# Patient Record
Sex: Male | Born: 1960
Health system: Southern US, Community
[De-identification: ages and names within clinical notes are randomized; demographics above are authoritative.]

## PROBLEM LIST (undated history)

## (undated) DIAGNOSIS — K56609 Unspecified intestinal obstruction, unspecified as to partial versus complete obstruction: Secondary | ICD-10-CM

## (undated) DIAGNOSIS — M1990 Unspecified osteoarthritis, unspecified site: Secondary | ICD-10-CM

## (undated) DIAGNOSIS — I499 Cardiac arrhythmia, unspecified: Secondary | ICD-10-CM

## (undated) DIAGNOSIS — I10 Essential (primary) hypertension: Secondary | ICD-10-CM

## (undated) DIAGNOSIS — J449 Chronic obstructive pulmonary disease, unspecified: Secondary | ICD-10-CM

## (undated) DIAGNOSIS — E119 Type 2 diabetes mellitus without complications: Secondary | ICD-10-CM

## (undated) DIAGNOSIS — F32A Depression, unspecified: Secondary | ICD-10-CM

## (undated) DIAGNOSIS — C61 Malignant neoplasm of prostate: Secondary | ICD-10-CM

## (undated) DIAGNOSIS — I4891 Unspecified atrial fibrillation: Secondary | ICD-10-CM

## (undated) DIAGNOSIS — F329 Major depressive disorder, single episode, unspecified: Secondary | ICD-10-CM

## (undated) HISTORY — PX: EXPLORATORY LAPAROTOMY: SUR591

## (undated) HISTORY — PX: HAND SURGERY: SHX662

## (undated) HISTORY — PX: WRIST SURGERY: SHX841

## (undated) HISTORY — PX: HERNIA REPAIR: SHX51

---

## 2016-03-16 ENCOUNTER — Ambulatory Visit (INDEPENDENT_AMBULATORY_CARE_PROVIDER_SITE_OTHER): Payer: BLUE CROSS/BLUE SHIELD | Admitting: Urology

## 2016-03-16 DIAGNOSIS — N403 Nodular prostate with lower urinary tract symptoms: Secondary | ICD-10-CM | POA: Diagnosis not present

## 2016-03-16 DIAGNOSIS — N3281 Overactive bladder: Secondary | ICD-10-CM

## 2016-03-19 ENCOUNTER — Other Ambulatory Visit: Payer: Self-pay | Admitting: Urology

## 2016-03-19 DIAGNOSIS — N138 Other obstructive and reflux uropathy: Secondary | ICD-10-CM

## 2016-03-19 DIAGNOSIS — N403 Nodular prostate with lower urinary tract symptoms: Secondary | ICD-10-CM

## 2016-03-26 ENCOUNTER — Emergency Department (HOSPITAL_COMMUNITY): Payer: BLUE CROSS/BLUE SHIELD

## 2016-03-26 ENCOUNTER — Emergency Department (HOSPITAL_COMMUNITY)
Admission: EM | Admit: 2016-03-26 | Discharge: 2016-03-26 | Disposition: A | Discharge status: Home / Self Care | Payer: BLUE CROSS/BLUE SHIELD | Attending: Emergency Medicine | Admitting: Emergency Medicine

## 2016-03-26 ENCOUNTER — Encounter (HOSPITAL_COMMUNITY): Payer: Self-pay | Admitting: *Deleted

## 2016-03-26 DIAGNOSIS — E119 Type 2 diabetes mellitus without complications: Secondary | ICD-10-CM | POA: Insufficient documentation

## 2016-03-26 DIAGNOSIS — J441 Chronic obstructive pulmonary disease with (acute) exacerbation: Secondary | ICD-10-CM | POA: Insufficient documentation

## 2016-03-26 DIAGNOSIS — Z7901 Long term (current) use of anticoagulants: Secondary | ICD-10-CM | POA: Insufficient documentation

## 2016-03-26 DIAGNOSIS — Z7984 Long term (current) use of oral hypoglycemic drugs: Secondary | ICD-10-CM | POA: Diagnosis not present

## 2016-03-26 DIAGNOSIS — Z79899 Other long term (current) drug therapy: Secondary | ICD-10-CM | POA: Diagnosis not present

## 2016-03-26 DIAGNOSIS — Z87891 Personal history of nicotine dependence: Secondary | ICD-10-CM | POA: Insufficient documentation

## 2016-03-26 DIAGNOSIS — I10 Essential (primary) hypertension: Secondary | ICD-10-CM | POA: Diagnosis not present

## 2016-03-26 DIAGNOSIS — R0602 Shortness of breath: Secondary | ICD-10-CM | POA: Diagnosis present

## 2016-03-26 HISTORY — DX: Type 2 diabetes mellitus without complications: E11.9

## 2016-03-26 HISTORY — DX: Essential (primary) hypertension: I10

## 2016-03-26 HISTORY — DX: Chronic obstructive pulmonary disease, unspecified: J44.9

## 2016-03-26 LAB — BASIC METABOLIC PANEL
ANION GAP: 4 — AB (ref 5–15)
BUN: 11 mg/dL (ref 6–20)
CALCIUM: 8.7 mg/dL — AB (ref 8.9–10.3)
CO2: 35 mmol/L — ABNORMAL HIGH (ref 22–32)
Chloride: 97 mmol/L — ABNORMAL LOW (ref 101–111)
Creatinine, Ser: 0.77 mg/dL (ref 0.61–1.24)
Glucose, Bld: 139 mg/dL — ABNORMAL HIGH (ref 65–99)
POTASSIUM: 4.3 mmol/L (ref 3.5–5.1)
Sodium: 136 mmol/L (ref 135–145)

## 2016-03-26 LAB — CBC WITH DIFFERENTIAL/PLATELET
BASOS ABS: 0 10*3/uL (ref 0.0–0.1)
BASOS PCT: 0 %
EOS ABS: 0.1 10*3/uL (ref 0.0–0.7)
Eosinophils Relative: 2 %
HCT: 46.6 % (ref 39.0–52.0)
HEMOGLOBIN: 15.3 g/dL (ref 13.0–17.0)
Lymphocytes Relative: 52 %
Lymphs Abs: 3 10*3/uL (ref 0.7–4.0)
MCH: 29.6 pg (ref 26.0–34.0)
MCHC: 32.8 g/dL (ref 30.0–36.0)
MCV: 90.1 fL (ref 78.0–100.0)
Monocytes Absolute: 0.4 10*3/uL (ref 0.1–1.0)
Monocytes Relative: 6 %
NEUTROS ABS: 2.3 10*3/uL (ref 1.7–7.7)
NEUTROS PCT: 40 %
Platelets: 107 10*3/uL — ABNORMAL LOW (ref 150–400)
RBC: 5.17 MIL/uL (ref 4.22–5.81)
RDW: 15.3 % (ref 11.5–15.5)
WBC: 5.9 10*3/uL (ref 4.0–10.5)

## 2016-03-26 LAB — PROTIME-INR
INR: 2.12
PROTHROMBIN TIME: 24.1 s — AB (ref 11.4–15.2)

## 2016-03-26 LAB — TROPONIN I: Troponin I: <0.03 ng/mL (ref <0.03)

## 2016-03-26 MED ORDER — ALBUTEROL SULFATE (2.5 MG/3ML) 0.083% IN NEBU
10.0000 mg | INHALATION_SOLUTION | Freq: Once | RESPIRATORY_TRACT | Status: AC
Start: 2016-03-26 — End: 2016-03-26
  Administered 2016-03-26: 10 mg via RESPIRATORY_TRACT

## 2016-03-26 MED ORDER — METHYLPREDNISOLONE SODIUM SUCC 125 MG IJ SOLR
125.0000 mg | Freq: Once | INTRAMUSCULAR | Status: AC
  Administered 2016-03-26: 125 mg via INTRAVENOUS
  Filled 2016-03-26: qty 2

## 2016-03-26 MED ORDER — PREDNISONE 20 MG PO TABS: 40.0000 mg | ORAL_TABLET | Freq: Every day | ORAL | 0 refills | Status: DC

## 2016-03-26 MED ORDER — ALBUTEROL (5 MG/ML) CONTINUOUS INHALATION SOLN: 10.0000 mg/h | INHALATION_SOLUTION | Freq: Once | RESPIRATORY_TRACT | Status: DC

## 2016-03-26 MED ORDER — ALBUTEROL SULFATE (2.5 MG/3ML) 0.083% IN NEBU
INHALATION_SOLUTION | RESPIRATORY_TRACT | Status: AC
  Administered 2016-03-26: 10 mg via RESPIRATORY_TRACT
  Filled 2016-03-26: qty 12

## 2016-03-26 MED ORDER — IPRATROPIUM BROMIDE 0.02 % IN SOLN
1.0000 mg | Freq: Once | RESPIRATORY_TRACT | Status: AC
  Administered 2016-03-26: 1 mg via RESPIRATORY_TRACT
  Filled 2016-03-26: qty 5

## 2016-03-26 NOTE — ED Notes (Signed)
Pt is refusing chest x-ray. 

## 2016-03-26 NOTE — ED Triage Notes (Addendum)
Pt c/o SOB that has been worsening over the last few days. Pt has hx of COPD. Also c/o congested cough. PT reports the SOB causes him to have panic attacks.Pt's O2 sat 88% on RA in triage. Pt was seen at [facility] ED last night and he said he left on his own without being d/c.

## 2016-03-26 NOTE — ED Notes (Signed)
Dr Mcmanus at bedside 

## 2016-03-26 NOTE — ED Notes (Addendum)
Pt anxious. Asking how long before neb will be completed. States wants papers ready so he can be discharged. Pt informed that would be up to dr regarding . Sat 90 % on room air. Conts to refuse CXR stated he had one last night, but doesn't know results

## 2016-03-26 NOTE — ED Provider Notes (Signed)
[facility] DEPT Provider Note   CSN: [id number] Arrival date & time: 03/26/16  [id number]     History   Chief Complaint Chief Complaint  Patient presents with  . Shortness of Breath    HPI Dale Franklin is a 56 y.o. male.   Shortness of Breath     Pt was seen at 0950.  Per pt, c/o gradual onset and worsening of persistent cough, wheezing and SOB for the past 1 week.  Describes his symptoms as "my COPD is acting up."  Has been using home MDI with transient relief.  Denies CP/palpitations, no back pain, no abd pain, no N/V/D, no fevers, no rash.    Past Medical History:  Diagnosis Date  . COPD (chronic obstructive pulmonary disease) ([place])   . Diabetes mellitus without complication ([place])   . Hypertension     There are no active problems to display for this patient.   Past Surgical History:  Procedure Laterality Date  . HAND SURGERY Right   . HERNIA REPAIR    . WRIST SURGERY Right        Home Medications    Prior to Admission medications   Medication Sig Start Date End Date Taking? Authorizing Provider  clotrimazole-betamethasone (LOTRISONE) cream Apply 1 application topically 2 (two) times daily. 03/23/16  Yes Historical Provider, MD  DULoxetine (CYMBALTA) 30 MG capsule Take 3 capsules by mouth daily.  03/23/16  Yes Historical Provider, MD  lisinopril-hydrochlorothiazide (PRINZIDE,ZESTORETIC) 10-12.5 MG tablet Take 1 tablet by mouth daily. 03/23/16  Yes Historical Provider, MD  Melatonin 3 MG TABS Take 1 tablet by mouth at bedtime. 03/04/16  Yes Historical Provider, MD  metFORMIN (GLUCOPHAGE-XR) 500 MG 24 hr tablet Take 1 tablet by mouth 2 (two) times daily. 03/23/16  Yes Historical Provider, MD  methocarbamol (ROBAXIN) 500 MG tablet Take 500 mg by mouth at bedtime. 03/23/16  Yes Historical Provider, MD  metoprolol succinate (TOPROL-XL) 25 MG 24 hr tablet Take 1 tablet by mouth daily. 03/23/16  Yes Historical Provider, MD  mirabegron ER (MYRBETRIQ) 25 MG TB24 tablet Take 25 mg  by mouth daily.   Yes Historical Provider, MD  Saw Palmetto, Serenoa repens, (SAW PALMETTO PO) Take 1,200 mg by mouth daily. 01/16/16  Yes Historical Provider, MD  tamsulosin (FLOMAX) 0.4 MG CAPS capsule Take 0.4 mg by mouth at bedtime. 03/23/16  Yes Historical Provider, MD  traZODone (DESYREL) 100 MG tablet Take 1 tablet by mouth at bedtime. 03/23/16  Yes Historical Provider, MD  VENTOLIN HFA 108 (90 Base) MCG/ACT inhaler Inhale 1-2 puffs into the lungs every 4 (four) hours as needed. 03/03/16  Yes Historical Provider, MD  warfarin (COUMADIN) 10 MG tablet Take 1 tablet by mouth daily. 03/23/16  Yes Historical Provider, MD  warfarin (COUMADIN) 2.5 MG tablet Take 1 tablet by mouth daily. 03/23/16  Yes Historical Provider, MD  levofloxacin (LEVAQUIN) 500 MG tablet Take 500 mg by mouth daily. 03/23/16   Historical Provider, MD    Family History No family history on file.  Social History Social History  Substance Use Topics  . Smoking status: Former Research scientist (life sciences)  . Smokeless tobacco: Never Used  . Alcohol use Yes     Comment: occasionally     Allergies   Patient has no known allergies.   Review of Systems Review of Systems  Respiratory: Positive for shortness of breath.   ROS: Statement: All systems negative except as marked or noted in the HPI; Constitutional: Negative for fever and chills. ; ; Eyes: Negative for  eye pain, redness and discharge. ; ; ENMT: Negative for ear pain, hoarseness, nasal congestion, sinus pressure and sore throat. ; ; Cardiovascular: Negative for chest pain, palpitations, diaphoresis, and peripheral edema. ; ; Respiratory: +cough, wheezing, SOB. Negative for stridor. ; ; Gastrointestinal: Negative for nausea, vomiting, diarrhea, abdominal pain, blood in stool, hematemesis, jaundice and rectal bleeding. . ; ; Genitourinary: Negative for dysuria, flank pain and hematuria. ; ; Musculoskeletal: Negative for back pain and neck pain. Negative for swelling and trauma.; ; Skin: Negative for  pruritus, rash, abrasions, blisters, bruising and skin lesion.; ; Neuro: Negative for headache, lightheadedness and neck stiffness. Negative for weakness, altered level of consciousness, altered mental status, extremity weakness, paresthesias, involuntary movement, seizure and syncope.        Physical Exam Updated Vital Signs BP (!) 171/114   Pulse 95   Temp 97.7 F (36.5 C) (Oral)   Resp 26   Ht 6\' 3"  (1.905 m)   Wt (!) 330 lb (149.7 kg)   SpO2 94%   BMI 41.25 kg/m    Patient Vitals for the past 24 hrs:  BP Temp Temp src Pulse Resp SpO2 Height Weight  03/26/16 1115 - - - - 26 - - -  03/26/16 1100 (!) 171/114 - - 95 17 94 % - -  03/26/16 1035 - - - - - 94 % - -  03/26/16 1030 (!) 164/110 - - 91 15 94 % - -  03/26/16 0953 - 97.7 F (36.5 C) Oral - - - - -  03/26/16 0951 (!) 182/116 - - 96 24 (!) 88 % - -  03/26/16 0945 (!) 182/116 - - 96 16 (!) 88 % - -  03/26/16 0940 - - - - - - 6\' 3"  (1.905 m) (!) 330 lb (149.7 kg)     Physical Exam 0955: Physical examination:  Nursing notes reviewed; Vital signs and O2 SAT reviewed;  Constitutional: Well developed, Well nourished, Well hydrated, Uncomfortable appearing.;; Head:  Normocephalic, atraumatic; Eyes: EOMI, PERRL, No scleral icterus; ENMT: Mouth and pharynx normal, Mucous membranes moist; Neck: Supple, Full range of motion, No lymphadenopathy; Cardiovascular: Tachycardic rate and rhythm, No gallop; Respiratory: Breath sounds diminished & equal bilaterally, insp/exp wheezes bilat. Speaking short sentences, sitting upright, tachypneic.; Chest: Nontender, Movement normal; Abdomen: Soft, Nontender, Nondistended, Normal bowel sounds; Genitourinary: No CVA tenderness; Extremities: Pulses normal, No tenderness, No edema, No calf edema or asymmetry.; Neuro: AA&Ox3, Major CN grossly intact.  Speech clear. No gross focal motor or sensory deficits in extremities.; Skin: Color normal, Warm, Dry.   ED Treatments / Results  Labs (all labs  ordered are listed, but only abnormal results are displayed)   EKG  EKG Interpretation  Date/Time:  Friday March 26 2016 09:48:21 EST Ventricular Rate:  96 PR Interval:    QRS Duration: 151 QT Interval:  425 QTC Calculation: 526 R Axis:   73 Text Interpretation:  Atrial flutter Right bundle branch block ST depr, consider ischemia, inferior leads Baseline wander No old tracing to compare Confirmed by [facility]  MD, Nunzio Cory [phone]) on 03/26/2016 11:33:50 AM       Radiology   Procedures Procedures (including critical care time)  Medications Ordered in ED Medications  methylPREDNISolone sodium succinate (SOLU-MEDROL) 125 mg/2 mL injection 125 mg (125 mg Intravenous Given 03/26/16 1037)  ipratropium (ATROVENT) nebulizer solution 1 mg (1 mg Nebulization Given 03/26/16 1036)  albuterol (PROVENTIL) (2.5 MG/3ML) 0.083% nebulizer solution 10 mg (10 mg Nebulization Given 03/26/16 1035)  Initial Impression / Assessment and Plan / ED Course  I have reviewed the triage vital signs and the nursing notes.  Pertinent labs & imaging results that were available during my care of the patient were reviewed by me and considered in my medical decision making (see chart for details).  MDM Reviewed: nursing note and vitals Interpretation: labs, ECG and x-ray Total time providing critical care: 30-74 minutes. This excludes time spent performing separately reportable procedures and services.   CRITICAL CARE Performed by: Alfonzo Feller Total critical care time: 35 minutes Critical care time was exclusive of separately billable procedures and treating other patients. Critical care was necessary to treat or prevent imminent or life-threatening deterioration. Critical care was time spent personally by me on the following activities: development of treatment plan with patient and/or surrogate as well as nursing, discussions with consultants, evaluation of patient's response to treatment,  examination of patient, obtaining history from patient or surrogate, ordering and performing treatments and interventions, ordering and review of laboratory studies, ordering and review of radiographic studies, pulse oximetry and re-evaluation of patient's condition.  Results for orders placed or performed during the hospital encounter of [id number]  Basic metabolic panel  Result Value Ref Range   Sodium 136 135 - 145 mmol/L   Potassium 4.3 3.5 - 5.1 mmol/L   Chloride 97 (L) 101 - 111 mmol/L   CO2 35 (H) 22 - 32 mmol/L   Glucose, Bld 139 (H) 65 - 99 mg/dL   BUN 11 6 - 20 mg/dL   Creatinine, Ser 0.77 0.61 - 1.24 mg/dL   Calcium 8.7 (L) 8.9 - 10.3 mg/dL   GFR calc non Af Amer >60 >60 mL/min   GFR calc Af Amer >60 >60 mL/min   Anion gap 4 (L) 5 - 15  Troponin I  Result Value Ref Range   Troponin I <0.03 <0.03 ng/mL  CBC with Differential  Result Value Ref Range   WBC 5.9 4.0 - 10.5 K/uL   RBC 5.17 4.22 - 5.81 MIL/uL   Hemoglobin 15.3 13.0 - 17.0 g/dL   HCT 46.6 39.0 - 52.0 %   MCV 90.1 78.0 - 100.0 fL   MCH 29.6 26.0 - 34.0 pg   MCHC 32.8 30.0 - 36.0 g/dL   RDW 15.3 11.5 - 15.5 %   Platelets 107 (L) 150 - 400 K/uL   Neutrophils Relative % 40 %   Neutro Abs 2.3 1.7 - 7.7 K/uL   Lymphocytes Relative 52 %   Lymphs Abs 3.0 0.7 - 4.0 K/uL   Monocytes Relative 6 %   Monocytes Absolute 0.4 0.1 - 1.0 K/uL   Eosinophils Relative 2 %   Eosinophils Absolute 0.1 0.0 - 0.7 K/uL   Basophils Relative 0 %   Basophils Absolute 0.0 0.0 - 0.1 K/uL    1130:  On arrival: pt sitting upright, tachypneic, tachycardic, Sats 88 % R/A, lungs diminished. IV solumedrol and hour long neb started. After neb: pt sitting off side of stretcher, coughing, Sats 90% on R/A. Pt now refusing any further care in the ED and wants to leave.  Now also reveals to ED staff that he was at [facility] ED yesterday for this same complaint "and had stuff done there but I left there too." Unclear if he was rx prednisone.  Pt does state he has "enough" MDI.  Pt informed re: dx testing results and continued wheezing, and that I recommend further evaluation in the ED and admission.  Pt refuses any further  treatment in the ED, as well admission, and wants to "leave right now."  ED RN and I encouraged pt to stay, continues to refuse.  Pt makes his own medical decisions.  Risks of AMA explained to pt, including, but not limited to:  stroke, heart attack, cardiac arrythmia ("irregular heart rate/beat"), "passing out," temporary and/or permanent disability, death.  Pt verb understanding and continues to refuse any further care in the ED, as well as possible admission, understanding the consequences of his decision.  I encouraged pt to follow up with his PMD tomorrow and return to the ED immediately if symptoms worsen, he changes his mind, or for any other concerns.  Pt verb understanding, agreeable.   Final Clinical Impressions(s) / ED Diagnoses   Final diagnoses:  None    New Prescriptions New Prescriptions   No medications on file      Francine Graven, DO 03/30/16 [id number]

## 2016-03-26 NOTE — ED Notes (Signed)
Pt reports that he is leaving and refuses any other treatment. States he is 56 yo and can leave if he wants too

## 2016-03-26 NOTE — Discharge Instructions (Signed)
Take the prescription as directed.  Use your albuterol inhaler (2 to 4 puffs) or your albuterol nebulizer (1 unit dose) every 4 hours for the next 7 days, then as needed for cough, wheezing, or shortness of breath.  Call your regular medical doctor this morning to schedule a follow up appointment within the next 1 days.  Return to the Emergency Department immediately sooner if worsening.

## 2016-04-06 ENCOUNTER — Ambulatory Visit (HOSPITAL_COMMUNITY)
Admission: RE | Admit: 2016-04-06 | Discharge: 2016-04-06 | Disposition: A | Discharge status: Home / Self Care | Payer: BLUE CROSS/BLUE SHIELD | Source: Ambulatory Visit | Attending: Urology | Admitting: Urology

## 2016-04-06 DIAGNOSIS — N403 Nodular prostate with lower urinary tract symptoms: Secondary | ICD-10-CM

## 2016-04-06 DIAGNOSIS — C61 Malignant neoplasm of prostate: Secondary | ICD-10-CM | POA: Diagnosis present

## 2016-04-06 DIAGNOSIS — R972 Elevated prostate specific antigen [PSA]: Secondary | ICD-10-CM | POA: Diagnosis not present

## 2016-04-06 MED ORDER — LIDOCAINE HCL (PF) 2 % IJ SOLN
INTRAMUSCULAR | Status: AC
  Administered 2016-04-06: 10 mL
  Filled 2016-04-06: qty 10

## 2016-04-06 MED ORDER — GENTAMICIN SULFATE 40 MG/ML IJ SOLN
INTRAMUSCULAR | Status: AC
  Filled 2016-04-06: qty 4

## 2016-04-06 MED ORDER — LIDOCAINE HCL (PF) 2 % IJ SOLN
10.0000 mL | Freq: Once | INTRAMUSCULAR | Status: AC
  Administered 2016-04-06: 10 mL

## 2016-04-06 MED ORDER — GENTAMICIN SULFATE 40 MG/ML IJ SOLN
160.0000 mg | Freq: Once | INTRAMUSCULAR | Status: AC
  Administered 2016-04-06: 160 mg via INTRAMUSCULAR

## 2016-04-06 NOTE — Discharge Instructions (Signed)
Transrectal Ultrasound-Guided Biopsy °A transrectal ultrasound-guided biopsy is a procedure to take samples of tissue from your prostate. Ultrasound images are used to guide the procedure. It is usually done to check the prostate gland for cancer. °What happens before the procedure? °· Do not eat or drink after midnight on the night before your procedure. °· Take medicines as your doctor tells you. °· Your doctor may have you stop taking some medicines 5-7 days before the procedure. °· You will be given an enema before your procedure. During an enema, a liquid is put into your butt (rectum) to clear out waste. °· You may have lab tests the day of your procedure. °· Make plans to have someone drive you home. °What happens during the procedure? °· You will be given medicine to help you relax before the procedure. An IV tube will be put into one of your veins. It will be used to give fluids and medicine. °· You will be given medicine to reduce the risk of infection (antibiotic). °· You will be placed on your side. °· A probe with gel will be put in your butt. This is used to take pictures of your prostate and the area around it. °· A medicine to numb the area is put into your prostate. °· A biopsy needle is then inserted and guided to your prostate. °· Samples of prostate tissue are taken. The needle is removed. °· The samples are sent to a lab to be checked. Results are usually back in 2-3 days. °What happens after the procedure? °· You will be taken to a room where you will be watched until you are doing okay. °· You may have some pain in the area around your butt. You will be given medicines for this. °· You may be able to go home the same day. Sometimes, an overnight stay in the hospital is needed. °This information is not intended to replace advice given to you by your health care provider. Make sure you discuss any questions you have with your health care provider. °Document Released: 01/20/2009 Document Revised:  07/10/2015 Document Reviewed: 09/20/2012 °Elsevier Interactive Patient Education © 2017 [employer] ° °

## 2016-04-09 ENCOUNTER — Other Ambulatory Visit (HOSPITAL_COMMUNITY)
Admission: AD | Admit: 2016-04-09 | Discharge: 2016-04-09 | Disposition: A | Discharge status: Home / Self Care | Payer: BLUE CROSS/BLUE SHIELD | Source: Other Acute Inpatient Hospital | Attending: Urology | Admitting: Urology

## 2016-04-09 ENCOUNTER — Ambulatory Visit (INDEPENDENT_AMBULATORY_CARE_PROVIDER_SITE_OTHER): Payer: BLUE CROSS/BLUE SHIELD | Admitting: Urology

## 2016-04-09 DIAGNOSIS — R3914 Feeling of incomplete bladder emptying: Secondary | ICD-10-CM

## 2016-04-09 DIAGNOSIS — C61 Malignant neoplasm of prostate: Secondary | ICD-10-CM | POA: Diagnosis not present

## 2016-04-09 LAB — URINALYSIS, ROUTINE W REFLEX MICROSCOPIC
Bilirubin Urine: NEGATIVE
GLUCOSE, UA: NEGATIVE mg/dL
Hgb urine dipstick: NEGATIVE
Ketones, ur: NEGATIVE mg/dL
LEUKOCYTES UA: NEGATIVE
Nitrite: NEGATIVE
PROTEIN: NEGATIVE mg/dL
Specific Gravity, Urine: 1.023 (ref 1.005–1.030)
pH: 5 (ref 5.0–8.0)

## 2016-04-11 LAB — URINE CULTURE: Culture: NO GROWTH

## 2016-04-13 ENCOUNTER — Other Ambulatory Visit: Payer: Self-pay | Admitting: Urology

## 2016-04-13 DIAGNOSIS — C61 Malignant neoplasm of prostate: Secondary | ICD-10-CM

## 2016-04-15 ENCOUNTER — Encounter (HOSPITAL_COMMUNITY)
Admission: RE | Admit: 2016-04-15 | Discharge: 2016-04-15 | Disposition: A | Discharge status: Home / Self Care | Payer: BLUE CROSS/BLUE SHIELD | Source: Ambulatory Visit | Attending: Urology | Admitting: Urology

## 2016-04-15 ENCOUNTER — Encounter (HOSPITAL_COMMUNITY): Payer: Self-pay

## 2016-04-15 DIAGNOSIS — C61 Malignant neoplasm of prostate: Secondary | ICD-10-CM | POA: Insufficient documentation

## 2016-04-15 MED ORDER — TECHNETIUM TC 99M MEDRONATE IV KIT
25.0000 | PACK | Freq: Once | INTRAVENOUS | Status: AC | PRN
  Administered 2016-04-15: 21 via INTRAVENOUS

## 2016-04-20 ENCOUNTER — Ambulatory Visit (HOSPITAL_COMMUNITY)
Admission: RE | Admit: 2016-04-20 | Discharge: 2016-04-20 | Disposition: A | Discharge status: Home / Self Care | Payer: BLUE CROSS/BLUE SHIELD | Source: Ambulatory Visit | Attending: Urology | Admitting: Urology

## 2016-04-20 DIAGNOSIS — I700 Atherosclerosis of aorta: Secondary | ICD-10-CM | POA: Diagnosis not present

## 2016-04-20 DIAGNOSIS — C61 Malignant neoplasm of prostate: Secondary | ICD-10-CM | POA: Insufficient documentation

## 2016-04-20 DIAGNOSIS — R9721 Rising PSA following treatment for malignant neoplasm of prostate: Secondary | ICD-10-CM | POA: Diagnosis not present

## 2016-04-20 DIAGNOSIS — K439 Ventral hernia without obstruction or gangrene: Secondary | ICD-10-CM | POA: Insufficient documentation

## 2016-04-20 DIAGNOSIS — R590 Localized enlarged lymph nodes: Secondary | ICD-10-CM | POA: Diagnosis not present

## 2016-04-20 MED ORDER — IOPAMIDOL (ISOVUE-300) INJECTION 61%
100.0000 mL | Freq: Once | INTRAVENOUS | Status: AC | PRN
  Administered 2016-04-20: 100 mL via INTRAVENOUS

## 2016-04-27 ENCOUNTER — Ambulatory Visit (INDEPENDENT_AMBULATORY_CARE_PROVIDER_SITE_OTHER): Payer: BLUE CROSS/BLUE SHIELD | Admitting: Urology

## 2016-04-27 DIAGNOSIS — C61 Malignant neoplasm of prostate: Secondary | ICD-10-CM

## 2016-04-27 DIAGNOSIS — R3914 Feeling of incomplete bladder emptying: Secondary | ICD-10-CM | POA: Diagnosis not present

## 2016-04-30 ENCOUNTER — Telehealth: Payer: Self-pay | Admitting: Medical Oncology

## 2016-04-30 NOTE — Progress Notes (Signed)
Left a message with Becky Augusta- friend requesting a return call to discuss patient's referral to the [facility].

## 2016-05-04 ENCOUNTER — Encounter: Payer: Self-pay | Admitting: Medical Oncology

## 2016-05-04 ENCOUNTER — Telehealth: Payer: Self-pay | Admitting: Medical Oncology

## 2016-05-04 NOTE — Progress Notes (Signed)
Called [facility] to request pathology slides for [facility]state [facility].

## 2016-05-04 NOTE — Progress Notes (Signed)
Spoke with Kim-friend of Mr. Perz. She states that she and her husband help him with medical appointments and offer support to him due to being disabled. I called pt to introduce myself as the Prostate Nurse Navigator and the Coordinator of the Prostate [place].   I confirmed  his referral to the clinic 05/11/16 arriving at 12:30 pm. I discussed the format of the clinic and the physicians he will be seeing that day. I discussed where the clinic is located and how to contact me.  I confirmed his address and informed him I would be mailing a packet of information and forms to be completed. I asked him to bring them with him the day of his appointment.   She voiced understanding of the above. I asked her to call me she  has any questions or concerns regarding his appointments or the forms he needs to complete. I will also call Mr. Demas to inform him of the above.

## 2016-05-04 NOTE — Progress Notes (Signed)
I called pt to introduce myself as the Prostate Nurse Navigator and the Coordinator of the Prostate [place].  1. I confirmed with the patient he is aware of his referral to the clinic March 27,2018 arriving at 12:30 pm.  2. I discussed the format of the clinic and the physicians he will be seeing that day.  3. I discussed where the clinic is located and how to contact me.  4. I confirmed his address and informed him I would be mailing a packet of information and forms to be completed. I asked him to bring them with him the day of his appointment.   He voiced understanding of the above. I asked him to call me if he has any questions or concerns regarding his appointments or the forms he needs to complete.

## 2016-05-10 ENCOUNTER — Telehealth: Payer: Self-pay | Admitting: Medical Oncology

## 2016-05-10 NOTE — Progress Notes (Signed)
Confirmed appointment for [facility]state [facility] 05/11/16 arriving at 12:30 pm. I reminded him to bring his completed medical forms and to have lunch before arrival. He voiced understanding.

## 2016-05-11 ENCOUNTER — Encounter: Payer: Self-pay | Admitting: Radiation Oncology

## 2016-05-11 ENCOUNTER — Ambulatory Visit (HOSPITAL_BASED_OUTPATIENT_CLINIC_OR_DEPARTMENT_OTHER): Payer: BLUE CROSS/BLUE SHIELD | Admitting: Oncology

## 2016-05-11 ENCOUNTER — Ambulatory Visit
Admission: RE | Admit: 2016-05-11 | Discharge: 2016-05-11 | Disposition: A | Discharge status: Home / Self Care | Payer: BLUE CROSS/BLUE SHIELD | Source: Ambulatory Visit | Attending: Radiation Oncology | Admitting: Radiation Oncology

## 2016-05-11 ENCOUNTER — Encounter: Payer: Self-pay | Admitting: General Practice

## 2016-05-11 ENCOUNTER — Encounter: Payer: Self-pay | Admitting: Medical Oncology

## 2016-05-11 DIAGNOSIS — F329 Major depressive disorder, single episode, unspecified: Secondary | ICD-10-CM | POA: Insufficient documentation

## 2016-05-11 DIAGNOSIS — Z8042 Family history of malignant neoplasm of prostate: Secondary | ICD-10-CM | POA: Insufficient documentation

## 2016-05-11 DIAGNOSIS — Z833 Family history of diabetes mellitus: Secondary | ICD-10-CM | POA: Diagnosis not present

## 2016-05-11 DIAGNOSIS — Z87891 Personal history of nicotine dependence: Secondary | ICD-10-CM | POA: Insufficient documentation

## 2016-05-11 DIAGNOSIS — I4891 Unspecified atrial fibrillation: Secondary | ICD-10-CM | POA: Diagnosis not present

## 2016-05-11 DIAGNOSIS — G4730 Sleep apnea, unspecified: Secondary | ICD-10-CM | POA: Diagnosis not present

## 2016-05-11 DIAGNOSIS — K219 Gastro-esophageal reflux disease without esophagitis: Secondary | ICD-10-CM | POA: Diagnosis not present

## 2016-05-11 DIAGNOSIS — I10 Essential (primary) hypertension: Secondary | ICD-10-CM | POA: Diagnosis not present

## 2016-05-11 DIAGNOSIS — Z7984 Long term (current) use of oral hypoglycemic drugs: Secondary | ICD-10-CM | POA: Insufficient documentation

## 2016-05-11 DIAGNOSIS — J449 Chronic obstructive pulmonary disease, unspecified: Secondary | ICD-10-CM | POA: Insufficient documentation

## 2016-05-11 DIAGNOSIS — R339 Retention of urine, unspecified: Secondary | ICD-10-CM | POA: Diagnosis not present

## 2016-05-11 DIAGNOSIS — E119 Type 2 diabetes mellitus without complications: Secondary | ICD-10-CM | POA: Diagnosis not present

## 2016-05-11 DIAGNOSIS — C61 Malignant neoplasm of prostate: Secondary | ICD-10-CM | POA: Insufficient documentation

## 2016-05-11 DIAGNOSIS — Z7901 Long term (current) use of anticoagulants: Secondary | ICD-10-CM | POA: Insufficient documentation

## 2016-05-11 DIAGNOSIS — Z809 Family history of malignant neoplasm, unspecified: Secondary | ICD-10-CM | POA: Diagnosis not present

## 2016-05-11 HISTORY — DX: Malignant neoplasm of prostate: C61

## 2016-05-11 NOTE — Progress Notes (Signed)
GU Location of Tumor / Histology: prostatic adenocarcinoma  If Prostate Cancer, Gleason Score is (4 + 5) and PSA is (20.4) as of 03/16/16  Dale Franklin presented months ago with signs/symptoms of:    Biopsies of prostate (if applicable) revealed:    Past/Anticipated interventions by urology, if any: biopsy, catheter, taught to self catheterize, referred to [facility]  Past/Anticipated interventions by medical oncology, if any: no  Weight changes, if any: no  Bowel/Bladder complaints, if any: urinary retention, urinary leakage, urinary frequency, hard to postpone urination, dysuria, must strain to urinate, ED   Nausea/Vomiting, if any: no  Pain issues, if any:  no  SAFETY ISSUES:  Prior radiation? no  Pacemaker/ICD? no  Possible current pregnancy? no  Is the patient on methotrexate? no  Current Complaints / other details:  56 year old male. Divorced.   Surgery not recommended due to prior abdominal surgery and obesity.

## 2016-05-11 NOTE — Progress Notes (Signed)
Reason for Referral: Prostate cancer.   HPI: 56 year old gentleman currently of [place], [place] where he lived the majority of his life. He has history of obesity, COPD, diabetes among other comorbid conditions. He has been disabled after he sustained a fall from a ladder. He has had history with urinary retention in the past and have had a Foley catheter placed on a few occasions. He was found to have a elevated PSA up to 20.4. He underwent a biopsy by Dr. Diona Fanti on February 2018. His prostate biopsy showed a Gleason score 4+5 = 9 in 4 cores with a Gleason score 4+4 = 8 in 3 other cores. He continued to be in retention after his biopsy and subsequently support catheter has been removed. He currently intermittently catheterized symptoms himself. Staging workup including a bone scan and a CT scan did not really show clear-cut evidence of metastatic disease. He does have borderline enlarged lymph glands in the pelvic area although do not meet the criteria for pathological enlargement. His complaints is predominantly related to urinary retention and difficulty catheterizing himself. He has a lot of discomfort and pressure in the pelvic area and does not report any hematuria or dysuria. His mobility is limited because of arthritis as well as obesity in his performance status is rather limited.  He does not report any headaches, blurry vision, syncope or seizures. He does not report any fevers, chills or sweats. He does not report any cough, wheezing or hemoptysis. He does not report any nausea, vomiting or abdominal pain. He is not report any frequency urgency or hesitancy. He has not reported any skeletal complaints. Remaining review of systems unremarkable.   Past Medical History:  Diagnosis Date  . COPD (chronic obstructive pulmonary disease) ([place])   . Diabetes mellitus without complication ([place])   . Hypertension   . Prostate cancer [facility])   :  Past Surgical History:  Procedure Laterality Date   . HAND SURGERY Right   . HERNIA REPAIR    . WRIST SURGERY Right   :   Current Outpatient Prescriptions:  .  amoxicillin-clavulanate (AUGMENTIN) 500-125 MG tablet, Take 1 tablet by mouth every 12 (twelve) hours., Disp: , Rfl: 0 .  clotrimazole-betamethasone (LOTRISONE) cream, Apply 1 application topically 2 (two) times daily., Disp: , Rfl: 0 .  DULoxetine (CYMBALTA) 30 MG capsule, Take 3 capsules by mouth daily. , Disp: , Rfl: 5 .  lisinopril-hydrochlorothiazide (PRINZIDE,ZESTORETIC) 10-12.5 MG tablet, Take 1 tablet by mouth daily., Disp: , Rfl: 12 .  Melatonin 3 MG TABS, Take 1 tablet by mouth at bedtime., Disp: , Rfl: 3 .  metFORMIN (GLUCOPHAGE-XR) 500 MG 24 hr tablet, Take 1 tablet by mouth 2 (two) times daily., Disp: , Rfl: 3 .  methocarbamol (ROBAXIN) 500 MG tablet, Take 500 mg by mouth at bedtime., Disp: , Rfl: 3 .  metoprolol succinate (TOPROL-XL) 25 MG 24 hr tablet, Take 1 tablet by mouth daily., Disp: , Rfl: 3 .  mirabegron ER (MYRBETRIQ) 25 MG TB24 tablet, Take 25 mg by mouth daily., Disp: , Rfl:  .  oxybutynin (DITROPAN) 5 MG tablet, TAKE ONE TABLET BY MOUTH EVERY 8 HOURS AS NEEDED FOR FOR BLADDER SPASMS AND BURNING., Disp: , Rfl: 11 .  predniSONE (DELTASONE) 20 MG tablet, Take 2 tablets (40 mg total) by mouth daily., Disp: 10 tablet, Rfl: 0 .  Saw Palmetto, Serenoa repens, (SAW PALMETTO PO), Take 1,200 mg by mouth daily., Disp: , Rfl: 3 .  tamsulosin (FLOMAX) 0.4 MG CAPS capsule, Take  0.4 mg by mouth at bedtime., Disp: , Rfl: 5 .  traZODone (DESYREL) 100 MG tablet, Take 1 tablet by mouth at bedtime., Disp: , Rfl: 3 .  VENTOLIN HFA 108 (90 Base) MCG/ACT inhaler, Inhale 1-2 puffs into the lungs every 4 (four) hours as needed., Disp: , Rfl: 1:  No Known Allergies:  Family History  Problem Relation Age of Onset  . Diabetes Mother   . Cancer Father   . Cancer Paternal Uncle     prostate  :  Social History   Social History  . Marital status: Divorced    Spouse name: N/A   . Number of children: N/A  . Years of education: N/A   Occupational History  . Not on file.   Social History Main Topics  . Smoking status: Former Research scientist (life sciences)  . Smokeless tobacco: Never Used  . Alcohol use Yes     Comment: occasionally  . Drug use: No  . Sexual activity: Not on file   Other Topics Concern  . Not on file   Social History Narrative  . No narrative on file  :  Pertinent items are noted in HPI.  Exam: ECOG 2 General appearance: Alert, awake obese gentleman appeared without distress. Throat: No oral thrush or ulcers. Neck: no adenopathy Back: negative Resp: clear to auscultation bilaterally Chest wall: no tenderness Cardio: regular rate and rhythm, S1, S2 normal, no murmur, click, rub or gallop GI: soft, non-tender; bowel sounds normal; no masses,  no organomegaly Extremities: extremities normal, atraumatic, no cyanosis or edema Pulses: 2+ and symmetric  *  Ct Pelvis W Contrast  Result Date: 04/20/2016 CLINICAL DATA:  New diagnosis of prostate cancer. Difficulty with urination. EXAM: CT PELVIS WITH CONTRAST TECHNIQUE: Multidetector CT imaging of the pelvis was performed using the standard protocol following the bolus administration of intravenous contrast. CONTRAST:  177mL ISOVUE-300 IOPAMIDOL (ISOVUE-300) INJECTION 61% COMPARISON:  12/24/2015 FINDINGS: Urinary Tract: Urinary bladder is collapsed around a Foley catheter. Bowel: No pathologic dilatation of the small or large bowel loops. There are 2 ventral abdominal wall hernias which contain nonobstructed loops of small bowel, image 19 of series 2 and image 25 of series 2. Vascular/Lymphatic: Aortic atherosclerosis. There is a right common iliac lymph node which measures 11 mm, image 21 of series 2. Previously 9 mm. There is a right external iliac node which measures 8 mm, image number 35 of series 2. Previously this measured the same. Reproductive:  No mass or other significant abnormality Other:  None.  Musculoskeletal: No suspicious bone lesions identified. IMPRESSION: 1. Borderline enlarged right common iliac lymph node measures 11 mm. On the previous exam this measured 9 mm. 2. Ventral abdominal wall hernias are identified. Two of these contain nonobstructed loops of small bowel. 3. Aortic atherosclerosis Electronically Signed   By: Kerby Moors M.D.   On: 04/20/2016 09:59   Nm Bone Scan Whole Body  Result Date: 04/15/2016 CLINICAL DATA:  Recent diagnosis of prostate carcinoma, some right leg pain currently, previous trauma 2 years ago from a fall EXAM: [place]: Whole body anterior and posterior images were obtained approximately 3 hours after intravenous injection of radiopharmaceutical. RADIOPHARMACEUTICALS:  21.0 mCi Technetium-35m MDP IV COMPARISON:  None. FINDINGS: No abnormal uptake of the radionuclide is seen throughout the skeleton other than probable degenerative change within the right knee. Contamination is noted in the midline of the pelvis. IMPRESSION: No evidence of bone metastasis. Probable degenerative change in the right knee. Electronically  Signed   By: Ivar Drape M.D.   On: 04/15/2016 13:43    Assessment and Plan:   56 year old gentleman with prostate cancer diagnosed on a biopsy in February 2018. His Gleason score 4+5 = 9 which was present in at least 4 cores. 3 other cores had 4+4 = 8 prostate cancer. Imaging studies did not show any clear-cut cut evidence to suggest metastatic disease.  His case was discussed today the prostate cancer multidisciplinary clinic. Imaging studies were reviewed with radiology. His specimen was also discussed by the reviewing pathologist. Given the high risk features associated with his prostate cancer, definitive therapy is warranted and the immediate future. Primary surgical therapy would be a challenge given his comorbid conditions as well as body habitus with possible need for try modality therapy  afterwards. Given his risk of metastatic disease, curative therapy might be more challenging.  Alternatively, definitive radiation therapy with androgen deprivation may be a better option. Androgen deprivation therapy for his high risk disease will be at least for 18 months. Despite his low level of testosterone, I would recommend androgen deprivation therapy as recommended for high risk prostate cancer.  He might require a channel TURP prior to radiation therapy to relieve his urinary symptoms. ADT can be started at any time in preparation for definitive radiation therapy in the near future.

## 2016-05-11 NOTE — Progress Notes (Signed)
Radiation Oncology         [phone]) [phone] ________________________________  Multidisciplinary Prostate Cancer Clinic  Initial Radiation Oncology Consultation  Name: Dale Franklin MRN: [id number]  Date: 05/11/2016  DOB: 09/23/1960  WC:BJSEGB, Crissie Sickles, DO  Raynelle Bring, MD   REFERRING PHYSICIAN: Raynelle Bring, MD  DIAGNOSIS: 56 y.o. gentleman with stage T2c adenocarcinoma of the prostate with a Gleason's score of 4+5=9 and a PSA of 20.4.    ICD-9-CM ICD-10-CM   1. Prostate cancer ([place]) [place] is a 56 y.o. gentleman.  He was noted to have an elevated PSA of 20.4 by his primary care physician, Dr. Nadara Mustard.  Accordingly, he was referred for evaluation in urology by Dr. Diona Fanti on 03/16/16,  digital rectal examination was performed at that time revealing bilateral firmness/nodularity.  Total testosterone was noted to be 34 on 03/16/16 without ADT.  The patient proceeded to transrectal ultrasound with 12 biopsies of the prostate on 04/06/16.  The prostate volume measured 45 cc.  Out of 12 core biopsies,8 were positive.  The maximum Gleason score was 4+5=9, and this was seen in the right mid, right mid lateral, right apex and right apex lateral.  He also had 4+4=8 in the left base lateral, right base and right base lateral as well as 4+3=7 in the left apex.  Bone Scan 04/15/16 was negative for bone metastasis.  CT A&P 04/20/16: demonstrated borderline enlarged right common iliac lymph node measuring 11 mm. On the previous exam 12/2015 this measured 9 mm.  The patient reviewed the biopsy results with his urologist and he has kindly been referred today to the multidisciplinary prostate cancer clinic for presentation of pathology and radiology studies in our conference for discussion of potential radiation treatment options and clinical evaluation.   PREVIOUS RADIATION THERAPY: No  PAST MEDICAL HISTORY:  has a past medical history of COPD (chronic  obstructive pulmonary disease) ([place]); Diabetes mellitus without complication ([place]); Hypertension; and Prostate cancer ([place]).    PAST SURGICAL HISTORY: Past Surgical History:  Procedure Laterality Date  . HAND SURGERY Right   . HERNIA REPAIR    . WRIST SURGERY Right     FAMILY HISTORY: family history includes Cancer in his father and paternal uncle; Diabetes in his mother.  SOCIAL HISTORY:  reports that he has quit smoking. He has never used smokeless tobacco. He reports that he drinks alcohol. He reports that he does not use drugs.  ALLERGIES: Patient has no known allergies.  MEDICATIONS:  Current Outpatient Prescriptions  Medication Sig Dispense Refill  . amoxicillin-clavulanate (AUGMENTIN) 500-125 MG tablet Take 1 tablet by mouth every 12 (twelve) hours.  0  . clotrimazole-betamethasone (LOTRISONE) cream Apply 1 application topically 2 (two) times daily.  0  . DULoxetine (CYMBALTA) 30 MG capsule Take 3 capsules by mouth daily.   5  . lisinopril-hydrochlorothiazide (PRINZIDE,ZESTORETIC) 10-12.5 MG tablet Take 1 tablet by mouth daily.  12  . Melatonin 3 MG TABS Take 1 tablet by mouth at bedtime.  3  . metFORMIN (GLUCOPHAGE-XR) 500 MG 24 hr tablet Take 1 tablet by mouth 2 (two) times daily.  3  . methocarbamol (ROBAXIN) 500 MG tablet Take 500 mg by mouth at bedtime.  3  . metoprolol succinate (TOPROL-XL) 25 MG 24 hr tablet Take 1 tablet by mouth daily.  3  . mirabegron ER (MYRBETRIQ) 25 MG TB24 tablet Take 25 mg by mouth daily.    Mar[place] Kitchen oxybutynin (DITROPAN) 5 MG tablet  TAKE ONE TABLET BY MOUTH EVERY 8 HOURS AS NEEDED FOR FOR BLADDER SPASMS AND BURNING.  11  . predniSONE (DELTASONE) 20 MG tablet Take 2 tablets (40 mg total) by mouth daily. 10 tablet 0  . Saw Palmetto, Serenoa repens, (SAW PALMETTO PO) Take 1,200 mg by mouth daily.  3  . tamsulosin (FLOMAX) 0.4 MG CAPS capsule Take 0.4 mg by mouth at bedtime.  5  . traZODone (DESYREL) 100 MG tablet Take 1 tablet by mouth at bedtime.  3  .  VENTOLIN HFA 108 (90 Base) MCG/ACT inhaler Inhale 1-2 puffs into the lungs every 4 (four) hours as needed.  1   No current facility-administered medications for this encounter.     REVIEW OF SYSTEMS:  On review of systems, the patient reports that he is doing well overall. He denies any fevers, chills, night sweats, unintended weight changes. He endorses fatigue, loss of sleep, and SOB at rest. Pt endorses "prostate pain" described as throbbing. He endorses an irregular heartbeat, palpitations, poor circulation and chest pains. He denies any bowel disturbances, and denies abdominal pain, nausea or vomiting. Pt endorses poor appetite and hx of hernia. He endorses shoulder pain described as throbbing. Pt endorses arthritis. Pt endorses hx of diabetes. Pt endorses depression, anxiety, and phobias. His IPSS was 31, indicating severe urinary symptoms. He endorses dribbling urine, pain with urination, hx of UTIs, and blood in his urine. He had a recent episode of AUR s/p TRUSPBx and is currently having to use clean intermittent catheterization to empty his bladder.  He has completed 2 courses of antibiotics over the past several weeks but has been off abx for the past week.  He is almost never able to complete sexual activity with attempts. A complete review of systems is obtained and is otherwise negative.    PHYSICAL EXAM:  Wt Readings from Last 3 Encounters:  05/11/16 (!) 320 lb 12.8 oz (145.5 kg)  03/26/16 (!) 330 lb (149.7 kg)   Temp Readings from Last 3 Encounters:  05/11/16 97.6 F (36.4 C) (Oral)  04/06/16 97.6 F (36.4 C) (Oral)  03/26/16 97.7 F (36.5 C) (Oral)   BP Readings from Last 3 Encounters:  05/11/16 (!) 83/50  04/06/16 (!) 142/100  03/26/16 (!) 171/114   Pulse Readings from Last 3 Encounters:  05/11/16 (!) 129  04/06/16 100  03/26/16 95   In general this is a well appearing, obese male in no acute distress. He is alert and oriented x4 and appropriate throughout the  examination. HEENT reveals that the patient is normocephalic, atraumatic. EOMs are intact. PERRLA. Skin is intact without any evidence of gross lesions. Cardiovascular exam reveals a tachycardic rhythm that is regular, no clicks rubs or murmurs are auscultated. Chest is clear to auscultation bilaterally. Lymphatic assessment is performed and does not reveal any adenopathy in the cervical, supraclavicular, axillary, or inguinal chains. Abdomen has active bowel sounds in all quadrants and is intact. The abdomen is obese, soft, non tender, non distended. Lower extremities are negative for pretibial pitting edema, deep calf tenderness, cyanosis or clubbing.  KPS = 60  100 - Normal; no complaints; no evidence of disease. 90   - Able to carry on normal activity; minor signs or symptoms of disease. 80   - Normal activity with effort; some signs or symptoms of disease. 34   - Cares for self; unable to carry on normal activity or to do active work. 60   - Requires occasional assistance, but is able to  care for most of his personal needs. 50   - Requires considerable assistance and frequent medical care. 66   - Disabled; requires special care and assistance. 58   - Severely disabled; hospital admission is indicated although death not imminent. 75   - Very sick; hospital admission necessary; active supportive treatment necessary. 10   - Moribund; fatal processes progressing rapidly. 0     - Dead  Karnofsky DA, Abelmann [place], Craver LS and Burchenal [facility] [phone]) The use of the nitrogen mustards in the palliative treatment of carcinoma: with particular reference to bronchogenic carcinoma Cancer 1 [id number]   LABORATORY DATA:  Lab Results  Component Value Date   WBC 5.9 03/26/2016   HGB 15.3 03/26/2016   HCT 46.6 03/26/2016   MCV 90.1 03/26/2016   PLT 107 (L) 03/26/2016   Lab Results  Component Value Date   NA 136 03/26/2016   K 4.3 03/26/2016   CL 97 (L) 03/26/2016   CO2 35 (H) 03/26/2016   No results  found for: ALT, AST, GGT, ALKPHOS, BILITOT   RADIOGRAPHY: Ct Pelvis W Contrast  Result Date: 04/20/2016 CLINICAL DATA:  New diagnosis of prostate cancer. Difficulty with urination. EXAM: CT PELVIS WITH CONTRAST TECHNIQUE: Multidetector CT imaging of the pelvis was performed using the standard protocol following the bolus administration of intravenous contrast. CONTRAST:  176mL ISOVUE-300 IOPAMIDOL (ISOVUE-300) INJECTION 61% COMPARISON:  12/24/2015 FINDINGS: Urinary Tract: Urinary bladder is collapsed around a Foley catheter. Bowel: No pathologic dilatation of the small or large bowel loops. There are 2 ventral abdominal wall hernias which contain nonobstructed loops of small bowel, image 19 of series 2 and image 25 of series 2. Vascular/Lymphatic: Aortic atherosclerosis. There is a right common iliac lymph node which measures 11 mm, image 21 of series 2. Previously 9 mm. There is a right external iliac node which measures 8 mm, image number 35 of series 2. Previously this measured the same. Reproductive:  No mass or other significant abnormality Other:  None. Musculoskeletal: No suspicious bone lesions identified. IMPRESSION: 1. Borderline enlarged right common iliac lymph node measures 11 mm. On the previous exam this measured 9 mm. 2. Ventral abdominal wall hernias are identified. Two of these contain nonobstructed loops of small bowel. 3. Aortic atherosclerosis Electronically Signed   By: Kerby Moors M.D.   On: 04/20/2016 09:59   Nm Bone Scan Whole Body  Result Date: 04/15/2016 CLINICAL DATA:  Recent diagnosis of prostate carcinoma, some right leg pain currently, previous trauma 2 years ago from a fall EXAM: [place]: Whole body anterior and posterior images were obtained approximately 3 hours after intravenous injection of radiopharmaceutical. RADIOPHARMACEUTICALS:  21.0 mCi Technetium-15m MDP IV COMPARISON:  None. FINDINGS: No abnormal uptake of the radionuclide  is seen throughout the skeleton other than probable degenerative change within the right knee. Contamination is noted in the midline of the pelvis. IMPRESSION: No evidence of bone metastasis. Probable degenerative change in the right knee. Electronically Signed   By: Ivar Drape M.D.   On: 04/15/2016 13:43      IMPRESSION/PLAN: 56 y.o. gentleman with a high risk, stage T2c adenocarcinoma of the prostate with a PSA of 20 and a Gleason score of 4+5=9.  We discussed the patient's workup and outlines the nature of prostate cancer in this setting. The patient's T stage, Gleason's score, and PSA put him into the high risk group. Accordingly he is eligible for a variety of potential treatment options including external  radiation with LT ADTor external radiation and brachytherapy boost with LT ADT. We discussed the available radiation techniques, and focused on the details of logistics and delivery.  Given the patient's chronic BOO, he is not an ideal candidate for brachytherapy.  We discussed and outlined the risks, benefits, short and long-term effects associated with radiotherapy.   At the end of the conversation the patient is interested in moving forward with 8 weeks of external radiotherapy. He has not received his first Lupron injection, and has not had placement of gold fiducial markers. We will contact [facility] to make arrangements for start of ADT.  Due to the patient's bladder outlet obstruction, he will require TURP prior to radiotherapy. Once he has fully healed from this procedure, he will be scheduled for fiducial marker placement with [facility]. We will share our discussion with Dr. Dr. Diona Fanti and move forward with scheduling initiation of ADT.     Pt would prefer to have radiation treatments in [place], [place].  [facility] will need arrange for him to meet with Dr. Quitman Livings once he has recovered from his TURP to further discuss radiotherapy and scheduling of CT Simulation.   We spent 60  minutes face to face with the patient and more than 50% of that time was spent in counseling and/or coordination of care.     Nicholos Johns, PA-C  ------------------------------------------------   Tyler Pita, MD [place] Director and Director of Stereotactic Radiosurgery Direct Dial: [phone]  Fax: [phone] [email or web address]  Skype  [email or web address]   This document serves as a record of services personally performed by Tyler Pita, MD and Freeman Caldron, PA-C. It was created on their behalf by Linward Natal, a trained medical scribe. The creation of this record is based on the scribe's personal observations and the provider's statements to them. This document has been checked and approved by the attending provider.

## 2016-05-11 NOTE — Progress Notes (Signed)
                               Care Plan Summary  Name: Dale Franklin DOB: May 31, 1960   Your Medical Team:   Urologist -  Dr. Raynelle Bring, [facility]  Radiation Oncologist - Dr. Tyler Pita, [facility]   Medical Oncologist - Dr. Zola Button, [place]  Recommendations: 1) TURP  2) Androgen Deprivation (hormone injection) 3) Radiation  * These recommendations are based on information available as of today's consult.      Recommendations may change depending on the results of further tests or exams.  Next Steps: 1) Dr. Alan Ripper office will schedule hormone injections and TURP  2) Radiation in [place]. After healed from surgery Dr. Diona Fanti will refer to [facility] for radiation.  When appointments need to be scheduled, you will be contacted by [facility] and/or [facility].  Questions?  Please do not hesitate to call Cira Rue, RN, BSN, OCN at [phone] any questions or concerns.  Shirlean Mylar is your Oncology Nurse Navigator and is available to assist you while you're receiving your medical care at [facility].

## 2016-05-11 NOTE — Consult Note (Signed)
[facility]     05/11/2016   --------------------------------------------------------------------------------   Dale Franklin  MRN: [id number]  PRIMARY CARE:  Dale Lot. Nadara Mustard, MD  DOB: 19-Mar-1960, 56 year old Male  REFERRING:  Dale Lot. Nadara Mustard, MD  SSN: [id number]  PROVIDER:  Franchot Franklin, M.D.    TREATING:  Dale Franklin, M.D.    LOCATION:  [facility] [phone]   --------------------------------------------------------------------------------   CC/HPI: CC: Prostate Cancer   Physician requesting consult: Dr. Franchot Franklin  PCP: Dr. Rory Franklin  Location of consult: [facility] - Prostate Cancer Multidisciplinary [facility]   Dale Franklin is a 56 year old gentleman with multiple comorbid conditions including COPD, atrial fibrillation (managed with warfarin), asthma, depression, diabetes, GERD, hypertension, sleep apnea, and morbid obesity (332 lbs). He was seen in consultation in January 2018 by Dale Franklin for worsening lower urinary tract symptoms despite alpha blocker therapy. He was noted to have bilateral nodularity of the prostate at that time and his PSA was found to be 20.4. Furthermore, his serum testosterone has been noted to be 34 ng/dl at baseline. He underwent a TRUS biopsy of the prostate on 04/06/16 and was found to have Gleason 4+5=9 adenocarcinoma of the prostate with 8 out of 12 biopsy cores positive for malignancy. He did develop urinary retention following his biopsy and has required an indwelling catheter since his biopsy.   Family history: Uncle.   Imaging studies:  Bone scan (04/15/16): Negative for metastases.  CT scan (04/20/16): Two ventral hernias with non-obstructing bowel loops, aortic atherosclerosis, 11 mm right common iliac LN, 8 mm right external iliac LN   PMH: He has a history of hypertension, atrial fibrillation (warfarin), asthma, COPD with recent exacerbations requiring ED visits, depression,  diabetes, GERD, sleep apnea, and morbid obesity.  PSH: He underwent an exploratory surgery many years ago for what he describes as a "bleeding vessel". I'm unsure whether this was an AVM of the bowel or an aneurysm. Regardless, he since has undergone four ventral hernia repairs with mesh.   TNM stage: cT2c N0-1 Mx  PSA: 20.4  Gleason score: 4+5=9  Biopsy (04/06/16): 8/12 cores positive  Left: L lateral apex (10%, 4+3=7), L lateral base (10%, 4+4=8)  Right: R apex (80%, 4+5=9), R lateral apex (70%, 4+5=9), R mid (90%, 4+5=9), R lateral mid (80%, 4+5=9), R base (30%, 4+4=8, PNI), R lateral base (90%, 4+4=8, PNI)  Prostate volume: 45 cc   Nomogram  OC disease: 7%  EPE: 91%  SVI: 55%  LNI: 53%  PFS (5 year, 10 year): 15%,9%   Urinary function: IPSS is 31. He developed urinary retention after his biopsy. He is currently performing CIC.  Erectile function: SHIM score is 5. He has fairly severe erectile dysfunction.     ALLERGIES: None   MEDICATIONS: Augmentin 500 mg-125 mg tablet 1 tablet PO Q 12 H  Metformin Hcl  Metoprolol Succinate  Oxybutynin Chloride 5 mg tablet 1 tablet PO Q 8 H when necessary bladder spasms/burning bladder spasms  Tamsulosin Hcl  Warfarin Sodium  Duloxetine Hcl  Glipizide  Lisinopril-Hydrochlorothiazide  Melatonin  Methocarbamol  Saw Palmetto  Trazodone Hcl  Ventolin Hfa     GU PSH: Catheterize For Residual - 04/27/2016 Prostate Needle Biopsy - 04/06/2016    NON-GU PSH: Hernia Repair Surgical Pathology, Gross And Microscopic Examination For Prostate Needle - 04/06/2016    GU PMH: Prostate Cancer, High-risk prostate cancer with PSA of 20, 8/12 cores from recent biopsy revealing  mostly Gleason 4+5 = 9 pattern adenocarcinoma. One slightly abnormal lymph node and right iliac chain. On scan negative. 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 - 04/27/2016, I am going to get him schedule for a bone scan and CT pelvis for his Gleason  9 prostate cancer with a PSA of 20. , - 04/09/2016 Incomplete bladder emptying, A foley catheter was placed and he was discharged with leg bag drainage. - 04/09/2016 Elevated PSA - 04/06/2016 Nodular prostate w/ LUTS (Improving), He does have a firm prostate. I do not think he has significant obstruction, however. I do not know of prior PSA testing. - 03/16/2016 Overactive bladder, He has significant overactive bladder symptoms. He does seem to empty well. Urinalysis today is clear. - 03/16/2016    NON-GU PMH: Anxiety Arthritis Asthma Atrial Fibrillation Depression Diabetes Type 2 Duodenal ulcer, unspecified as acute or chronic, without hemorrhage or perforation GERD Hypertension Sleep Apnea    FAMILY HISTORY: Cancer - Father Diabetes - Mother Prostate Cancer - Uncle   SOCIAL HISTORY: Marital Status: Divorced Current Smoking Status: Patient does not smoke anymore. Has not smoked since 02/16/2015. Smoked for 30 years. Smoked 1 pack per day.   Tobacco Use Assessment Completed: Used Tobacco in last 30 days? Does drink.  Drinks 2 caffeinated drinks per day. Patient's occupation is/was Disabled.    REVIEW OF SYSTEMS:    GU Review Male:   Patient denies frequent urination, hard to postpone urination, burning/ pain with urination, get up at night to urinate, leakage of urine, stream starts and stops, trouble starting your streams, and have to strain to urinate .  Gastrointestinal (Upper):   Patient denies nausea and vomiting.  Gastrointestinal (Lower):   Patient denies diarrhea and constipation.  Constitutional:   Patient denies fever, night sweats, weight loss, and fatigue.  Skin:   Patient denies skin rash/ lesion and itching.  Eyes:   Patient denies blurred vision and double vision.  Ears/ Nose/ Throat:   Patient denies sore throat and sinus problems.  Hematologic/Lymphatic:   Patient denies swollen glands and easy bruising.  Cardiovascular:   Patient denies leg swelling and chest  pains.  Respiratory:   Patient denies cough and shortness of breath.  Endocrine:   Patient denies excessive thirst.  Musculoskeletal:   Patient denies back pain and joint pain.  Neurological:   Patient denies headaches and dizziness.  Psychologic:   Patient denies depression and anxiety.   VITAL SIGNS: None   MULTI-SYSTEM PHYSICAL EXAMINATION:    Constitutional: Well-nourished. No physical deformities. Normally developed. Good grooming.     PAST DATA REVIEWED:  Source Of History:  Patient  Lab Test Review:   PSA  Records Review:   Pathology Reports, Previous Patient Records   03/16/16  PSA  Total PSA 20.4     04/09/16  Hormones  Testosterone, Total 34 pg/dL    PROCEDURES: None   ASSESSMENT:      ICD-10 Details  1 GU:   Prostate Cancer - C61    PLAN:           Document Letter(s):  Created for Patient: Clinical Summary         Notes:   1. Prostate cancer: I had an in depth discussion with Mr. Grau and his friends today. We discussed his very high risk prostate cancer in the context of his significant medical comorbidities.   The patient was counseled about the natural history of prostate cancer and the standard treatment options  that are available for prostate cancer. It was explained to him how his age and life expectancy, clinical stage, Gleason score, and PSA affect his prognosis, the decision to proceed with additional staging studies, as well as how that information influences recommended treatment strategies. We discussed the roles for active surveillance, radiation therapy, surgical therapy, androgen deprivation, as well as ablative therapy options for the treatment of prostate cancer as appropriate to his individual cancer situation. We discussed the risks and benefits of these options with regard to their impact on cancer control and also in terms of potential adverse events, complications, and impact on quality of life particularly related to urinary and sexual  function. The patient was encouraged to ask questions throughout the discussion today and all questions were answered to his stated satisfaction. In addition, the patient was provided with and/or directed to appropriate resources and literature for further education about prostate cancer and treatment options.   Considering his age and urinary retention, we did discuss the option of surgical therapy. However, he has extremely high risk disease and questionable borderline lymph node involvement and would likely not be cured with surgery and almost definitely would require adjuvant or salvage therapy. Furthermore, he has extensive medical comorbidities that would make surgical therapy quite risky. He also would be unable to undergo a minimally invasive prostatectomy considering his multiple prior abdominal surgeries and ventral hernia mesh repairs. An open prostatectomy would also be considerably difficult due to his morbid obesity. I also do not think a perineal prostatectomy would be beneficial considering his high risk disease and inability to perform a lymphadenectomy.   It was therefore recommended that he proceed with primary radiation therapy in the form of intensity modulated radiation therapy. We also discussed his significantly low baseline testosterone level. Although he technically has castrate levels of serum testosterone, Dr. Alen Blew did recommend that he proceed with androgen deprivation therapy as this may still have a beneficial effect on his cancer. This will also help to ensure that his testosterone level remains castrate during treatment. He would require a minimum of 2 years ideally. It also may be beneficial to check a PSA prior to starting radiation therapy to see what effect androgen deprivation alone has had on his PSA. He will need to be scheduled for fiducial marker placement. I will plan to notify Dale Franklin of the recommendations from the multidisciplinary clinic.   2. Urinary  retention: We also discussed his urinary retention. He is not very pleased performing intermittent catheterization. We therefore discussed the option of possibly proceeding with a channel TURP prior to starting radiation therapy. 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    Cc: Dr. Rory Franklin  Dr. Franchot Franklin  Dr. Tyler Pita  Dr. Zola Button         E & M CODE: I spent at least 60 minutes face to face with the patient, more than 50% of that time was spent on counseling and/or coordinating care.

## 2016-05-11 NOTE — Progress Notes (Signed)
[facility] Psychosocial Distress Screening Spiritual Care  Met with Yochanan in [facility] to introduce [place] team/resources, reviewing distress screen per protocol.  The patient scored a [value unspecified] on the Psychosocial Distress Thermometer which indicates [unspecified] distress. Also assessed for distress and other psychosocial needs.   ONCBCN DISTRESS SCREENING 05/11/2016  Screening Type Initial Screening  Referral to support programs Yes   Mr Ambler was eager to complete clinic, so we met briefly.  Introduced [employer], providing full packet of print information.    Follow up needed: No.  Family/supporters also aware of ongoing Support Team availability, but please page if needs arise.  Thank you.   [place], [place], [facility] Pager [phone] Voicemail [phone]

## 2016-05-18 ENCOUNTER — Telehealth: Payer: Self-pay | Admitting: Medical Oncology

## 2016-05-18 NOTE — Progress Notes (Signed)
[facility] to discuss Mr. Dehner recent admission to [facility] for UTI and his request to scheduled TURP as soon as possible.  I was informed that Dr. Diona Fanti is on vacation this week and will return Monday. Dr. Diona Fanti wtill be informed of the above and they asked patient keep appointment 05/25/16 to discuss.

## 2016-05-18 NOTE — Progress Notes (Signed)
Kim-friend called to inform us that Mr. Sumler was hospitalized at [facility] 3/28-3/31 with a severe UTI. He was seen in the Prostate [facility] 3/27.18. He has  issues with urinary retention and having a TURP before radiation treatments was discussed during the clinic. He would like go proceed with surgery before he gets another infection. He has been doing self catheterizations. He has an appointment with Dr. Diona Fanti in [place]  05/25/16. She asked if we could get the TURP scheduled instead of seeing Dr. Diona Fanti next week. I explained that he should be receiving his androgen deprivation during this visit. I will call [facility] to inform Dr. Diona Fanti of recent hospitalization and request for TUPR as soon as possible. I will call her back with an update. She voiced understanding.

## 2016-05-20 ENCOUNTER — Telehealth: Payer: Self-pay | Admitting: Medical Oncology

## 2016-05-20 NOTE — Progress Notes (Signed)
I spoke with Dale Franklin to inform her I spoke with Dale Franklin office regarding patient being admitted at [facility] with UTI. I explained that Dale Franklin is out of the office this week and will be back on Monday. I did leave a detailed message for Dale Franklin so he is aware that patient would like to move forward with TURP. . I asked if Dale Franklin was given antibiotics on discharge and she states Cipro for a week. He has an appointment early next week with Dale Franklin which he will get his hormone injection and he can discuss the TURP. I asked her to call me back with questions or concerns. She thanked me for my efforts.

## 2016-05-25 ENCOUNTER — Ambulatory Visit (INDEPENDENT_AMBULATORY_CARE_PROVIDER_SITE_OTHER): Payer: BLUE CROSS/BLUE SHIELD | Admitting: Urology

## 2016-05-25 ENCOUNTER — Other Ambulatory Visit: Payer: Self-pay | Admitting: Urology

## 2016-05-25 DIAGNOSIS — N403 Nodular prostate with lower urinary tract symptoms: Secondary | ICD-10-CM | POA: Diagnosis not present

## 2016-05-25 DIAGNOSIS — R3914 Feeling of incomplete bladder emptying: Secondary | ICD-10-CM

## 2016-05-25 DIAGNOSIS — C61 Malignant neoplasm of prostate: Secondary | ICD-10-CM

## 2016-05-26 ENCOUNTER — Encounter: Payer: Self-pay | Admitting: *Deleted

## 2016-05-28 ENCOUNTER — Ambulatory Visit: Payer: BLUE CROSS/BLUE SHIELD | Admitting: Radiation Oncology

## 2016-05-28 ENCOUNTER — Encounter (HOSPITAL_COMMUNITY)
Admission: RE | Admit: 2016-05-28 | Discharge: 2016-05-28 | Disposition: A | Discharge status: Home / Self Care | Payer: BLUE CROSS/BLUE SHIELD | Source: Ambulatory Visit | Attending: Urology | Admitting: Urology

## 2016-05-28 ENCOUNTER — Encounter (HOSPITAL_COMMUNITY): Payer: Self-pay

## 2016-05-28 ENCOUNTER — Encounter (INDEPENDENT_AMBULATORY_CARE_PROVIDER_SITE_OTHER): Payer: Self-pay

## 2016-05-28 DIAGNOSIS — I4892 Unspecified atrial flutter: Secondary | ICD-10-CM | POA: Insufficient documentation

## 2016-05-28 DIAGNOSIS — I10 Essential (primary) hypertension: Secondary | ICD-10-CM | POA: Diagnosis not present

## 2016-05-28 DIAGNOSIS — C61 Malignant neoplasm of prostate: Secondary | ICD-10-CM | POA: Diagnosis not present

## 2016-05-28 DIAGNOSIS — Z01818 Encounter for other preprocedural examination: Secondary | ICD-10-CM | POA: Diagnosis not present

## 2016-05-28 DIAGNOSIS — I454 Nonspecific intraventricular block: Secondary | ICD-10-CM | POA: Diagnosis not present

## 2016-05-28 HISTORY — DX: Cardiac arrhythmia, unspecified: I49.9

## 2016-05-28 HISTORY — DX: Depression, unspecified: F32.A

## 2016-05-28 HISTORY — DX: Major depressive disorder, single episode, unspecified: F32.9

## 2016-05-28 HISTORY — DX: Unspecified osteoarthritis, unspecified site: M19.90

## 2016-05-28 LAB — CBC
HCT: 41.9 % (ref 39.0–52.0)
HEMOGLOBIN: 13.2 g/dL (ref 13.0–17.0)
MCH: 28 pg (ref 26.0–34.0)
MCHC: 31.5 g/dL (ref 30.0–36.0)
MCV: 89 fL (ref 78.0–100.0)
Platelets: 243 10*3/uL (ref 150–400)
RBC: 4.71 MIL/uL (ref 4.22–5.81)
RDW: 15.4 % (ref 11.5–15.5)
WBC: 8.7 10*3/uL (ref 4.0–10.5)

## 2016-05-28 LAB — BASIC METABOLIC PANEL
ANION GAP: 4 — AB (ref 5–15)
BUN: 21 mg/dL — ABNORMAL HIGH (ref 6–20)
CALCIUM: 9.7 mg/dL (ref 8.9–10.3)
CO2: 33 mmol/L — ABNORMAL HIGH (ref 22–32)
Chloride: 101 mmol/L (ref 101–111)
Creatinine, Ser: 1.11 mg/dL (ref 0.61–1.24)
GFR calc Af Amer: >60 mL/min (ref >60)
Glucose, Bld: 78 mg/dL (ref 65–99)
Potassium: 5.1 mmol/L (ref 3.5–5.1)
SODIUM: 138 mmol/L (ref 135–145)

## 2016-05-28 LAB — PROTIME-INR
INR: 1.54
PROTHROMBIN TIME: 18.7 s — AB (ref 11.4–15.2)

## 2016-05-28 LAB — GLUCOSE, CAPILLARY: GLUCOSE-CAPILLARY: 167 mg/dL — AB (ref 65–99)

## 2016-05-28 NOTE — Patient Instructions (Addendum)
Dale Franklin  05/28/2016   Your procedure is scheduled on: 05/31/16  Report to [facility] Main  Entrance Take [place]  elevators to 3rd floor to  [place] at 7:00 AM.    Call this number if you have problems the morning of surgery [phone]   Remember: ONLY 1 PERSON MAY GO WITH YOU TO SHORT STAY TO GET  READY MORNING OF [name].    Follow Dr. Alan Ripper instructions leading up to surgery.    Do not eat food or drink liquids :After Midnight.    Take these medicines the morning of surgery with A SIP OF WATER: duloxetine, metoprolol, mirabegron, ventolin inhaler if needed, Levofloxacin (instructions from Dr. Diona Fanti to take morning of your procedure), Nitrofurantoin.   How to Manage Your Diabetes Before and After Surgery  Why is it important to control my blood sugar before and after surgery? . Improving blood sugar levels before and after surgery helps healing and can limit problems. . A way of improving blood sugar control is eating a healthy diet by: o  Eating less sugar and carbohydrates o  Increasing activity/exercise o  Talking with your doctor about reaching your blood sugar goals . High blood sugars (greater than 180 mg/dL) can raise your risk of infections and slow your recovery, so you will need to focus on controlling your diabetes during the weeks before surgery. . Make sure that the doctor who takes care of your diabetes knows about your planned surgery including the date and location.  How do I manage my blood sugar before surgery? . Check your blood sugar at least 4 times a day, starting 2 days before surgery, to make sure that the level is not too high or low. o Check your blood sugar the morning of your surgery when you wake up and every 2 hours until you get to the Short Stay unit. . If your blood sugar is less than 70 mg/dL, you will need to treat for low blood sugar: o Do not take insulin. o Treat a low blood  sugar (less than 70 mg/dL) with  cup of clear juice (cranberry or apple), 4 glucose tablets, OR glucose gel. o Recheck blood sugar in 15 minutes after treatment (to make sure it is greater than 70 mg/dL). If your blood sugar is not greater than 70 mg/dL on recheck, call [phone] for further instructions. . Report your blood sugar to the short stay nurse when you get to Short Stay.  . If you are admitted to the hospital after surgery: o Your blood sugar will be checked by the staff and you will probably be given insulin after surgery (instead of oral diabetes medicines) to make sure you have good blood sugar levels. o The goal for blood sugar control after surgery is 80-180 mg/dL.   WHAT DO I DO ABOUT MY DIABETES MEDICATION?  Marland Kitchen Day before surgery: o Take Metformin as usual o Only take morning dose of Glipizide   . Morning of surgery o Do not take oral diabetes medicines (pills) the morning of surgery.   Patient Signature:  Date:   Nurse Signature:  Date:   Reviewed and Endorsed by [facility] Patient Education Committee, August 2015                               You may  not have any metal on your body including hair pins and              piercings  Do not wear jewelry, make-up, lotions, powders or perfumes, deodorant             Do not wear nail polish.  Do not shave  48 hours prior to surgery.              Men may shave face and neck.   Do not bring valuables to the hospital. [place].  Contacts, dentures or bridgework may not be worn into surgery.  Leave suitcase in the car. After surgery it may be brought to your room.               Please read over the following fact sheets you were given: _____________________________________________________________________             [facility] - Preparing for Surgery Before surgery, you can play an important role.  Because skin is not sterile, your skin needs to be as free of  germs as possible.  You can reduce the number of germs on your skin by washing with CHG (chlorahexidine gluconate) soap before surgery.  CHG is an antiseptic cleaner which kills germs and bonds with the skin to continue killing germs even after washing. Please DO NOT use if you have an allergy to CHG or antibacterial soaps.  If your skin becomes reddened/irritated stop using the CHG and inform your nurse when you arrive at Short Stay. Do not shave (including legs and underarms) for at least 48 hours prior to the first CHG shower.  You may shave your face/neck. Please follow these instructions carefully:  1.  Shower with CHG Soap the night before surgery and the  morning of Surgery.  2.  If you choose to wash your hair, wash your hair first as usual with your  normal  shampoo.  3.  After you shampoo, rinse your hair and body thoroughly to remove the  shampoo.                           4.  Use CHG as you would any other liquid soap.  You can apply chg directly  to the skin and wash                       Gently with a scrungie or clean washcloth.  5.  Apply the CHG Soap to your body ONLY FROM THE NECK DOWN.   Do not use on face/ open                           Wound or open sores. Avoid contact with eyes, ears mouth and genitals (private parts).                       Wash face,  Genitals (private parts) with your normal soap.             6.  Wash thoroughly, paying special attention to the area where your surgery  will be performed.  7.  Thoroughly rinse your body with warm water from the neck down.  8.  DO NOT shower/wash with your normal soap after using and rinsing off  the  CHG Soap.                9.  Pat yourself dry with a clean towel.            10.  Wear clean pajamas.            11.  Place clean sheets on your bed the night of your first shower and do not  sleep with pets. Day of Surgery : Do not apply any lotions/deodorants the morning of surgery.  Please wear clean clothes to the  hospital/surgery center.  FAILURE TO FOLLOW THESE INSTRUCTIONS MAY RESULT IN THE CANCELLATION OF YOUR SURGERY PATIENT SIGNATURE_________________________________  NURSE SIGNATURE__________________________________  ________________________________________________________________________

## 2016-05-28 NOTE — Pre-Procedure Instructions (Signed)
03-29-16 EKG (epic)

## 2016-05-28 NOTE — Progress Notes (Signed)
   05/28/16 1317  OBSTRUCTIVE SLEEP APNEA  Have you ever been diagnosed with sleep apnea through a sleep study? No  Do you snore loudly (loud enough to be heard through closed doors)?  0  Do you often feel tired, fatigued, or sleepy during the daytime (such as falling asleep during driving or talking to someone)? 0  Has anyone observed you stop breathing during your sleep? 0  Do you have, or are you being treated for high blood pressure? 1  BMI more than 35 kg/m2? 1  Age > 50 (1-yes) 1  Neck circumference greater than:Male 16 inches or larger, Male 17inches or larger? 1  Male Gender (Yes=1) 1  Obstructive Sleep Apnea Score 5

## 2016-05-28 NOTE — Pre-Procedure Instructions (Addendum)
Pt states last day he took coumadin was Wednesday May 26, 2016.  Holding for surgery.  Spoke with Dr. Nyoka Cowden regarding pt's EKG.  She instructed to evaluate pt upon admission.

## 2016-05-29 LAB — HEMOGLOBIN A1C
HEMOGLOBIN A1C: 6.5 % — AB (ref 4.8–5.6)
Mean Plasma Glucose: 140 mg/dL

## 2016-05-30 MED ORDER — DEXTROSE 5 % IV SOLN
3.0000 g | INTRAVENOUS | Status: AC
  Administered 2016-05-31: 3 g via INTRAVENOUS
  Filled 2016-05-30: qty 3
  Filled 2016-05-30: qty 3000

## 2016-05-31 ENCOUNTER — Encounter (HOSPITAL_COMMUNITY): Payer: Self-pay | Admitting: *Deleted

## 2016-05-31 ENCOUNTER — Ambulatory Visit (HOSPITAL_COMMUNITY): Payer: BLUE CROSS/BLUE SHIELD | Admitting: Anesthesiology

## 2016-05-31 ENCOUNTER — Ambulatory Visit (HOSPITAL_COMMUNITY)
Admission: RE | Admit: 2016-05-31 | Discharge: 2016-05-31 | Disposition: A | Discharge status: Home / Self Care | Payer: BLUE CROSS/BLUE SHIELD | Source: Ambulatory Visit | Attending: Urology | Admitting: Urology

## 2016-05-31 ENCOUNTER — Encounter (HOSPITAL_COMMUNITY)
Admission: RE | Disposition: A | Discharge status: Home / Self Care | Payer: Self-pay | Source: Ambulatory Visit | Attending: Urology

## 2016-05-31 ENCOUNTER — Observation Stay (HOSPITAL_COMMUNITY)
Admission: RE | Admit: 2016-05-31 | Discharge: 2016-06-01 | Disposition: A | Discharge status: Home / Self Care | Payer: BLUE CROSS/BLUE SHIELD | Source: Ambulatory Visit | Attending: Urology | Admitting: Urology

## 2016-05-31 DIAGNOSIS — J449 Chronic obstructive pulmonary disease, unspecified: Secondary | ICD-10-CM | POA: Diagnosis not present

## 2016-05-31 DIAGNOSIS — I10 Essential (primary) hypertension: Secondary | ICD-10-CM | POA: Diagnosis not present

## 2016-05-31 DIAGNOSIS — E119 Type 2 diabetes mellitus without complications: Secondary | ICD-10-CM | POA: Insufficient documentation

## 2016-05-31 DIAGNOSIS — C61 Malignant neoplasm of prostate: Secondary | ICD-10-CM

## 2016-05-31 DIAGNOSIS — Z7984 Long term (current) use of oral hypoglycemic drugs: Secondary | ICD-10-CM | POA: Insufficient documentation

## 2016-05-31 DIAGNOSIS — Z8546 Personal history of malignant neoplasm of prostate: Secondary | ICD-10-CM

## 2016-05-31 DIAGNOSIS — Z79899 Other long term (current) drug therapy: Secondary | ICD-10-CM | POA: Diagnosis not present

## 2016-05-31 DIAGNOSIS — N320 Bladder-neck obstruction: Secondary | ICD-10-CM

## 2016-05-31 DIAGNOSIS — F17210 Nicotine dependence, cigarettes, uncomplicated: Secondary | ICD-10-CM | POA: Insufficient documentation

## 2016-05-31 DIAGNOSIS — F329 Major depressive disorder, single episode, unspecified: Secondary | ICD-10-CM | POA: Insufficient documentation

## 2016-05-31 HISTORY — PX: GOLD SEED IMPLANT: SHX6343

## 2016-05-31 HISTORY — PX: TRANSURETHRAL RESECTION OF PROSTATE: SHX73

## 2016-05-31 LAB — APTT: aPTT: 35 seconds (ref 24–36)

## 2016-05-31 LAB — GLUCOSE, CAPILLARY
Glucose-Capillary: 128 mg/dL — ABNORMAL HIGH (ref 65–99)
Glucose-Capillary: 89 mg/dL (ref 65–99)
Glucose-Capillary: 190 mg/dL — ABNORMAL HIGH (ref 65–99)

## 2016-05-31 LAB — PROTIME-INR
INR: 1.77
PROTHROMBIN TIME: 20.9 s — AB (ref 11.4–15.2)

## 2016-05-31 SURGERY — TURP (TRANSURETHRAL RESECTION OF PROSTATE)
Anesthesia: General

## 2016-05-31 MED ORDER — HYDROMORPHONE HCL 2 MG/ML IJ SOLN
INTRAMUSCULAR | Status: AC
  Filled 2016-05-31: qty 1

## 2016-05-31 MED ORDER — URIBEL 118 MG PO CAPS: 1.0000 | ORAL_CAPSULE | Freq: Three times a day (TID) | ORAL | 0 refills | Status: DC | PRN

## 2016-05-31 MED ORDER — BELLADONNA ALKALOIDS-OPIUM 16.2-60 MG RE SUPP
1.0000 | Freq: Four times a day (QID) | RECTAL | Status: DC | PRN
  Administered 2016-05-31: 1 via RECTAL
  Filled 2016-05-31 (×2): qty 1

## 2016-05-31 MED ORDER — PROMETHAZINE HCL 25 MG/ML IJ SOLN: 6.2500 mg | INTRAMUSCULAR | Status: DC | PRN

## 2016-05-31 MED ORDER — OXYBUTYNIN CHLORIDE 5 MG PO TABS
5.0000 mg | ORAL_TABLET | Freq: Three times a day (TID) | ORAL | Status: DC | PRN
  Administered 2016-05-31: 5 mg via ORAL
  Filled 2016-05-31: qty 1

## 2016-05-31 MED ORDER — MIDAZOLAM HCL 2 MG/2ML IJ SOLN
INTRAMUSCULAR | Status: AC
  Filled 2016-05-31: qty 2

## 2016-05-31 MED ORDER — PROPOFOL 10 MG/ML IV BOLUS
INTRAVENOUS | Status: DC | PRN
  Administered 2016-05-31: 200 mg via INTRAVENOUS

## 2016-05-31 MED ORDER — MEPERIDINE HCL 50 MG/ML IJ SOLN: 6.2500 mg | INTRAMUSCULAR | Status: DC | PRN

## 2016-05-31 MED ORDER — ACETAMINOPHEN 325 MG PO TABS
ORAL_TABLET | ORAL | Status: AC
  Filled 2016-05-31: qty 2

## 2016-05-31 MED ORDER — OXYBUTYNIN CHLORIDE 5 MG PO TABS: 5.0000 mg | ORAL_TABLET | Freq: Once | ORAL | Status: DC

## 2016-05-31 MED ORDER — METHOCARBAMOL 500 MG PO TABS
250.0000 mg | ORAL_TABLET | Freq: Once | ORAL | Status: AC
  Administered 2016-05-31: 250 mg via ORAL

## 2016-05-31 MED ORDER — HYDROMORPHONE HCL 1 MG/ML IJ SOLN
0.2500 mg | INTRAMUSCULAR | Status: DC | PRN
  Administered 2016-05-31 (×2): 0.25 mg via INTRAVENOUS
  Administered 2016-05-31: 0.5 mg via INTRAVENOUS
  Administered 2016-05-31 (×2): 0.25 mg via INTRAVENOUS
  Administered 2016-05-31: 0.5 mg via INTRAVENOUS
  Filled 2016-05-31 (×7): qty 0.5

## 2016-05-31 MED ORDER — METHOCARBAMOL 500 MG PO TABS
ORAL_TABLET | ORAL | Status: AC
  Filled 2016-05-31: qty 1

## 2016-05-31 MED ORDER — LACTATED RINGERS IV SOLN
INTRAVENOUS | Status: DC
  Administered 2016-05-31: 21:00:00 via INTRAVENOUS

## 2016-05-31 MED ORDER — SODIUM CHLORIDE 0.9 % IR SOLN
Status: DC | PRN
  Administered 2016-05-31: 6000 mL

## 2016-05-31 MED ORDER — SENNA 8.6 MG PO TABS
1.0000 | ORAL_TABLET | Freq: Two times a day (BID) | ORAL | Status: DC
  Administered 2016-05-31: 8.6 mg via ORAL
  Filled 2016-05-31: qty 1

## 2016-05-31 MED ORDER — DEXMEDETOMIDINE HCL IN NACL 200 MCG/50ML IV SOLN
INTRAVENOUS | Status: DC | PRN
  Administered 2016-05-31 (×2): 12 ug via INTRAVENOUS
  Administered 2016-05-31: 8 ug via INTRAVENOUS

## 2016-05-31 MED ORDER — HYDROCODONE-ACETAMINOPHEN 5-325 MG PO TABS
1.0000 | ORAL_TABLET | ORAL | Status: DC | PRN
  Administered 2016-05-31 (×2): 2 via ORAL
  Filled 2016-05-31 (×2): qty 2

## 2016-05-31 MED ORDER — LIDOCAINE 2% (20 MG/ML) 5 ML SYRINGE
INTRAMUSCULAR | Status: DC | PRN
  Administered 2016-05-31: 60 mg via INTRAVENOUS

## 2016-05-31 MED ORDER — FENTANYL CITRATE (PF) 100 MCG/2ML IJ SOLN
INTRAMUSCULAR | Status: AC
  Filled 2016-05-31: qty 2

## 2016-05-31 MED ORDER — PROPOFOL 10 MG/ML IV BOLUS
INTRAVENOUS | Status: AC
  Filled 2016-05-31: qty 20

## 2016-05-31 MED ORDER — INSULIN ASPART 100 UNIT/ML ~~LOC~~ SOLN: 0.0000 [IU] | Freq: Every day | SUBCUTANEOUS | Status: DC

## 2016-05-31 MED ORDER — ACETAMINOPHEN 325 MG PO TABS
650.0000 mg | ORAL_TABLET | ORAL | Status: DC | PRN
  Administered 2016-05-31: 650 mg via ORAL

## 2016-05-31 MED ORDER — DULOXETINE HCL 60 MG PO CPEP: 90.0000 mg | ORAL_CAPSULE | Freq: Every day | ORAL | Status: DC

## 2016-05-31 MED ORDER — MIDAZOLAM HCL 5 MG/5ML IJ SOLN
INTRAMUSCULAR | Status: DC | PRN
  Administered 2016-05-31: 2 mg via INTRAVENOUS

## 2016-05-31 MED ORDER — METOPROLOL TARTRATE 5 MG/5ML IV SOLN
INTRAVENOUS | Status: DC | PRN
  Administered 2016-05-31: 1 mg via INTRAVENOUS
  Administered 2016-05-31: .5 mg via INTRAVENOUS
  Administered 2016-05-31: 1 mg via INTRAVENOUS
  Administered 2016-05-31: .5 mg via INTRAVENOUS
  Administered 2016-05-31: 1 mg via INTRAVENOUS

## 2016-05-31 MED ORDER — ALBUTEROL SULFATE (2.5 MG/3ML) 0.083% IN NEBU: 3.0000 mL | INHALATION_SOLUTION | Freq: Four times a day (QID) | RESPIRATORY_TRACT | Status: DC | PRN

## 2016-05-31 MED ORDER — ONDANSETRON HCL 4 MG/2ML IJ SOLN
INTRAMUSCULAR | Status: DC | PRN
  Administered 2016-05-31: 4 mg via INTRAVENOUS

## 2016-05-31 MED ORDER — TRAZODONE HCL 50 MG PO TABS
50.0000 mg | ORAL_TABLET | Freq: Every day | ORAL | Status: DC
  Administered 2016-05-31: 50 mg via ORAL
  Filled 2016-05-31: qty 1

## 2016-05-31 MED ORDER — HYDROCHLOROTHIAZIDE 10 MG/ML ORAL SUSPENSION
6.2500 mg | Freq: Every day | ORAL | Status: DC
  Filled 2016-05-31 (×2): qty 1.25

## 2016-05-31 MED ORDER — HYDROMORPHONE HCL 2 MG/ML IJ SOLN
1.0000 mg | Freq: Once | INTRAMUSCULAR | Status: AC
  Administered 2016-05-31: 1 mg via INTRAVENOUS

## 2016-05-31 MED ORDER — LACTATED RINGERS IV SOLN: INTRAVENOUS | Status: DC

## 2016-05-31 MED ORDER — LISINOPRIL 5 MG PO TABS: 5.0000 mg | ORAL_TABLET | Freq: Every day | ORAL | Status: DC

## 2016-05-31 MED ORDER — METHOCARBAMOL 500 MG PO TABS
500.0000 mg | ORAL_TABLET | Freq: Every day | ORAL | Status: DC
  Administered 2016-05-31: 500 mg via ORAL
  Filled 2016-05-31: qty 1

## 2016-05-31 MED ORDER — PHENYLEPHRINE 40 MCG/ML (10ML) SYRINGE FOR IV PUSH (FOR BLOOD PRESSURE SUPPORT)
PREFILLED_SYRINGE | INTRAVENOUS | Status: DC | PRN
  Administered 2016-05-31: 80 ug via INTRAVENOUS

## 2016-05-31 MED ORDER — INSULIN ASPART 100 UNIT/ML ~~LOC~~ SOLN: 0.0000 [IU] | Freq: Three times a day (TID) | SUBCUTANEOUS | Status: DC

## 2016-05-31 MED ORDER — LISINOPRIL-HYDROCHLOROTHIAZIDE 10-12.5 MG PO TABS: 0.5000 | ORAL_TABLET | Freq: Every day | ORAL | Status: DC

## 2016-05-31 MED ORDER — FENTANYL CITRATE (PF) 100 MCG/2ML IJ SOLN
INTRAMUSCULAR | Status: DC | PRN
  Administered 2016-05-31 (×3): 50 ug via INTRAVENOUS
  Administered 2016-05-31: 100 ug via INTRAVENOUS

## 2016-05-31 MED ORDER — DEXMEDETOMIDINE HCL IN NACL 200 MCG/50ML IV SOLN
INTRAVENOUS | Status: AC
  Filled 2016-05-31: qty 50

## 2016-05-31 MED ORDER — HYDROCODONE-ACETAMINOPHEN 5-325 MG PO TABS: 1.0000 | ORAL_TABLET | ORAL | 0 refills | Status: DC | PRN

## 2016-05-31 MED ORDER — LACTATED RINGERS IV SOLN
INTRAVENOUS | Status: DC
  Administered 2016-05-31 (×2): via INTRAVENOUS

## 2016-05-31 MED ORDER — METOPROLOL SUCCINATE ER 25 MG PO TB24
25.0000 mg | ORAL_TABLET | Freq: Two times a day (BID) | ORAL | Status: DC
  Administered 2016-05-31: 25 mg via ORAL
  Filled 2016-05-31: qty 1

## 2016-05-31 MED ORDER — SULFAMETHOXAZOLE-TRIMETHOPRIM 800-160 MG PO TABS
1.0000 | ORAL_TABLET | Freq: Two times a day (BID) | ORAL | Status: DC
  Administered 2016-05-31 – 2016-06-01 (×3): 1 via ORAL
  Filled 2016-05-31 (×3): qty 1

## 2016-05-31 MED ORDER — ONDANSETRON HCL 4 MG/2ML IJ SOLN: 4.0000 mg | INTRAMUSCULAR | Status: DC | PRN

## 2016-05-31 SURGICAL SUPPLY — 22 items
BAG URINE DRAINAGE (UROLOGICAL SUPPLIES) IMPLANT
BAG URO CATCHER STRL LF (MISCELLANEOUS) ×2 IMPLANT
BLADE SURG 15 STRL LF DISP TIS (BLADE) IMPLANT
BLADE SURG 15 STRL SS (BLADE)
CATH FOLEY 3WAY 30CC 22FR (CATHETERS) IMPLANT
CATH HEMA 3WAY 30CC 22FR COUDE (CATHETERS) ×2 IMPLANT
COVER SURGICAL LIGHT HANDLE (MISCELLANEOUS) ×2 IMPLANT
ELECT REM PT RETURN 15FT ADLT (MISCELLANEOUS) ×2 IMPLANT
EVACUATOR MICROVAS BLADDER (UROLOGICAL SUPPLIES) ×2 IMPLANT
GLOVE BIOGEL M 8.0 STRL (GLOVE) ×2 IMPLANT
GOWN STRL REUS W/ TWL XL LVL3 (GOWN DISPOSABLE) ×1 IMPLANT
GOWN STRL REUS W/TWL XL LVL3 (GOWN DISPOSABLE) ×3 IMPLANT
HOLDER FOLEY CATH W/STRAP (MISCELLANEOUS) IMPLANT
LOOP CUT BIPOLAR 24F LRG (ELECTROSURGICAL) ×2 IMPLANT
MANIFOLD NEPTUNE II (INSTRUMENTS) ×2 IMPLANT
MARKER GOLD PRELOAD 1.2X3 (Urological Implant) ×3 IMPLANT
PACK CYSTO (CUSTOM PROCEDURE TRAY) ×2 IMPLANT
SEED GOLD PRELOAD 1.2X3 (Urological Implant) ×6 IMPLANT
SET ASPIRATION TUBING (TUBING) ×2 IMPLANT
SUT ETHILON 3 0 PS 1 (SUTURE) IMPLANT
SYR 30ML LL (SYRINGE) IMPLANT
TUBING CONNECTING 10 (TUBING) ×2 IMPLANT

## 2016-05-31 NOTE — Discharge Instructions (Signed)
Transurethral Resection of the Prostate  Care After  Refer to this sheet in the next few weeks. These discharge instructions provide you with general information on caring for yourself after you leave the hospital. Your caregiver may also give you specific instructions. Your treatment has been planned according to the most current medical practices available, but unavoidable complications sometimes occur. If you have any problems or questions after discharge, please call your caregiver.  HOME CARE INSTRUCTIONS   Medications  You may receive medicine for pain management. As your level of discomfort decreases, adjustments in your pain medicines may be made.   Take all medicines as directed.   You may be given a medicine (antibiotic) to kill germs following surgery. Finish all medicines. Let your caregiver know if you have any side effects or problems from the medicine.   If you are on aspirin or coumadin, it would be best not to restart the aspirin until the blood in the urine clears Hygiene  You can take a shower after surgery.   You should not take a bath while you still have the urethral catheter. Activity  You will be encouraged to get out of bed as much as possible and increase your activity level as tolerated.   Spend the first week in and around your home. For 3 weeks, avoid the following:   Straining.   Running.   Strenuous work.   Walks longer than a few blocks.   Riding for extended periods.   Sexual relations.   Do not lift heavy objects (more than 20 pounds) for at least 1 month. When lifting, use your arms instead of your abdominal muscles.   You will be encouraged to walk as tolerated. Do not exert yourself. Increase your activity level slowly. Remember that it is important to keep moving after an operation of any type. This cuts down on the possibility of developing blood clots.   Your caregiver will tell you when you can resume driving and light housework.  Discuss this at your first office visit after discharge. Diet  No special diet is ordered after a TURP. However, if you are on a special diet for another medical problem, it should be continued.   Normal fluid intake is usually recommended.   Avoid alcohol and caffeinated drinks for 2 weeks. They irritate the bladder. Decaffeinated drinks are okay.   Avoid spicy foods.  Bladder Function  For the first 10 days, empty the bladder whenever you feel a definite desire. Do not try to hold the urine for long periods of time.   Urinating once or twice a night even after you are healed is not uncommon.   You may see some recurrence of blood in the urine after discharge from the hospital. This usually happens within 2 weeks after the procedure.If this occurs, force fluids again as you did in the hospital and reduce your activity.  Bowel Function  You may experience some constipation after surgery. This can be minimized by increasing fluids and fiber in your diet. Drink enough water and fluids to keep your urine clear or pale yellow.   A stool softener may be prescribed for use at home. Do not strain to move your bowels.   If you are requiring increased pain medicine, it is important that you take stool softeners to prevent constipation. This will help to promote proper healing by reducing the need to strain to move your bowels.  Sexual Activity  Semen movement in the opposite direction and into  the bladder (retrograde ejaculation) may occur. Since the semen passes into the bladder, cloudy urine can occur the first time you urinate after intercourse. Or, you may not have an ejaculation during erection. Ask your caregiver when you can resume sexual activity. Retrograde ejaculation and reduced semen discharge should not reduce one's pleasure of intercourse.  Postoperative Visit  Arrange the date and time of your after surgery visit with your caregiver.  Return to Work  After your recovery is  complete, you will be able to return to work and resume all activities. Your caregiver will inform you when you can return to work.  Foley Catheter Care A soft, flexible tube (Foley catheter) may have been placed in your bladder to drain urine and fluid. Follow these instructions: Taking Care of the Catheter  Keep the area where the catheter leaves your body clean.   Attach the catheter to the leg so there is no tension on the catheter.   Keep the drainage bag below the level of the bladder, but keep it OFF the floor.   Do not take long soaking baths. Your caregiver will give instructions about showering.   Wash your hands before touching ANYTHING related to the catheter or bag.   Using mild soap and warm water on a washcloth:   Clean the area closest to the catheter insertion site using a circular motion around the catheter.   Clean the catheter itself by wiping AWAY from the insertion site for several inches down the tube.   NEVER wipe upward as this could sweep bacteria up into the urethra (tube in your body that normally drains the bladder) and cause infection.   Place a small amount of sterile lubricant at the tip of the penis where the catheter is entering.  Taking Care of the Drainage Bags  Two drainage bags may be taken home: a large overnight drainage bag, and a smaller leg bag which fits underneath clothing.   It is okay to wear the overnight bag at any time, but NEVER wear the smaller leg bag at night.   Keep the drainage bag well below the level of your bladder. This prevents backflow of urine into the bladder and allows the urine to drain freely.   Anchor the tubing to your leg to prevent pulling or tension on the catheter. Use tape or a leg strap provided by the hospital.   Empty the drainage bag when it is 1/2 to 3/4 full. Wash your hands before and after touching the bag.   Periodically check the tubing for kinks to make sure there is no pressure on the tubing  which could restrict the flow of urine.  Changing the Drainage Bags  Cleanse both ends of the clean bag with alcohol before changing.   Pinch off the rubber catheter to avoid urine spillage during the disconnection.   Disconnect the dirty bag and connect the clean one.   Empty the dirty bag carefully to avoid a urine spill.   Attach the new bag to the leg with tape or a leg strap.  Cleaning the Drainage Bags  Whenever a drainage bag is disconnected, it must be cleaned quickly so it is ready for the next use.   Wash the bag in warm, soapy water.   Rinse the bag thoroughly with warm water.   Soak the bag for 30 minutes in a solution of white vinegar and water (1 cup vinegar to 1 quart warm water).   Rinse with warm water.  SEEK MEDICAL CARE IF:   You have chills or night sweats.   You are leaking around your catheter or have problems with your catheter. It is not uncommon to have sporadic leakage around your catheter as a result of bladder spasms. If the leakage stops, there is not much need for concern. If you are uncertain, call your caregiver.   You develop side effects that you think are coming from your medicines.  SEEK IMMEDIATE MEDICAL CARE IF:   You are suddenly unable to urinate. Check to see if there are any kinks in the drainage tubing that may cause this. If you cannot find any kinks, call your caregiver immediately. This is an emergency.   You develop shortness of breath or chest pains.   Bleeding persists or clots develop in your urine.   You have a fever.   You develop pain in your back or over your lower belly (abdomen).   You develop pain or swelling in your legs.   Any problems you are having get worse rather than better.

## 2016-05-31 NOTE — Progress Notes (Signed)
Post-op note  Subjective: The patient is doing well.  Main c/o--bladder spasms  Objective: Vital signs in last 24 hours: Temp:  [97.6 F (36.4 C)-99.3 F (37.4 C)] 97.6 F (36.4 C) (04/16 1405) Pulse Rate:  [81-109] 90 (04/16 1405) Resp:  [12-23] 16 (04/16 1405) BP: (118-156)/(82-126) 127/100 (04/16 1405) SpO2:  [87 %-100 %] 97 % (04/16 1405) Weight:  [144.7 kg (319 lb)] 144.7 kg (319 lb) (04/16 0732)  Intake/Output from previous day: No intake/output data recorded. Intake/Output this shift: No intake/output data recorded.  Physical Exam:  General: Alert and oriented.   Irrigant clear pink w/o clots  Lab Results: No results for input(s): HGB, HCT in the last 72 hours.  Assessment/Plan: POD#0   1) Continue to monitor 2) D/c cath, home in am   Lillette Boxer. Florance Paolillo, MD   LOS: 0 days   Jorja Loa 05/31/2016, 7:29 PM

## 2016-05-31 NOTE — H&P (Signed)
Urology History and Physical Exam  CC: Prostate cancer  HPI: 56 year old male presents for placement of fiducial markers in his prostate in advance of EBRT for prostate cancer, as well as for a TURP for management of BOO from his prostate. HE was recently diagnosed with high volume, high grade PCA, and was recently started on ADT with Firmagon in advance of EBRT. HE also has BOO from his prostate and had needed I/O catheterization for proper bladder drainage.  PMH: Past Medical History:  Diagnosis Date  . Arthritis   . COPD (chronic obstructive pulmonary disease) ([place])   . Depression   . Diabetes mellitus without complication ([place])   . Dysrhythmia    A flutter  . Hypertension   . Prostate cancer ([place])     PSH: Past Surgical History:  Procedure Laterality Date  . HAND SURGERY Right   . HERNIA REPAIR    . WRIST SURGERY Right     Allergies: No Known Allergies  Medications: No prescriptions prior to admission.     Social History: Social History   Social History  . Marital status: Divorced    Spouse name: N/A  . Number of children: N/A  . Years of education: N/A   Occupational History  . Not on file.   Social History Main Topics  . Smoking status: Current Every Day Smoker    Packs/day: 0.50    Types: Cigarettes  . Smokeless tobacco: Never Used  . Alcohol use Yes     Comment: occasionally  . Drug use: No  . Sexual activity: Not on file   Other Topics Concern  . Not on file   Social History Narrative  . No narrative on file    Family History: Family History  Problem Relation Age of Onset  . Diabetes Mother   . Cancer Father   . Cancer Paternal Uncle     prostate    Review of Systems: Positive: Urinary retention Negative: A further 10 point review of systems was negative except what is listed in the HPI.                  Physical Exam: @VITALS2 @ General: No acute distress.  Awake. Head:  Normocephalic.  Atraumatic. ENT:  EOMI.  Mucous  membranes moist Neck:  Supple.  No lymphadenopathy. CV:  S1 present. S2 present. Regular rate. Pulmonary: Equal effort bilaterally.  Clear to auscultation bilaterally. Abdomen: Soft.  Non tender to palpation. Skin:  Normal turgor.  No visible rash. Extremity: No gross deformity of bilateral upper extremities.  No gross deformity of                             lower extremities. Neurologic: Alert. Appropriate mood.    Studies:  Recent Labs     05/28/16  1407  HGB  13.2  WBC  8.7  PLT  243    Recent Labs     05/28/16  1407  NA  138  K  5.1  CL  101  CO2  33*  BUN  21*  CREATININE  1.11  CALCIUM  9.7  GFRNONAA  >60  GFRAA  >60     Recent Labs     05/28/16  1407  INR  1.54     Invalid input(s): ABG    Assessment:  1. PCA, high volume/high grade, initiated ADT, awaiting EBRT 2. BOO  Plan: TURP, placement of fiducial markers

## 2016-05-31 NOTE — Anesthesia Preprocedure Evaluation (Addendum)
Anesthesia Evaluation  Patient identified by MRN, date of birth, ID band Patient awake    Reviewed: Allergy & Precautions, NPO status , Patient's Chart, lab work & pertinent test results  Airway Mallampati: II  TM Distance: >3 FB Neck ROM: Full    Dental  (+) Teeth Intact, Dental Advisory Given   Pulmonary COPD, Current Smoker,    breath sounds clear to auscultation       Cardiovascular hypertension, Pt. on home beta blockers and Pt. on medications + dysrhythmias  Rhythm:Regular Rate:Normal     Neuro/Psych PSYCHIATRIC DISORDERS Depression    GI/Hepatic negative GI ROS, Neg liver ROS,   Endo/Other  diabetes, Type 2, Oral Hypoglycemic Agents  Renal/GU negative Renal ROS  negative genitourinary   Musculoskeletal  (+) Arthritis , Osteoarthritis,    Abdominal   Peds negative pediatric ROS (+)  Hematology negative hematology ROS (+)   Anesthesia Other Findings   Reproductive/Obstetrics negative OB ROS                            Lab Results  Component Value Date   WBC 8.7 05/28/2016   HGB 13.2 05/28/2016   HCT 41.9 05/28/2016   MCV 89.0 05/28/2016   PLT 243 05/28/2016   Lab Results  Component Value Date   CREATININE 1.11 05/28/2016   BUN 21 (H) 05/28/2016   NA 138 05/28/2016   K 5.1 05/28/2016   CL 101 05/28/2016   CO2 33 (H) 05/28/2016   Lab Results  Component Value Date   INR 1.77 05/31/2016   INR 1.54 05/28/2016   INR 2.12 03/26/2016    EKG: atrial flutter.   Anesthesia Physical Anesthesia Plan  ASA: III  Anesthesia Plan: General   Post-op Pain Management:    Induction: Intravenous  Airway Management Planned: LMA  Additional Equipment:   Intra-op Plan:   Post-operative Plan: Extubation in OR  Informed Consent: I have reviewed the patients History and Physical, chart, labs and discussed the procedure including the risks, benefits and alternatives for the  proposed anesthesia with the patient or authorized representative who has indicated his/her understanding and acceptance.   Dental advisory given  Plan Discussed with: CRNA  Anesthesia Plan Comments:       Anesthesia Quick Evaluation

## 2016-05-31 NOTE — Anesthesia Procedure Notes (Signed)
Procedure Name: LMA Insertion Performed by: Cailee Blanke J Pre-anesthesia Checklist: Patient identified, Emergency Drugs available, Suction available, Patient being monitored and Timeout performed Patient Re-evaluated:Patient Re-evaluated prior to inductionOxygen Delivery Method: Circle system utilized Preoxygenation: Pre-oxygenation with 100% oxygen Intubation Type: IV induction Ventilation: Mask ventilation without difficulty LMA: LMA inserted LMA Size: 5.0 Number of attempts: 1 Placement Confirmation: positive ETCO2,  CO2 detector and breath sounds checked- equal and bilateral Tube secured with: Tape Dental Injury: Teeth and Oropharynx as per pre-operative assessment        

## 2016-05-31 NOTE — Anesthesia Postprocedure Evaluation (Signed)
Anesthesia Post Note  Patient: Dale Franklin  Procedure(s) Performed: Procedure(s) (LRB): TRANSURETHRAL RESECTION OF THE PROSTATE (TURP) (N/A) GOLD SEED IMPLANT (N/A)  Patient location during evaluation: PACU Anesthesia Type: General Level of consciousness: awake and alert Pain management: pain level controlled Vital Signs Assessment: post-procedure vital signs reviewed and stable Respiratory status: spontaneous breathing, nonlabored ventilation, respiratory function stable and patient connected to nasal cannula oxygen Cardiovascular status: blood pressure returned to baseline and stable Postop Assessment: no signs of nausea or vomiting Anesthetic complications: no       Last Vitals:  Vitals:   05/31/16 1345 05/31/16 1405  BP: 128/86 (!) 127/100  Pulse: 89 90  Resp: 14 16  Temp: 36.8 C 36.4 C    Last Pain:  Vitals:   05/31/16 1636  TempSrc:   PainSc: Asleep                 Effie Berkshire

## 2016-05-31 NOTE — Progress Notes (Signed)
Called Dr. Diona Fanti regarding fleet enema order. He stated it did not need to be given this morning, but encourage patient to have bowel movement prior to surgery. Instructed patient, [name] verbalized understanding.

## 2016-05-31 NOTE — Transfer of Care (Signed)
Immediate Anesthesia Transfer of Care Note  Patient: Dale Franklin  Procedure(s) Performed: Procedure(s): TRANSURETHRAL RESECTION OF THE PROSTATE (TURP) (N/A) GOLD SEED IMPLANT (N/A)  Patient Location: PACU  Anesthesia Type:General  Level of Consciousness: awake, alert  and oriented  Airway & Oxygen Therapy: Patient Spontanous Breathing and Patient connected to face mask oxygen  Post-op Assessment: Report given to RN and Post -op Vital signs reviewed and stable  Post vital signs: Reviewed and stable  Last Vitals:  Vitals:   05/31/16 0713  BP: (!) 133/96  Pulse: 100  Resp: 18  Temp: 37.4 C    Last Pain:  Vitals:   05/31/16 0713  TempSrc: Oral      Patients Stated Pain Goal: 3 (52/77/82 4235)  Complications: No apparent anesthesia complications

## 2016-05-31 NOTE — Op Note (Signed)
Preoperative diagnosis: Adenocarcinoma prostate, high risk, with bladder outlet obstruction  Postoperative diagnosis: Same  Principal procedure: Transrectal ultrasound, placement of fiducial markers 3, transurethral incision of prostate  Surgeon: Rami Waddle  Anesthesia: Gen.  Specimen: Prostate chips, to pathology  Drains: 22 French hematuria catheter, three-way on CBI  Estimated blood loss: Less than 50 mL  Indications: 56 year old male recently diagnosed with high-risk prostate cancer.  Additionally, the patient has bladder outlet obstruction.  He has been started on androgen deprivation therapy.  He has been scheduled for eventual external beam radiotherapy.  He presents at this time for placement of fiducial markers in advance of his expected external beam radiotherapy, as well as transurethral incision/resection of his prostate for management of his bladder outlet obstruction.  The procedure of both of these have been discussed recently him a specifically last week, with the patient.  He understands the risks and complications, which include but are not limited to infection, bleeding, need for catheter drainage, anesthetic complications, among others.  He understands these and desires to proceed.  Description of procedure: The patient was properly identified in the holding area.  He received preoperative IV antibiotics.  He was taken the operating room where general anesthesia was administered.  He was then placed in the left lateral decubitus position.  Proper timeout was then performed.  The transrectal ultrasound probe was then advanced in the patient's rectum.  The prostate was scanned.,  3 separate gold fiducial markers were then placed.  The first of these was at the base of the prostate, just to the right of the midline.  The second was at the right apex, just to the right of the midline, the third was in the extreme left lobe of the prostate, in the mid prostate.  All 3 of these were  placed approximately 1 centimeter into the prostate from the rectal surface.  This was done without difficulty, without significant bleeding.  At this point, the probe was removed.  The patient was then moved to the operating table and placement dorsolithotomy position.  Genitalia and perineum were prepped and draped.   I then passed a visual obturator into the patient's bladder.  It was very difficult, as the patient was quite obese and had a high riding bladder neck.  I had to use a 0 lens for this.  Once in the bladder and over the bladder neck, I switched to the 30 lens.  The resectoscope was then placed with the cutting loop.  Prostate was nonobstructive at the lateral lobes, but as I previously dictated, the bladder neck was very high riding.  I felt that a transurethral incision of the prostate would be an adequate treatment for his obstruction.  I then resected, from the bladder neck to the verumontanum, the entire posterior prostate.  A very small amount of prostate chips were produced.  Following resection, I then irrigated the chips from the bladder and sent these for permanent specimen.  Careful cauterization was then performed in the resected area until adequate hemostasis was achieved.  With the irrigation stopped, no significant bleeding was seen.  At this point, the scope was removed.  A 22 French three-way hematuria catheter was then placed.  Balloon was filled with 30 mL of water and placed on gentle traction.  It was then hooked to CBI.  The patient was then awakened.  He was taken to the PACU in stable condition.  He tolerated procedure well.

## 2016-06-01 ENCOUNTER — Encounter (HOSPITAL_COMMUNITY): Payer: Self-pay | Admitting: Urology

## 2016-06-01 ENCOUNTER — Telehealth: Payer: Self-pay | Admitting: Medical Oncology

## 2016-06-01 DIAGNOSIS — N320 Bladder-neck obstruction: Secondary | ICD-10-CM

## 2016-06-01 DIAGNOSIS — C61 Malignant neoplasm of prostate: Secondary | ICD-10-CM | POA: Diagnosis not present

## 2016-06-01 NOTE — Progress Notes (Signed)
Mr. Wenke had TURP yesterday. He states he is doing well post op with no complaints. He also had his gold markers placed for radiation. Once he is healed from the TURP he will be scheduled for treatment. He had his first [place] injection 05/26/16. I asked him to call me with any questions or concerns. He voiced understanding.

## 2016-06-01 NOTE — Progress Notes (Signed)
Patient had D/C orders before coming on shift. No change from previous RN assessment. Patient stable and awaiting ride. Will continue to monitor.

## 2016-06-01 NOTE — Discharge Summary (Signed)
Physician Discharge Summary  Patient ID: Dale Franklin MRN: [id number] DOB/AGE: May 29, 1960 56 y.o.  Admit date: 05/31/2016 Discharge date: 06/01/2016  Admission Diagnoses:  Malignant neoplasm of prostate [facility])  Discharge Diagnoses:  Principal Problem:   Malignant neoplasm of prostate [facility]) Active Problems:   Bladder outlet obstruction   Past Medical History:  Diagnosis Date  . Arthritis   . COPD (chronic obstructive pulmonary disease) ([place])   . Depression   . Diabetes mellitus without complication ([place])   . Dysrhythmia    A flutter  . Hypertension   . Prostate cancer [facility])     Surgeries: Procedure(s): TRANSURETHRAL RESECTION OF THE PROSTATE (TURP) GOLD SEED IMPLANT on 05/31/2016   Consultants (if any):   Discharged Condition: Improved  Hospital Course: Dale Franklin is an 56 y.o. male who was admitted 05/31/2016 with a diagnosis of Malignant neoplasm of prostate ([place]) and went to the operating room on 05/31/2016 and underwent the above named procedures.    He has done well and voided post foley removal this morning.  He will be sent home.   He was given perioperative antibiotics:  Anti-infectives    Start     Dose/Rate Route Frequency Ordered Stop   05/31/16 1500  sulfamethoxazole-trimethoprim (BACTRIM DS,SEPTRA DS) 800-160 MG per tablet 1 tablet     1 tablet Oral Every 12 hours 05/31/16 1400     05/31/16 0600  ceFAZolin (ANCEF) 3 g in dextrose 5 % 50 mL IVPB     3 g 130 mL/hr over 30 Minutes Intravenous 30 min pre-op 05/30/16 1029 05/31/16 0920    .  He was given sequential compression devices for DVT prophylaxis.  He benefited maximally from the hospital stay and there were no complications.    Recent vital signs:  Vitals:   06/01/16 0203 06/01/16 0601  BP: 130/71 133/82  Pulse: 84 88  Resp: 18 20  Temp: 98 F (36.7 C) 98.4 F (36.9 C)    Recent laboratory studies:  Lab Results  Component Value Date   HGB 13.2 05/28/2016   HGB 15.3 03/26/2016    Lab Results  Component Value Date   WBC 8.7 05/28/2016   PLT 243 05/28/2016   Lab Results  Component Value Date   INR 1.77 05/31/2016   Lab Results  Component Value Date   NA 138 05/28/2016   K 5.1 05/28/2016   CL 101 05/28/2016   CO2 33 (H) 05/28/2016   BUN 21 (H) 05/28/2016   CREATININE 1.11 05/28/2016   GLUCOSE 78 05/28/2016    Discharge Medications:   Allergies as of 06/01/2016   No Known Allergies     Medication List    STOP taking these medications   nitrofurantoin 100 MG capsule Commonly known as:  MACRODANTIN   warfarin 10 MG tablet Commonly known as:  COUMADIN   warfarin 2.5 MG tablet Commonly known as:  COUMADIN     TAKE these medications   albuterol 108 (90 Base) MCG/ACT inhaler Commonly known as:  PROVENTIL HFA;VENTOLIN HFA Inhale 1-2 puffs into the lungs every 6 (six) hours as needed for wheezing or shortness of breath.   DULoxetine 30 MG capsule Commonly known as:  CYMBALTA Take 90 mg by mouth daily.   glipiZIDE 10 MG tablet Commonly known as:  GLUCOTROL Take 10 mg by mouth 2 (two) times daily.   HYDROcodone-acetaminophen 5-325 MG tablet Commonly known as:  NORCO/VICODIN Take 1-2 tablets by mouth every 4 (four) hours as needed for moderate pain.   levofloxacin  750 MG tablet Commonly known as:  LEVAQUIN Take 750 mg by mouth once.   lisinopril-hydrochlorothiazide 10-12.5 MG tablet Commonly known as:  PRINZIDE,ZESTORETIC Take 0.5 tablets by mouth daily.   Melatonin 3 MG Tabs Take 3 mg by mouth at bedtime.   metFORMIN 500 MG 24 hr tablet Commonly known as:  GLUCOPHAGE-XR Take 500 mg by mouth 2 (two) times daily.   methocarbamol 500 MG tablet Commonly known as:  ROBAXIN Take 500 mg by mouth at bedtime.   metoprolol succinate 25 MG 24 hr tablet Commonly known as:  TOPROL-XL Take 25 mg by mouth 2 (two) times daily.   oxybutynin 5 MG tablet Commonly known as:  DITROPAN Take 5 mg by mouth every 8 (eight) hours as needed for  bladder spasms.   traZODone 100 MG tablet Commonly known as:  DESYREL Take 50 mg by mouth at bedtime.   URIBEL 118 MG Caps Take 1 capsule (118 mg total) by mouth every 8 (eight) hours as needed.       Diagnostic Studies: [place] Intraoperative  Result Date: 05/31/2016 CLINICAL DATA:  Ultrasound was provided for use by the ordering physician, and a technical charge was applied by the performing facility.  No radiologist interpretation/professional services rendered.    Disposition: 01-Home or Self Care  Discharge Instructions    Discontinue IV    Complete by:  As directed       Follow-up Information    DAHLSTEDT, Lillette Boxer, MD Follow up.   Specialty:  Urology Why:  If not already scheduled, please call the office for an appointment for 2-3 weeks from discharge.  Contact information: [employer]  [phone]            Signed: Malka So 06/01/2016, 6:53 AM

## 2016-06-22 ENCOUNTER — Ambulatory Visit: Payer: BLUE CROSS/BLUE SHIELD | Admitting: Urology

## 2016-06-29 ENCOUNTER — Ambulatory Visit (INDEPENDENT_AMBULATORY_CARE_PROVIDER_SITE_OTHER): Payer: Self-pay | Admitting: Urology

## 2016-06-29 DIAGNOSIS — C61 Malignant neoplasm of prostate: Secondary | ICD-10-CM

## 2016-07-02 ENCOUNTER — Ambulatory Visit (INDEPENDENT_AMBULATORY_CARE_PROVIDER_SITE_OTHER): Payer: BLUE CROSS/BLUE SHIELD | Admitting: Urology

## 2016-07-02 DIAGNOSIS — C61 Malignant neoplasm of prostate: Secondary | ICD-10-CM

## 2016-07-16 NOTE — Anesthesia Postprocedure Evaluation (Signed)
Anesthesia Post Note  Patient: Dale Franklin  Procedure(s) Performed: Procedure(s) (LRB): TRANSURETHRAL RESECTION OF THE PROSTATE (TURP) (N/A) GOLD SEED IMPLANT (N/A)     Anesthesia Post Evaluation  Last Vitals:  Vitals:   06/01/16 0203 06/01/16 0601  BP: 130/71 133/82  Pulse: 84 88  Resp: 18 20  Temp: 36.7 C 36.9 C    Last Pain:  Vitals:   06/01/16 0746  TempSrc:   PainSc: 0-No pain                 Effie Berkshire

## 2016-07-16 NOTE — Addendum Note (Signed)
Addendum  created 07/16/16 1335 by Effie Berkshire, MD   Sign clinical note

## 2016-07-20 ENCOUNTER — Other Ambulatory Visit: Payer: Self-pay | Admitting: Urology

## 2016-07-20 DIAGNOSIS — C61 Malignant neoplasm of prostate: Secondary | ICD-10-CM

## 2016-07-21 ENCOUNTER — Other Ambulatory Visit: Payer: Self-pay | Admitting: Urology

## 2016-07-21 DIAGNOSIS — C61 Malignant neoplasm of prostate: Secondary | ICD-10-CM

## 2016-07-27 ENCOUNTER — Encounter (HOSPITAL_COMMUNITY): Payer: Self-pay

## 2016-07-27 ENCOUNTER — Ambulatory Visit (HOSPITAL_COMMUNITY)
Admission: RE | Admit: 2016-07-27 | Discharge: 2016-07-27 | Disposition: A | Discharge status: Home / Self Care | Payer: BLUE CROSS/BLUE SHIELD | Source: Ambulatory Visit | Attending: Urology | Admitting: Urology

## 2016-07-27 DIAGNOSIS — C61 Malignant neoplasm of prostate: Secondary | ICD-10-CM | POA: Diagnosis present

## 2016-07-27 MED ORDER — GENTAMICIN SULFATE 40 MG/ML IJ SOLN
160.0000 mg | Freq: Once | INTRAMUSCULAR | Status: AC
  Administered 2016-07-27: 160 mg via INTRAMUSCULAR

## 2016-07-27 MED ORDER — LIDOCAINE HCL (PF) 2 % IJ SOLN
10.0000 mL | Freq: Once | INTRAMUSCULAR | Status: AC
  Administered 2016-07-27: 10 mL

## 2016-07-27 MED ORDER — LIDOCAINE HCL (PF) 2 % IJ SOLN
INTRAMUSCULAR | Status: AC
  Administered 2016-07-27: 10 mL
  Filled 2016-07-27: qty 10

## 2016-07-27 MED ORDER — GENTAMICIN SULFATE 40 MG/ML IJ SOLN
INTRAMUSCULAR | Status: AC
  Administered 2016-07-27: 160 mg via INTRAMUSCULAR
  Filled 2016-07-27: qty 4

## 2016-11-02 ENCOUNTER — Ambulatory Visit (INDEPENDENT_AMBULATORY_CARE_PROVIDER_SITE_OTHER): Payer: BLUE CROSS/BLUE SHIELD | Admitting: Urology

## 2016-11-02 DIAGNOSIS — C61 Malignant neoplasm of prostate: Secondary | ICD-10-CM | POA: Diagnosis not present

## 2017-03-08 ENCOUNTER — Ambulatory Visit: Payer: BLUE CROSS/BLUE SHIELD | Admitting: Urology

## 2017-03-08 DIAGNOSIS — C61 Malignant neoplasm of prostate: Secondary | ICD-10-CM | POA: Diagnosis not present

## 2017-03-08 DIAGNOSIS — Z79899 Other long term (current) drug therapy: Secondary | ICD-10-CM

## 2017-04-14 ENCOUNTER — Ambulatory Visit: Payer: BLUE CROSS/BLUE SHIELD | Attending: Neurology | Admitting: Neurology

## 2017-04-14 DIAGNOSIS — I4891 Unspecified atrial fibrillation: Secondary | ICD-10-CM | POA: Insufficient documentation

## 2017-04-14 DIAGNOSIS — G4733 Obstructive sleep apnea (adult) (pediatric): Secondary | ICD-10-CM | POA: Diagnosis present

## 2017-04-14 DIAGNOSIS — Z7901 Long term (current) use of anticoagulants: Secondary | ICD-10-CM | POA: Insufficient documentation

## 2017-04-14 DIAGNOSIS — Z79899 Other long term (current) drug therapy: Secondary | ICD-10-CM | POA: Insufficient documentation

## 2017-04-14 DIAGNOSIS — Z7984 Long term (current) use of oral hypoglycemic drugs: Secondary | ICD-10-CM | POA: Diagnosis not present

## 2017-04-14 DIAGNOSIS — R0683 Snoring: Secondary | ICD-10-CM | POA: Diagnosis not present

## 2017-04-19 NOTE — Procedures (Signed)
Kist[place] A. Merlene Laughter, MD     [email or web address]             NOCTURNAL POLYSOMNOGRAPHY   LOCATION: [facility]   Patient Name: Dale Franklin, Dale Franklin Date: 04/14/2017 Gender: Male D.O.B: 1960-10-22 Age (years): 53 Referring Provider: Phillips Odor MD, ABSM Height (inches): 74 Interpreting Physician: Phillips Odor MD, ABSM Weight (lbs): 312 RPSGT: Peak, Yishai BMI: 40 MRN: [id number] Neck Size: 19.00 CLINICAL INFORMATION Sleep Study Type: NPSG     Indication for sleep study: N/A     Epworth Sleepiness Score: 3     SLEEP STUDY TECHNIQUE As per the AASM Manual for the Scoring of Sleep and Associated Events v2.3 (April 2016) with a hypopnea requiring 4% desaturations.  The channels recorded and monitored were frontal, central and occipital EEG, electrooculogram (EOG), submentalis EMG (chin), nasal and oral airflow, thoracic and abdominal wall motion, anterior tibialis EMG, snore microphone, electrocardiogram, and pulse oximetry.  MEDICATIONS Medications self-administered by patient taken the night of the study : N/A  Current Outpatient Medications:  .  albuterol (PROVENTIL HFA;VENTOLIN HFA) 108 (90 Base) MCG/ACT inhaler, Inhale 1-2 puffs into the lungs every 6 (six) hours as needed for wheezing or shortness of breath., Disp: , Rfl:  .  DULoxetine (CYMBALTA) 30 MG capsule, Take 90 mg by mouth daily. , Disp: , Rfl: 5 .  glipiZIDE (GLUCOTROL) 10 MG tablet, Take 10 mg by mouth 2 (two) times daily., Disp: , Rfl:  .  HYDROcodone-acetaminophen (NORCO/VICODIN) 5-325 MG tablet, Take 1-2 tablets by mouth every 4 (four) hours as needed for moderate pain., Disp: 10 tablet, Rfl: 0 .  levofloxacin (LEVAQUIN) 750 MG tablet, Take 750 mg by mouth once., Disp: , Rfl:  .  lisinopril-hydrochlorothiazide (PRINZIDE,ZESTORETIC) 10-12.5 MG tablet, Take 0.5 tablets by mouth daily. , Disp: , Rfl: 12 .  Melatonin 3 MG TABS, Take 3 mg by mouth at bedtime. , Disp: , Rfl: 3 .   metFORMIN (GLUCOPHAGE-XR) 500 MG 24 hr tablet, Take 500 mg by mouth 2 (two) times daily. , Disp: , Rfl: 3 .  Meth-Hyo-M Bl-Na Phos-Ph Sal (URIBEL) 118 MG CAPS, Take 1 capsule (118 mg total) by mouth every 8 (eight) hours as needed., Disp: 20 capsule, Rfl: 0 .  methocarbamol (ROBAXIN) 500 MG tablet, Take 500 mg by mouth at bedtime., Disp: , Rfl: 3 .  metoprolol succinate (TOPROL-XL) 25 MG 24 hr tablet, Take 25 mg by mouth 2 (two) times daily. , Disp: , Rfl: 3 .  oxybutynin (DITROPAN) 5 MG tablet, Take 5 mg by mouth every 8 (eight) hours as needed for bladder spasms., Disp: , Rfl:  .  traZODone (DESYREL) 100 MG tablet, Take 50 mg by mouth at bedtime. , Disp: , Rfl: 3 .  warfarin (COUMADIN) 10 MG tablet, TAKE ONE TABLET BY MOUTH IN THE EVENING (BEDTIME), Disp: , Rfl:  .  warfarin (COUMADIN) 2.5 MG tablet, TAKE ONE TABLET BY MOUTH IN THE EVENING (BEDTIME), Disp: , Rfl:      SLEEP ARCHITECTURE The study was initiated at 10:41:39 PM and ended at 4:57:24 AM.  Sleep onset time was 53.0 minutes and the sleep efficiency was 71.3%%. The total sleep time was 267.8 minutes.  Stage REM latency was 213.5 minutes.  The patient spent 6.7%% of the night in stage N1 sleep, 80.6%% in stage N2 sleep, 1.9%% in stage N3 and 10.83% in REM.  Alpha intrusion was absent.  Supine sleep was 0.00%.  RESPIRATORY PARAMETERS The overall apnea/hypopnea index (AHI) was 2.0 per  hour. There were 0 total apneas, including 0 obstructive, 0 central and 0 mixed apneas. There were 9 hypopneas and 32 RERAs.  The AHI during Stage REM sleep was 8.3 per hour.  AHI while supine was N/A per hour.  The mean oxygen saturation was 91.6%. The minimum SpO2 during sleep was 83.0%.  snoring was noted during this study.  CARDIAC DATA The 2 lead EKG demonstrated atrial fibrillation. The mean heart rate was 23.9 beats per minute. Other EKG findings include: None. LEG MOVEMENT DATA The total PLMS were 0 with a resulting PLMS index of  0.0. Associated arousal with leg movement index was 3.6.  IMPRESSIONS Reduced slow-wave sleep is observed.  Otherwise, the study is unrevealing.  Delano Metz, MD Diplomate, American Board of Sleep Medicine.  ELECTRONICALLY SIGNED ON:  04/19/2017, 11:37 AM [facility] PH: [phone]   FX: [phone] [place]

## 2017-05-21 ENCOUNTER — Encounter (HOSPITAL_COMMUNITY): Payer: Self-pay

## 2017-05-21 ENCOUNTER — Emergency Department (HOSPITAL_COMMUNITY)
Admission: EM | Admit: 2017-05-21 | Discharge: 2017-05-21 | Discharge status: Against Medical Advice | Payer: BLUE CROSS/BLUE SHIELD | Attending: Emergency Medicine | Admitting: Emergency Medicine

## 2017-05-21 DIAGNOSIS — Z7984 Long term (current) use of oral hypoglycemic drugs: Secondary | ICD-10-CM | POA: Insufficient documentation

## 2017-05-21 DIAGNOSIS — F17210 Nicotine dependence, cigarettes, uncomplicated: Secondary | ICD-10-CM | POA: Insufficient documentation

## 2017-05-21 DIAGNOSIS — E119 Type 2 diabetes mellitus without complications: Secondary | ICD-10-CM | POA: Insufficient documentation

## 2017-05-21 DIAGNOSIS — R32 Unspecified urinary incontinence: Secondary | ICD-10-CM | POA: Diagnosis present

## 2017-05-21 DIAGNOSIS — Z7901 Long term (current) use of anticoagulants: Secondary | ICD-10-CM | POA: Insufficient documentation

## 2017-05-21 DIAGNOSIS — C61 Malignant neoplasm of prostate: Secondary | ICD-10-CM | POA: Diagnosis not present

## 2017-05-21 DIAGNOSIS — J449 Chronic obstructive pulmonary disease, unspecified: Secondary | ICD-10-CM | POA: Insufficient documentation

## 2017-05-21 DIAGNOSIS — I10 Essential (primary) hypertension: Secondary | ICD-10-CM | POA: Diagnosis not present

## 2017-05-21 DIAGNOSIS — Z5329 Procedure and treatment not carried out because of patient's decision for other reasons: Secondary | ICD-10-CM

## 2017-05-21 DIAGNOSIS — Z79899 Other long term (current) drug therapy: Secondary | ICD-10-CM | POA: Diagnosis not present

## 2017-05-21 DIAGNOSIS — Z5320 Procedure and treatment not carried out because of patient's decision for unspecified reasons: Secondary | ICD-10-CM

## 2017-05-21 LAB — I-STAT CHEM 8, ED
BUN: 22 mg/dL — ABNORMAL HIGH (ref 6–20)
CALCIUM ION: 1.17 mmol/L (ref 1.15–1.40)
Chloride: 96 mmol/L — ABNORMAL LOW (ref 101–111)
Creatinine, Ser: 1.6 mg/dL — ABNORMAL HIGH (ref 0.61–1.24)
GLUCOSE: 134 mg/dL — AB (ref 65–99)
HCT: 40 % (ref 39.0–52.0)
HEMOGLOBIN: 13.6 g/dL (ref 13.0–17.0)
POTASSIUM: 4.3 mmol/L (ref 3.5–5.1)
Sodium: 138 mmol/L (ref 135–145)
TCO2: 30 mmol/L (ref 22–32)

## 2017-05-21 NOTE — ED Provider Notes (Signed)
[facility] EMERGENCY DEPARTMENT Provider Note   CSN: [id number] Arrival date & time: 05/21/17  0216     History   Chief Complaint Chief Complaint  Patient presents with  . Urinary Incontinence    HPI Dale Franklin is a 57 y.o. male.  Progressively worsening urinary incontinence. Had had TURP and radiation in past for prostate ca. Wants a PSA done. Has an appt Monday with urologist but wanted to be seen sooner. Recently started 15 mg Morphine BID for back pain as well. Chronic abdominal pain is unchanged.    Urinary Frequency  This is a new problem. The current episode started more than 1 week ago. The problem occurs hourly. The problem has been gradually worsening. Pertinent negatives include no chest pain, no headaches and no shortness of breath. Nothing aggravates the symptoms. Nothing relieves the symptoms. He has tried nothing for the symptoms. The treatment provided no relief.    Past Medical History:  Diagnosis Date  . Arthritis   . COPD (chronic obstructive pulmonary disease) ([place])   . Depression   . Diabetes mellitus without complication ([place])   . Dysrhythmia    A flutter  . Hypertension   . Prostate cancer ([place])   . Prostate cancer [facility])     Patient Active Problem List   Diagnosis Date Noted  . Bladder outlet obstruction 06/01/2016  . Malignant neoplasm of prostate ([place]) 05/31/2016  . Prostate cancer ([place]) 05/11/2016    Past Surgical History:  Procedure Laterality Date  . GOLD SEED IMPLANT N/A 05/31/2016   Procedure: GOLD SEED IMPLANT;  Surgeon: Franchot Gallo, MD;  Location: [facility];  Service: Urology;  Laterality: N/A;  . HAND SURGERY Right   . HERNIA REPAIR    . TRANSURETHRAL RESECTION OF PROSTATE N/A 05/31/2016   Procedure: TRANSURETHRAL RESECTION OF THE PROSTATE (TURP);  Surgeon: Franchot Gallo, MD;  Location: [facility];  Service: Urology;  Laterality: N/A;  . WRIST SURGERY Right         Home Medications    Prior to Admission medications     Medication Sig Start Date End Date Taking? Authorizing Provider  albuterol (PROVENTIL HFA;VENTOLIN HFA) 108 (90 Base) MCG/ACT inhaler Inhale 1-2 puffs into the lungs every 6 (six) hours as needed for wheezing or shortness of breath.    [provider]  DULoxetine (CYMBALTA) 30 MG capsule Take 90 mg by mouth daily.     [provider]  glipiZIDE (GLUCOTROL) 10 MG tablet Take 10 mg by mouth 2 (two) times daily.    [provider]  HYDROcodone-acetaminophen (NORCO/VICODIN) 5-325 MG tablet Take 1-2 tablets by mouth every 4 (four) hours as needed for moderate pain. 05/31/16   Franchot Gallo, MD  levofloxacin (LEVAQUIN) 750 MG tablet Take 750 mg by mouth once.    [provider]  lisinopril-hydrochlorothiazide (PRINZIDE,ZESTORETIC) 10-12.5 MG tablet Take 0.5 tablets by mouth daily.     [provider]  Melatonin 3 MG TABS Take 3 mg by mouth at bedtime.     [provider]  metFORMIN (GLUCOPHAGE-XR) 500 MG 24 hr tablet Take 500 mg by mouth 2 (two) times daily.     [provider]  Meth-Hyo-M Bl-Na Phos-Ph Sal (URIBEL) 118 MG CAPS Take 1 capsule (118 mg total) by mouth every 8 (eight) hours as needed. 05/31/16   Franchot Gallo, MD  methocarbamol (ROBAXIN) 500 MG tablet Take 500 mg by mouth at bedtime.    [provider]  metoprolol succinate (TOPROL-XL) 25 MG 24 hr  tablet Take 25 mg by mouth 2 (two) times daily.     [provider]  oxybutynin (DITROPAN) 5 MG tablet Take 5 mg by mouth every 8 (eight) hours as needed for bladder spasms.    [provider]  traZODone (DESYREL) 100 MG tablet Take 50 mg by mouth at bedtime.     [provider]  warfarin (COUMADIN) 10 MG tablet TAKE ONE TABLET BY MOUTH IN THE EVENING (BEDTIME) 07/05/16   [provider]  warfarin (COUMADIN) 2.5 MG tablet TAKE ONE TABLET BY MOUTH IN THE EVENING (BEDTIME) 07/05/16   [provider]    Family History Family  History  Problem Relation Age of Onset  . Diabetes Mother   . Cancer Father   . Cancer Paternal Uncle        prostate    Social History Social History   Tobacco Use  . Smoking status: Current Every Day Smoker    Packs/day: 0.50    Types: Cigarettes  . Smokeless tobacco: Never Used  Substance Use Topics  . Alcohol use: Yes    Comment: occasionally  . Drug use: No     Allergies   Patient has no known allergies.   Review of Systems Review of Systems  Respiratory: Negative for shortness of breath.   Cardiovascular: Negative for chest pain.  Genitourinary: Positive for frequency.  Neurological: Negative for headaches.  All other systems reviewed and are negative.    Physical Exam Updated Vital Signs BP (!) 90/53 (BP Location: Right Arm)   Pulse 87   Temp 98.4 F (36.9 C) (Oral)   Resp 18   Ht 6\' 3"  (1.905 m)   Wt (!) 148.3 kg (327 lb)   SpO2 98%   BMI 40.87 kg/m   Physical Exam  Constitutional: He is oriented to person, place, and time. He appears well-developed and well-nourished.  HENT:  Head: Normocephalic and atraumatic.  Eyes: Conjunctivae and EOM are normal.  Neck: Normal range of motion.  Cardiovascular: Normal rate.  Pulmonary/Chest: Effort normal. No respiratory distress.  Abdominal: Soft. He exhibits no distension.  Musculoskeletal: Normal range of motion.  Neurological: He is alert and oriented to person, place, and time. No cranial nerve deficit. Coordination normal.  Skin: Skin is warm and dry.  Nursing note and vitals reviewed.    ED Treatments / Results  Labs (all labs ordered are listed, but only abnormal results are displayed) Labs Reviewed  I-STAT CHEM 8, ED - Abnormal; Notable for the following components:      Result Value   Chloride 96 (*)    BUN 22 (*)    Creatinine, Ser 1.60 (*)    Glucose, Bld 134 (*)    All other components within normal limits  URINALYSIS, ROUTINE W REFLEX MICROSCOPIC    EKG None  Radiology No  results found.  Procedures Procedures (including critical care time)  Medications Ordered in ED Medications - No data to display   Initial Impression / Assessment and Plan / ED Course  I have reviewed the triage vital signs and the nursing notes.  Pertinent labs & imaging results that were available during my care of the patient were reviewed by me and considered in my medical decision making (see chart for details).     Will eval for urinary retention, UTI, kidney function. otherwise can follow up with urologist on Monday.  BP low on monitor but patient doesn't have any symptoms of hypotension, will recheck manually.  Patient left AMA  prior to my reevaluation or discussions.   Final Clinical Impressions(s) / ED Diagnoses   Final diagnoses:  Left against medical advice      Ophie Burrowes, Corene Cornea, MD 05/21/17 [phone]

## 2017-05-21 NOTE — ED Triage Notes (Signed)
Pt already sees a urologist for history of prostate cancer, had a turp procedure, states he has been leaking urine regularly for a couple of weeks.  Pt recently started on Morphine 15 mg.

## 2017-05-21 NOTE — ED Notes (Signed)
I-Stat Chem 8 ran, pt chose to leave AMA. Pt was explained risk of leaving AMA and verbalized understanding. No other orders were able to be completed.

## 2017-06-07 ENCOUNTER — Ambulatory Visit (INDEPENDENT_AMBULATORY_CARE_PROVIDER_SITE_OTHER): Payer: BLUE CROSS/BLUE SHIELD | Admitting: Urology

## 2017-06-07 DIAGNOSIS — C61 Malignant neoplasm of prostate: Secondary | ICD-10-CM

## 2017-06-07 DIAGNOSIS — N3281 Overactive bladder: Secondary | ICD-10-CM | POA: Diagnosis not present

## 2017-07-12 ENCOUNTER — Ambulatory Visit: Payer: BLUE CROSS/BLUE SHIELD | Admitting: Urology

## 2017-07-12 DIAGNOSIS — C61 Malignant neoplasm of prostate: Secondary | ICD-10-CM

## 2017-07-21 ENCOUNTER — Inpatient Hospital Stay (HOSPITAL_COMMUNITY)
Admission: AD | Admit: 2017-07-21 | Discharge: 2017-07-24 | DRG: 388 | Disposition: A | Discharge status: Home / Self Care | Payer: BLUE CROSS/BLUE SHIELD | Source: Other Acute Inpatient Hospital | Attending: Family Medicine | Admitting: Family Medicine

## 2017-07-21 ENCOUNTER — Inpatient Hospital Stay (HOSPITAL_COMMUNITY): Payer: BLUE CROSS/BLUE SHIELD

## 2017-07-21 ENCOUNTER — Encounter (HOSPITAL_COMMUNITY): Payer: Self-pay | Admitting: Internal Medicine

## 2017-07-21 DIAGNOSIS — Z720 Tobacco use: Secondary | ICD-10-CM | POA: Diagnosis present

## 2017-07-21 DIAGNOSIS — E119 Type 2 diabetes mellitus without complications: Secondary | ICD-10-CM | POA: Diagnosis present

## 2017-07-21 DIAGNOSIS — K5651 Intestinal adhesions [bands], with partial obstruction: Secondary | ICD-10-CM | POA: Diagnosis present

## 2017-07-21 DIAGNOSIS — G8929 Other chronic pain: Secondary | ICD-10-CM | POA: Diagnosis present

## 2017-07-21 DIAGNOSIS — F329 Major depressive disorder, single episode, unspecified: Secondary | ICD-10-CM | POA: Diagnosis present

## 2017-07-21 DIAGNOSIS — D696 Thrombocytopenia, unspecified: Secondary | ICD-10-CM | POA: Diagnosis present

## 2017-07-21 DIAGNOSIS — K8020 Calculus of gallbladder without cholecystitis without obstruction: Secondary | ICD-10-CM | POA: Diagnosis present

## 2017-07-21 DIAGNOSIS — E785 Hyperlipidemia, unspecified: Secondary | ICD-10-CM | POA: Diagnosis present

## 2017-07-21 DIAGNOSIS — E669 Obesity, unspecified: Secondary | ICD-10-CM | POA: Diagnosis present

## 2017-07-21 DIAGNOSIS — K432 Incisional hernia without obstruction or gangrene: Secondary | ICD-10-CM | POA: Diagnosis present

## 2017-07-21 DIAGNOSIS — F32A Depression, unspecified: Secondary | ICD-10-CM | POA: Diagnosis present

## 2017-07-21 DIAGNOSIS — F05 Delirium due to known physiological condition: Secondary | ICD-10-CM | POA: Diagnosis not present

## 2017-07-21 DIAGNOSIS — K56609 Unspecified intestinal obstruction, unspecified as to partial versus complete obstruction: Secondary | ICD-10-CM | POA: Diagnosis present

## 2017-07-21 DIAGNOSIS — J449 Chronic obstructive pulmonary disease, unspecified: Secondary | ICD-10-CM | POA: Diagnosis present

## 2017-07-21 DIAGNOSIS — Z7901 Long term (current) use of anticoagulants: Secondary | ICD-10-CM

## 2017-07-21 DIAGNOSIS — I482 Chronic atrial fibrillation: Secondary | ICD-10-CM | POA: Diagnosis present

## 2017-07-21 DIAGNOSIS — Z79891 Long term (current) use of opiate analgesic: Secondary | ICD-10-CM

## 2017-07-21 DIAGNOSIS — I700 Atherosclerosis of aorta: Secondary | ICD-10-CM | POA: Diagnosis present

## 2017-07-21 DIAGNOSIS — Z0189 Encounter for other specified special examinations: Secondary | ICD-10-CM

## 2017-07-21 DIAGNOSIS — F17210 Nicotine dependence, cigarettes, uncomplicated: Secondary | ICD-10-CM | POA: Diagnosis present

## 2017-07-21 DIAGNOSIS — G9341 Metabolic encephalopathy: Secondary | ICD-10-CM | POA: Diagnosis present

## 2017-07-21 DIAGNOSIS — D3501 Benign neoplasm of right adrenal gland: Secondary | ICD-10-CM | POA: Diagnosis present

## 2017-07-21 DIAGNOSIS — M549 Dorsalgia, unspecified: Secondary | ICD-10-CM | POA: Diagnosis present

## 2017-07-21 DIAGNOSIS — M1990 Unspecified osteoarthritis, unspecified site: Secondary | ICD-10-CM | POA: Diagnosis present

## 2017-07-21 DIAGNOSIS — Z79899 Other long term (current) drug therapy: Secondary | ICD-10-CM | POA: Diagnosis not present

## 2017-07-21 DIAGNOSIS — I10 Essential (primary) hypertension: Secondary | ICD-10-CM | POA: Diagnosis present

## 2017-07-21 DIAGNOSIS — Z8546 Personal history of malignant neoplasm of prostate: Secondary | ICD-10-CM | POA: Diagnosis not present

## 2017-07-21 DIAGNOSIS — R188 Other ascites: Secondary | ICD-10-CM | POA: Diagnosis present

## 2017-07-21 LAB — TYPE AND SCREEN
ABO/RH(D): O POS
ANTIBODY SCREEN: NEGATIVE

## 2017-07-21 LAB — LACTIC ACID, PLASMA: Lactic Acid, Venous: 0.7 mmol/L (ref 0.5–1.9)

## 2017-07-21 LAB — PROTIME-INR
INR: 2.58
PROTHROMBIN TIME: 27.4 s — AB (ref 11.4–15.2)

## 2017-07-21 LAB — APTT: APTT: 43 s — AB (ref 24–36)

## 2017-07-21 LAB — ABO/RH: ABO/RH(D): O POS

## 2017-07-21 LAB — PROCALCITONIN: Procalcitonin: <0.1 ng/mL

## 2017-07-21 LAB — GLUCOSE, CAPILLARY: Glucose-Capillary: 116 mg/dL — ABNORMAL HIGH (ref 65–99)

## 2017-07-21 MED ORDER — SODIUM CHLORIDE 0.9 % IV SOLN
INTRAVENOUS | Status: DC
  Administered 2017-07-21: 21:00:00 via INTRAVENOUS
  Administered 2017-07-22: 1 mL via INTRAVENOUS

## 2017-07-21 MED ORDER — MORPHINE SULFATE (PF) 2 MG/ML IV SOLN
2.0000 mg | INTRAVENOUS | Status: DC | PRN
Start: 2017-07-21 — End: 2017-07-24
  Administered 2017-07-22 – 2017-07-23 (×8): 2 mg via INTRAVENOUS
  Filled 2017-07-21 (×8): qty 1

## 2017-07-21 MED ORDER — ACETAMINOPHEN 650 MG RE SUPP: 650.0000 mg | Freq: Four times a day (QID) | RECTAL | Status: DC | PRN

## 2017-07-21 MED ORDER — INSULIN ASPART 100 UNIT/ML ~~LOC~~ SOLN: 0.0000 [IU] | Freq: Every day | SUBCUTANEOUS | Status: DC

## 2017-07-21 MED ORDER — ONDANSETRON HCL 4 MG/2ML IJ SOLN: 4.0000 mg | Freq: Three times a day (TID) | INTRAMUSCULAR | Status: DC | PRN

## 2017-07-21 MED ORDER — ALBUTEROL SULFATE (2.5 MG/3ML) 0.083% IN NEBU: 2.5000 mg | INHALATION_SOLUTION | RESPIRATORY_TRACT | Status: DC | PRN

## 2017-07-21 MED ORDER — METOPROLOL TARTRATE 5 MG/5ML IV SOLN
2.5000 mg | Freq: Three times a day (TID) | INTRAVENOUS | Status: DC
  Administered 2017-07-22 (×3): 2.5 mg via INTRAVENOUS
  Filled 2017-07-21 (×3): qty 5

## 2017-07-21 MED ORDER — NICOTINE 21 MG/24HR TD PT24
21.0000 mg | MEDICATED_PATCH | Freq: Every day | TRANSDERMAL | Status: DC
  Administered 2017-07-21 – 2017-07-22 (×2): 21 mg via TRANSDERMAL
  Filled 2017-07-21 (×2): qty 1

## 2017-07-21 MED ORDER — DIATRIZOATE MEGLUMINE & SODIUM 66-10 % PO SOLN
90.0000 mL | Freq: Once | ORAL | Status: AC
  Administered 2017-07-21: 90 mL via NASOGASTRIC
  Filled 2017-07-21: qty 90

## 2017-07-21 MED ORDER — HYDRALAZINE HCL 20 MG/ML IJ SOLN: 5.0000 mg | INTRAMUSCULAR | Status: DC | PRN

## 2017-07-21 MED ORDER — INSULIN ASPART 100 UNIT/ML ~~LOC~~ SOLN
0.0000 [IU] | Freq: Three times a day (TID) | SUBCUTANEOUS | Status: DC
  Administered 2017-07-23: 1 [IU] via SUBCUTANEOUS
  Administered 2017-07-23: 2 [IU] via SUBCUTANEOUS

## 2017-07-21 NOTE — Progress Notes (Signed)
Pt new admit from [facility], alert and oriented with left NGT connected to intermittent wall suction with greenish output, with 02 [place] at 2 liters/hr, left AC on continuous IVF at 150cc/hr, ambulatory, NPO, abdominal distention,no complain of pain at this time, [facility] MD Dr. Lorin Mercy aware.
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
## 2017-07-21 NOTE — Progress Notes (Signed)
Pt.is AX3,confused at the same time.

## 2017-07-21 NOTE — H&P (Signed)
History and Physical    Briscoe Daniello ZOX:[id number] DOB: 08/08/1960 DOA: 07/21/2017  Referring MD/NP/PA:   PCP: Karsten Ro, DO   Patient coming from:  The patient is coming from home.  At baseline, pt is independent for most of ADL.   Chief Complaint: Abdominal pain, nausea, vomiting, confusion  HPI: Dale Franklin is a 57 y.o. male with medical history significant of hypertension, hyperlipidemia, diabetes mellitus, prostate cancer, atrial fibrillation on Coumadin, tobacco abuse, obesity, depression, ventral hernia repair, multiple abdominal surgery, who presents with nausea vomiting, abdominal pain and confusion.  Patient is transfered from [facility] due to high-grade small bowel obstruction. When I saw pt the floor, patient is mildly confused,on very drowsy, but arousable.  He is oriented to place and person, not to time.  Patient complaining of abdominal pain, distention, nausea vomiting.  He cannot provide a detailed information due to confusion.  No active cough, respiratory distress noted.  Patient denies chest pain, shortness breath.  No fever or chills.  Not sure if patient has symptoms of UTI.  Patient moves all extremities normally.  No facial droop or slurred speech. Pt was initially admitted to  [facility] on 07/20/17. He was found to have WBC 18.5--> 6.5 today, lactic acid 1.8, 2.0, INR 3.1, PTT 38.8, electrolytes renal function okay. Pt was treated with IV ertapenem and Zosyn in [facility].  Pt had CT abdomen/pelvis showed: 2 ventral hernias immediately adjacent to each other slightly to the right of the midline in the upper pelvic region.  Loops of small bowel extend into this area of herniation.  There is evidence of adhesions in this area with a transition zone just beyond the more medial hernia in the mid ileal level consistent with bowel obstruction.  Localized ascites within the lateral aspect of the larger hernia.  No other ascites seen.   Adhesions also noted in the anterior right mid abdomen.  Cholelithiasis.  Evidence of previous therapy for prostate carcinoma.  Small right adrenal adenoma.  Aortic atherosclerosis.  Review of Systems:   General: no fevers, chills, no body weight gain, has fatigue HEENT: no blurry vision, hearing changes or sore throat Respiratory: no dyspnea, coughing, wheezing CV: no chest pain, no palpitations GI: has nausea, vomiting, abdominal pain and constipation, no  Diarrhea GU: no dysuria, burning on urination, increased urinary frequency, hematuria  Ext: no leg edema Neuro: no unilateral weakness, numbness, or tingling, no vision change or hearing loss. Has AMS. Skin: no rash, no skin tear. MSK: No muscle spasm, no deformity, no limitation of range of movement in spin Heme: No easy bruising.  Travel history: No recent long distant travel.  Allergy: No Known Allergies  Past Medical History:  Diagnosis Date  . Arthritis   . COPD (chronic obstructive pulmonary disease) ([place])   . Depression   . Diabetes mellitus without complication ([place])   . Dysrhythmia    A flutter  . Hypertension   . Prostate cancer [facility])     Past Surgical History:  Procedure Laterality Date  . GOLD SEED IMPLANT N/A 05/31/2016   Procedure: GOLD SEED IMPLANT;  Surgeon: Franchot Gallo, MD;  [facility];  Service: Urology;  Laterality: N/A;  . HAND SURGERY Right   . HERNIA REPAIR    . TRANSURETHRAL RESECTION OF PROSTATE N/A 05/31/2016   Procedure: TRANSURETHRAL RESECTION OF THE PROSTATE (TURP);  Surgeon: Franchot Gallo, MD;  Location: [facility];  Service: Urology;  Laterality: N/A;  . WRIST SURGERY  Right     Social History:  reports that he has been smoking cigarettes.  He has been smoking about 0.50 packs per day. He has never used smokeless tobacco. He reports that he drinks alcohol. He reports that he does not use drugs.  Family History:  Family History  Problem Relation Age of Onset  . Diabetes Mother     . Cancer Father   . Cancer Paternal Uncle        prostate     Prior to Admission medications   Medication Sig Start Date End Date Taking? Authorizing Provider  albuterol (PROVENTIL HFA;VENTOLIN HFA) 108 (90 Base) MCG/ACT inhaler Inhale 1-2 puffs into the lungs every 6 (six) hours as needed for wheezing or shortness of breath.    [provider]  DULoxetine (CYMBALTA) 30 MG capsule Take 90 mg by mouth daily.     [provider]  glipiZIDE (GLUCOTROL) 10 MG tablet Take 10 mg by mouth 2 (two) times daily.    [provider]  HYDROcodone-acetaminophen (NORCO/VICODIN) 5-325 MG tablet Take 1-2 tablets by mouth every 4 (four) hours as needed for moderate pain. 05/31/16   Franchot Gallo, MD  levofloxacin (LEVAQUIN) 750 MG tablet Take 750 mg by mouth once.    [provider]  lisinopril-hydrochlorothiazide (PRINZIDE,ZESTORETIC) 10-12.5 MG tablet Take 0.5 tablets by mouth daily.     [provider]  Melatonin 3 MG TABS Take 3 mg by mouth at bedtime.     [provider]  metFORMIN (GLUCOPHAGE-XR) 500 MG 24 hr tablet Take 500 mg by mouth 2 (two) times daily.     [provider]  Meth-Hyo-M Bl-Na Phos-Ph Sal (URIBEL) 118 MG CAPS Take 1 capsule (118 mg total) by mouth every 8 (eight) hours as needed. 05/31/16   Franchot Gallo, MD  methocarbamol (ROBAXIN) 500 MG tablet Take 500 mg by mouth at bedtime.    [provider]  metoprolol succinate (TOPROL-XL) 25 MG 24 hr tablet Take 25 mg by mouth 2 (two) times daily.     [provider]  oxybutynin (DITROPAN) 5 MG tablet Take 5 mg by mouth every 8 (eight) hours as needed for bladder spasms.    [provider]  traZODone (DESYREL) 100 MG tablet Take 50 mg by mouth at bedtime.     [provider]  warfarin (COUMADIN) 10 MG tablet TAKE ONE TABLET BY MOUTH IN THE EVENING (BEDTIME) 07/05/16   [provider]  warfarin (COUMADIN) 2.5 MG tablet TAKE ONE  TABLET BY MOUTH IN THE EVENING (BEDTIME) 07/05/16   [provider]    Physical Exam: Vitals:   07/21/17 1837 07/21/17 2111  BP: (!) 166/60 119/65  Pulse:  98  Resp: 14 17  Temp: 97.7 F (36.5 C) 98 F (36.7 C)  TempSrc: Oral   SpO2:  98%   General: Not in acute distress HEENT:       Eyes: PERRL, EOMI, no scleral icterus.       ENT: No discharge from the ears and nose, no pharynx injection, no tonsillar enlargement.        Neck: No JVD, no bruit, no mass felt. Heme: No neck lymph node enlargement. Cardiac: S1/S2, No murmurs, No gallops or rubs. Respiratory: . No rales, wheezing, rhonchi or rubs. GI:  Distended and diffused tender, no rebound pain, no organomegaly, BS present. Has multiple surgical scars from previous surgery. GU: No hematuria Ext: No pitting leg edema bilaterally. 2+DP/PT pulse bilaterally. Musculoskeletal: No joint deformities, No  joint redness or warmth, no limitation of ROM in spin. Skin: No rashes.  Neuro: confused, oriented to place and person, but to time. Cranial nerves II-XII grossly intact, moves all extremities. Psych: Patient is not psychotic, no suicidal or hemocidal ideation.  Labs on Admission: I have personally reviewed following labs and imaging studies  CBC: No results for input(s): WBC, NEUTROABS, HGB, HCT, MCV, PLT in the last 168 hours. Basic Metabolic Panel: No results for input(s): NA, K, CL, CO2, GLUCOSE, BUN, CREATININE, CALCIUM, MG, PHOS in the last 168 hours. GFR: CrCl cannot be calculated (Patient's most recent lab result is older than the maximum 21 days allowed.). Liver Function Tests: No results for input(s): AST, ALT, ALKPHOS, BILITOT, PROT, ALBUMIN in the last 168 hours. No results for input(s): LIPASE, AMYLASE in the last 168 hours. No results for input(s): AMMONIA in the last 168 hours. Coagulation Profile: Recent Labs  Lab 07/21/17 2121  INR 2.58   Cardiac Enzymes: No results for input(s): CKTOTAL, CKMB,  CKMBINDEX, TROPONINI in the last 168 hours. BNP (last 3 results) No results for input(s): PROBNP in the last 8760 hours. HbA1C: No results for input(s): HGBA1C in the last 72 hours. CBG: Recent Labs  Lab 07/21/17 2136  GLUCAP 116*   Lipid Profile: No results for input(s): CHOL, HDL, LDLCALC, TRIG, CHOLHDL, LDLDIRECT in the last 72 hours. Thyroid Function Tests: No results for input(s): TSH, T4TOTAL, FREET4, T3FREE, THYROIDAB in the last 72 hours. Anemia Panel: No results for input(s): VITAMINB12, FOLATE, FERRITIN, TIBC, IRON, RETICCTPCT in the last 72 hours. Urine analysis:    Component Value Date/Time   COLORURINE AMBER (A) 04/09/2016 1600   APPEARANCEUR CLEAR 04/09/2016 1600   LABSPEC 1.023 04/09/2016 1600   PHURINE 5.0 04/09/2016 1600   GLUCOSEU NEGATIVE 04/09/2016 1600   HGBUR NEGATIVE 04/09/2016 1600   BILIRUBINUR NEGATIVE 04/09/2016 1600   KETONESUR NEGATIVE 04/09/2016 1600   PROTEINUR NEGATIVE 04/09/2016 1600   NITRITE NEGATIVE 04/09/2016 1600   LEUKOCYTESUR NEGATIVE 04/09/2016 1600   Sepsis Labs: @LABRCNTIP (procalcitonin:4,lacticidven:4) )No results found for this or any previous visit (from the past 240 hour(s)).   Radiological Exams on Admission: No results found.   EKG: will get one.   Assessment/Plan Principal Problem:   SBO (small bowel obstruction) (HCC) Active Problems:   COPD (chronic obstructive pulmonary disease) (HCC)   Depression   Diabetes mellitus without complication ([place])   Hypertension   Tobacco abuse   Acute metabolic encephalopathy   SBO (small bowel obstruction) ([place]): Due to adhesion. Pt had leukocytosis initially, which have resolved.  Patient has been treated with IV ertapenem and Zosyn in [facility].  Currently patient does not have fever, clinically does not seem to have sepsis.  General surgeon, Dr. Redmond Pulling was consulted.  -will admit to tele bed as inpt. -continue IV zosyn -f/u Bx and check lactic acid -continue  NG tube -prn Zofran for nausea, morphine for pain -IV fluid: Normal saline 100 cc/h  Acute metabolic encephalopathy: Etiology is not clear.  Need to rule out intracranial bleeding since patient is on Coumadin.  No focal neurologic findings on physical examination.  To be due to delirium. -Frequent neuro check - Follow-up CT head -check UA and Ux -Hold all home oral medications until mental status improves.  CODP: stable. -prn albuterol nebs  Depression: -hold home meds onto mental status improves  Diabetes mellitus without complication ([place]): Last A1c 6.5 05/28/2016, well controled. Patient is taking metformin and glipizide at home -SSI  Hypertension -IV  metoprolol with holding parameters -IV hydralazine as needed  Atrial Fibrillation: CHA2DS2-VASc Score is 2, needs oral anticoagulation. Patient is on Coumadin at home. INR is  3.1 on admission. Heart rate is well controlled. -hold Coumadin in case patient needs surgery -IV metoprolol -check INR  Tobacco abuse: -Nicotine patch    DVT ppx: SCD Code Status: Full code Family Communication: None at bed side.     Disposition Plan:  Anticipate discharge back to previous home environment Consults called: General surgeon, Dr. Redmond Pulling Admission status:  Inpatient/tele      Date of Service 07/21/2017    Ivor Costa [facility] Pager [phone]  If 7PM-7AM, please contact night-coverage [email or web address] Password [email or web address] 07/21/2017, 11:01 PM

## 2017-07-21 NOTE — Progress Notes (Signed)
A new  from [place] Dx SBO, alert and oriented with left NGT connected to intermittent wall suction with greenish output, with 02 [place] at 2 liters/hr, left AC on continuous IVF at 150cc/hr, ambulatory, NPO, abdominal distention,no complain of pain at this time, triad Dr Blaine Hamper is on bed side.Dr .Redmond Pulling is aware via [place].

## 2017-07-22 ENCOUNTER — Inpatient Hospital Stay (HOSPITAL_COMMUNITY): Payer: BLUE CROSS/BLUE SHIELD

## 2017-07-22 DIAGNOSIS — F329 Major depressive disorder, single episode, unspecified: Secondary | ICD-10-CM

## 2017-07-22 DIAGNOSIS — J449 Chronic obstructive pulmonary disease, unspecified: Secondary | ICD-10-CM

## 2017-07-22 LAB — CBC
HCT: 38.5 % — ABNORMAL LOW (ref 39.0–52.0)
Hemoglobin: 12.1 g/dL — ABNORMAL LOW (ref 13.0–17.0)
MCH: 28.2 pg (ref 26.0–34.0)
MCHC: 31.4 g/dL (ref 30.0–36.0)
MCV: 89.7 fL (ref 78.0–100.0)
PLATELETS: 95 10*3/uL — AB (ref 150–400)
RBC: 4.29 MIL/uL (ref 4.22–5.81)
RDW: 14.9 % (ref 11.5–15.5)
WBC: 4.6 10*3/uL (ref 4.0–10.5)

## 2017-07-22 LAB — GLUCOSE, CAPILLARY
GLUCOSE-CAPILLARY: 104 mg/dL — AB (ref 65–99)
Glucose-Capillary: 88 mg/dL (ref 65–99)
Glucose-Capillary: 93 mg/dL (ref 65–99)
Glucose-Capillary: 83 mg/dL (ref 65–99)

## 2017-07-22 LAB — HIV ANTIBODY (ROUTINE TESTING W REFLEX): HIV Screen 4th Generation wRfx: NONREACTIVE

## 2017-07-22 LAB — BASIC METABOLIC PANEL
Anion gap: 6 (ref 5–15)
BUN: 11 mg/dL (ref 6–20)
CALCIUM: 7.9 mg/dL — AB (ref 8.9–10.3)
CO2: 27 mmol/L (ref 22–32)
CREATININE: 0.83 mg/dL (ref 0.61–1.24)
Chloride: 107 mmol/L (ref 101–111)
GFR calc non Af Amer: >60 mL/min (ref >60)
GLUCOSE: 122 mg/dL — AB (ref 65–99)
Potassium: 4.2 mmol/L (ref 3.5–5.1)
Sodium: 140 mmol/L (ref 135–145)

## 2017-07-22 LAB — URINALYSIS, ROUTINE W REFLEX MICROSCOPIC
BILIRUBIN URINE: NEGATIVE
GLUCOSE, UA: NEGATIVE mg/dL
HGB URINE DIPSTICK: NEGATIVE
KETONES UR: 5 mg/dL — AB
Leukocytes, UA: NEGATIVE
Nitrite: NEGATIVE
PH: 5 (ref 5.0–8.0)
Protein, ur: NEGATIVE mg/dL
Specific Gravity, Urine: 1.031 — ABNORMAL HIGH (ref 1.005–1.030)

## 2017-07-22 LAB — PROTIME-INR
INR: 2.15
PROTHROMBIN TIME: 23.9 s — AB (ref 11.4–15.2)

## 2017-07-22 LAB — LACTIC ACID, PLASMA: LACTIC ACID, VENOUS: 0.6 mmol/L (ref 0.5–1.9)

## 2017-07-22 MED ORDER — PIPERACILLIN-TAZOBACTAM 3.375 G IVPB
3.3750 g | Freq: Three times a day (TID) | INTRAVENOUS | Status: DC
  Administered 2017-07-22 (×3): 3.375 g via INTRAVENOUS
  Filled 2017-07-22 (×4): qty 50

## 2017-07-22 MED ORDER — PHENOL 1.4 % MT LIQD
1.0000 | OROMUCOSAL | Status: DC | PRN
  Filled 2017-07-22: qty 177

## 2017-07-22 MED ORDER — LABETALOL HCL 5 MG/ML IV SOLN
10.0000 mg | Freq: Four times a day (QID) | INTRAVENOUS | Status: DC | PRN
  Filled 2017-07-22: qty 4

## 2017-07-22 NOTE — Progress Notes (Signed)
Patient ID: Dale Franklin, male   DOB: July 18, 1960, 57 y.o.   MRN: [id number]       Subjective: Patient still very tender to palpation.  Not passing flatus or BMs.  Objective: Vital signs in last 24 hours: Temp:  [97.7 F (36.5 C)-98 F (36.7 C)] 98 F (36.7 C) (06/07 0500) Pulse Rate:  [98] 98 (06/07 0500) Resp:  [14-17] 17 (06/06 2111) BP: (119-166)/(60-84) 130/84 (06/07 0500) SpO2:  [98 %] 98 % (06/07 0500)    Intake/Output from previous day: 06/06 0701 - 06/07 0700 In: 0  Out: 900 [Urine:400; Emesis/NG output:500] Intake/Output this shift: No intake/output data recorded.  PE: Heart: irregularly irregular Lungs: CTAB Abd: morbidly obese, tender especially over the central portion of his abdomen. +BS, NGT with bilious output. 500cc documented since arrival overnight.  Lab Results:  No results for input(s): WBC, HGB, HCT, PLT in the last 72 hours. BMET No results for input(s): NA, K, CL, CO2, GLUCOSE, BUN, CREATININE, CALCIUM in the last 72 hours. PT/INR Recent Labs    07/21/17 2121  LABPROT 27.4*  INR 2.58   CMP     Component Value Date/Time   NA 138 05/21/2017 0321   K 4.3 05/21/2017 0321   CL 96 (L) 05/21/2017 0321   CO2 33 (H) 05/28/2016 1407   GLUCOSE 134 (H) 05/21/2017 0321   BUN 22 (H) 05/21/2017 0321   CREATININE 1.60 (H) 05/21/2017 0321   CALCIUM 9.7 05/28/2016 1407   GFRNONAA >60 05/28/2016 1407   GFRAA >60 05/28/2016 1407   Lipase  No results found for: LIPASE     Studies/Results: Ct Head Wo Contrast  Result Date: 07/21/2017 CLINICAL DATA:  Altered mental status EXAM: CT HEAD WITHOUT CONTRAST TECHNIQUE: Contiguous axial images were obtained from the base of the skull through the vertex without intravenous contrast. COMPARISON:  None. FINDINGS: Brain: There is no mass, hemorrhage or extra-axial collection. The size and configuration of the ventricles and extra-axial CSF spaces are normal. There is no acute or chronic infarction. The brain  parenchyma is normal. Vascular: No abnormal hyperdensity of the major intracranial arteries or dural venous sinuses. No intracranial atherosclerosis. Skull: The visualized skull base, calvarium and extracranial soft tissues are normal. Sinuses/Orbits: No fluid levels or advanced mucosal thickening of the visualized paranasal sinuses. No mastoid or middle ear effusion. The orbits are normal. IMPRESSION: Normal head CT. Electronically Signed   By: Ulyses Jarred M.D.   On: 07/21/2017 23:13   Dg Abd Portable 1v-small Bowel Protocol-position Verification  Result Date: 07/21/2017 CLINICAL DATA:  NG tube placement. EXAM: PORTABLE ABDOMEN - 1 VIEW COMPARISON:  None. FINDINGS: Enteric tube in place with the tip near the gastric antrum. Paucity of bowel gas. IMPRESSION: Enteric tube within the stomach. Electronically Signed   By: Titus Dubin M.D.   On: 07/21/2017 23:50    Anti-infectives: Anti-infectives (From admission, onward)   Start     Dose/Rate Route Frequency Ordered Stop   07/22/17 0015  piperacillin-tazobactam (ZOSYN) IVPB 3.375 g     3.375 g 12.5 mL/hr over 240 Minutes Intravenous Every 8 hours 07/22/17 0007         Assessment/Plan Partial small bowel obstruction Incisional hernias -patient is on SBO protocol.  Films this morning aren't read, but appear to have contrast in his colon.  Cont NGT for today as he isn't passing anything from below and he still has pain. -repeat film in the morning -cont conservative management to try and avoid any type os surgical  intervention in this patient who is high risk for complications. -labs are pending for today, CBC/BMET  Severe obesity Afib - on chronic anticoagulation, INR 2.58 yesterday HTN Diabetes mellitus 2 Back pain on chronic narcotics HPL Tobacco use/COPD AMS - CT head negative  FEN - NPO/NGT/IVFs VTE - SCDs/coumadin on hold ID - Zosyn   LOS: 1 day    Henreitta Cea , [facility] 07/22/2017, 8:57  AM Pager: [phone]

## 2017-07-22 NOTE — Progress Notes (Signed)
Pharmacy Antibiotic Note  Dale Franklin is a 57 y.o. male admitted on 07/21/2017 with intra-abdominal infection.  Pharmacy has been consulted for Zosyn dosing. Transfer from outside hospital. Per MD note, creatinine normal and last WBC 6.1 at outside hospital.   Plan: Zosyn 3.375G IV q8h to be infused over 4 hours Trend WBC, temp, renal function  F/U infectious work-up  Temp (24hrs), Avg:97.9 F (36.6 C), Min:97.7 F (36.5 C), Max:98 F (36.7 C)  Recent Labs  Lab 07/21/17 2121  LATICACIDVEN 0.7    CrCl cannot be calculated (Patient's most recent lab result is older than the maximum 21 days allowed.).    No Known Allergies   Narda Bonds 07/22/2017 12:03 AM

## 2017-07-22 NOTE — Progress Notes (Signed)
PROGRESS NOTE    Dale Franklin  YHC:[id number] DOB: 1960/08/22 DOA: 07/21/2017 PCP: Karsten Ro, DO      Brief Narrative:  Dale Franklin is a 57 y.o. M with HTN, DM, Afib on warfarin, obesity, ventral hernia repair and recurrent SBO who presents with abdominal pain, vomiting, found on CT to have SBO.     Assessment & Plan:  SBO NG tube in place -Gastrograffin protocol -IVF -Continue morphine 2 mg IV q4hrs PRN -Stop antibiotics  Acute metabolic encephalopathy Resolved.  Atrial fibrillation CHADS2Vasc 2.  On warfarin. -Hold warfarin -Continue Labetalol as needed for HR >110 or SBP >170  Diabetes -Continue SSI q4hrs -Hold metformin, glipizide  COPD No active disease -Albuterol PRN  Depression -Restart duloxetine when able to take PO  Hypertension -Hold HCTZ, ACEi, statin while unable to take PO   Thrombocytopenia Mild. -Trend CBC  DVT prophylaxis: N/A Code Status: FULL Family Communication: None present MDM and disposition Plan: The below labs and imaging reports were reviewed and summarized above.    The patient was admitted with a bowel obstruction, general surgery been consulted, Gastrografin protocol has been initiated.  He has no sources of infection, no surgical abdomen, no high-grade obstruction, he appears to be improving.   Consultants:   Surgery  Procedures:   Gastrografin series  Antimicrobials:   Zosyn x1   Subjective: The patient is feeling quite a bit better, he has had no flatus or bowel movement.  His abdomen is less distended, he has less pain.  He has had no vomiting, no fever, no more confusion.  Objective: Vitals:   07/21/17 1837 07/21/17 2111 07/22/17 0500 07/22/17 1436  BP: (!) 166/60 119/65 130/84 (!) 150/91  Pulse:  98 98 99  Resp: 14 17  20   Temp: 97.7 F (36.5 C) 98 F (36.7 C) 98 F (36.7 C) 98.2 F (36.8 C)  TempSrc: Oral Oral Oral Oral  SpO2:  98% 98% 96%    Intake/Output Summary (Last 24 hours) at  07/22/2017 1602 Last data filed at 07/22/2017 1500 Gross per 24 hour  Intake 0 ml  Output 1975 ml  Net -1975 ml   There were no vitals filed for this visit.  Examination: General appearance: Obese adult male, alert and in acute distress.   HEENT: Anicteric, conjunctiva pink, lids and lashes normal. No nasal deformity, discharge, epistaxis.  Lips moist, teeth normal, oropharynx moist, no oral lesions.  NG tube in place, hearing normal.   Skin: Warm and dry.  No jaundice.  No suspicious rashes or lesions. Cardiac: RRR, nl S1-S2, no murmurs appreciated.  Capillary refill is brisk.  JVP not visible.  No LE edema.  Radial pulses 2+ and symmetric. Respiratory: Normal respiratory rate and rhythm.  CTAB without rales or wheezes. Abdomen: Abdomen soft.  Mild right sided TTP voluntary guarding, no rigidity, no rebound. No ascites, hepatosplenomegaly.  No distention.  Some old ventral surgical scars. MSK: No deformities or effusions the large joints of the upper lower extremities bilaterally. Neuro: Awake and alert.  EOMI, moves all extremities. Speech fluent.    Psych: Sensorium intact and responding to questions, attention normal. Affect normal.  Judgment and insight appear normal.    Data Reviewed: I have personally reviewed following labs and imaging studies:  CBC: Recent Labs  Lab 07/22/17 0831  WBC 4.6  HGB 12.1*  HCT 38.5*  MCV 89.7  PLT 95*   Basic Metabolic Panel: Recent Labs  Lab 07/22/17 0831  NA 140  K 4.2  CL 107  CO2 27  GLUCOSE 122*  BUN 11  CREATININE 0.83  CALCIUM 7.9*   GFR: CrCl cannot be calculated (Unknown ideal weight.). Liver Function Tests: No results for input(s): AST, ALT, ALKPHOS, BILITOT, PROT, ALBUMIN in the last 168 hours. No results for input(s): LIPASE, AMYLASE in the last 168 hours. No results for input(s): AMMONIA in the last 168 hours. Coagulation Profile: Recent Labs  Lab 07/21/17 2121 07/22/17 0831  INR 2.58 2.15   Cardiac Enzymes: No  results for input(s): CKTOTAL, CKMB, CKMBINDEX, TROPONINI in the last 168 hours. BNP (last 3 results) No results for input(s): PROBNP in the last 8760 hours. HbA1C: No results for input(s): HGBA1C in the last 72 hours. CBG: Recent Labs  Lab 07/21/17 2136 07/22/17 0756 07/22/17 1215  GLUCAP 116* 104* 88   Lipid Profile: No results for input(s): CHOL, HDL, LDLCALC, TRIG, CHOLHDL, LDLDIRECT in the last 72 hours. Thyroid Function Tests: No results for input(s): TSH, T4TOTAL, FREET4, T3FREE, THYROIDAB in the last 72 hours. Anemia Panel: No results for input(s): VITAMINB12, FOLATE, FERRITIN, TIBC, IRON, RETICCTPCT in the last 72 hours. Urine analysis:    Component Value Date/Time   COLORURINE YELLOW 07/21/2017 2230   APPEARANCEUR HAZY (A) 07/21/2017 2230   LABSPEC 1.031 (H) 07/21/2017 2230   PHURINE 5.0 07/21/2017 2230   GLUCOSEU NEGATIVE 07/21/2017 2230   HGBUR NEGATIVE 07/21/2017 2230   BILIRUBINUR NEGATIVE 07/21/2017 2230   KETONESUR 5 (A) 07/21/2017 2230   PROTEINUR NEGATIVE 07/21/2017 2230   NITRITE NEGATIVE 07/21/2017 2230   LEUKOCYTESUR NEGATIVE 07/21/2017 2230   Sepsis Labs: @LABRCNTIP (procalcitonin:4,lacticacidven:4)  ) Recent Results (from the past 240 hour(s))  Culture, blood (Routine X 2) w Reflex to ID Panel     Status: None (Preliminary result)   Collection Time: 07/21/17  9:15 PM  Result Value Ref Range Status   Specimen Description BLOOD RIGHT ANTECUBITAL  Final   Special Requests   Final    BOTTLES DRAWN AEROBIC ONLY Blood Culture adequate volume   Culture   Final    NO GROWTH < 24 HOURS Performed at [facility], [place] [address], [place], [place] [address]    Report Status PENDING  Incomplete  Culture, blood (Routine X 2) w Reflex to ID Panel     Status: None (Preliminary result)   Collection Time: 07/21/17  9:20 PM  Result Value Ref Range Status   Specimen Description BLOOD RIGHT ANTECUBITAL  Final   Special Requests   Final    BOTTLES DRAWN  AEROBIC ONLY Blood Culture adequate volume   Culture   Final    NO GROWTH < 24 HOURS Performed at [facility], [place] [address], [place], [place] [address]    Report Status PENDING  Incomplete         Radiology Studies: Ct Head Wo Contrast  Result Date: 07/21/2017 CLINICAL DATA:  Altered mental status EXAM: CT HEAD WITHOUT CONTRAST TECHNIQUE: Contiguous axial images were obtained from the base of the skull through the vertex without intravenous contrast. COMPARISON:  None. FINDINGS: Brain: There is no mass, hemorrhage or extra-axial collection. The size and configuration of the ventricles and extra-axial CSF spaces are normal. There is no acute or chronic infarction. The brain parenchyma is normal. Vascular: No abnormal hyperdensity of the major intracranial arteries or dural venous sinuses. No intracranial atherosclerosis. Skull: The visualized skull base, calvarium and extracranial soft tissues are normal. Sinuses/Orbits: No fluid levels or advanced mucosal thickening of the visualized paranasal sinuses.  No mastoid or middle ear effusion. The orbits are normal. IMPRESSION: Normal head CT. Electronically Signed   By: Ulyses Jarred M.D.   On: 07/21/2017 23:13   Dg Abd Portable 1v-small Bowel Obstruction Protocol-initial, 8 Hr Delay  Result Date: 07/22/2017 CLINICAL DATA:  Small bowel obstruction. EXAM: PORTABLE ABDOMEN - 1 VIEW COMPARISON:  One-view abdomen 07/21/2017. FINDINGS: Dilated loops of small bowel are present in the right-sided the abdomen. The stomach is decompressed. The side port of the NG tube is in the fundus the stomach. Contrast in the seen within the transverse colon. IMPRESSION: 1. Dilated loops of small bowel in the right-sided the abdomen with contrast in the colon. This suggests adynamic ileus without obstruction. Electronically Signed   By: San Morelle M.D.   On: 07/22/2017 09:10   Dg Abd Portable 1v-small Bowel Protocol-position Verification  Result Date:  07/21/2017 CLINICAL DATA:  NG tube placement. EXAM: PORTABLE ABDOMEN - 1 VIEW COMPARISON:  None. FINDINGS: Enteric tube in place with the tip near the gastric antrum. Paucity of bowel gas. IMPRESSION: Enteric tube within the stomach. Electronically Signed   By: Titus Dubin M.D.   On: 07/21/2017 23:50        Scheduled Meds: . insulin aspart  0-5 Units Subcutaneous QHS  . insulin aspart  0-9 Units Subcutaneous TID WC  . metoprolol tartrate  2.5 mg Intravenous Q8H  . nicotine  21 mg Transdermal Daily   Continuous Infusions: . sodium chloride 1 mL (07/22/17 1219)  . piperacillin-tazobactam (ZOSYN)  IV 3.375 g (07/22/17 1428)     LOS: 1 day    Time spent: 25 minutes    Edwin Dada, MD [facility] 07/22/2017, 4:02 PM     Pager [phone] --- please page though AMION:  [email or web address] If 7PM-7AM, please contact night-coverage

## 2017-07-22 NOTE — Progress Notes (Signed)
Pt,NG tube was clumped for an hour.

## 2017-07-23 ENCOUNTER — Inpatient Hospital Stay (HOSPITAL_COMMUNITY): Payer: BLUE CROSS/BLUE SHIELD

## 2017-07-23 LAB — CBC
HEMATOCRIT: 35.7 % — AB (ref 39.0–52.0)
Hemoglobin: 11.3 g/dL — ABNORMAL LOW (ref 13.0–17.0)
MCH: 28 pg (ref 26.0–34.0)
MCHC: 31.7 g/dL (ref 30.0–36.0)
MCV: 88.4 fL (ref 78.0–100.0)
PLATELETS: 132 10*3/uL — AB (ref 150–400)
RBC: 4.04 MIL/uL — AB (ref 4.22–5.81)
RDW: 14.4 % (ref 11.5–15.5)
WBC: 5.1 10*3/uL (ref 4.0–10.5)

## 2017-07-23 LAB — PROTIME-INR
INR: 1.77
Prothrombin Time: 20.5 seconds — ABNORMAL HIGH (ref 11.4–15.2)

## 2017-07-23 LAB — BASIC METABOLIC PANEL
ANION GAP: 9 (ref 5–15)
BUN: 12 mg/dL (ref 6–20)
CO2: 27 mmol/L (ref 22–32)
Calcium: 8.3 mg/dL — ABNORMAL LOW (ref 8.9–10.3)
Chloride: 106 mmol/L (ref 101–111)
Creatinine, Ser: 0.89 mg/dL (ref 0.61–1.24)
GFR calc Af Amer: >60 mL/min (ref >60)
GLUCOSE: 109 mg/dL — AB (ref 65–99)
POTASSIUM: 3.5 mmol/L (ref 3.5–5.1)
Sodium: 142 mmol/L (ref 135–145)

## 2017-07-23 LAB — GLUCOSE, CAPILLARY
GLUCOSE-CAPILLARY: 140 mg/dL — AB (ref 65–99)
GLUCOSE-CAPILLARY: 185 mg/dL — AB (ref 65–99)
Glucose-Capillary: 101 mg/dL — ABNORMAL HIGH (ref 65–99)
Glucose-Capillary: 181 mg/dL — ABNORMAL HIGH (ref 65–99)

## 2017-07-23 MED ORDER — HYDROCHLOROTHIAZIDE 10 MG/ML ORAL SUSPENSION
6.2500 mg | Freq: Every day | ORAL | Status: DC
  Administered 2017-07-23 – 2017-07-24 (×2): 6.25 mg via ORAL
  Filled 2017-07-23 (×2): qty 1.25

## 2017-07-23 MED ORDER — WARFARIN SODIUM 2.5 MG PO TABS
12.5000 mg | ORAL_TABLET | Freq: Once | ORAL | Status: AC
  Administered 2017-07-23: 12.5 mg via ORAL
  Filled 2017-07-23: qty 1

## 2017-07-23 MED ORDER — HALOPERIDOL LACTATE 5 MG/ML IJ SOLN: 2.0000 mg | Freq: Once | INTRAMUSCULAR | Status: DC

## 2017-07-23 MED ORDER — SIMVASTATIN 10 MG PO TABS
10.0000 mg | ORAL_TABLET | Freq: Every day | ORAL | Status: DC
  Administered 2017-07-23: 10 mg via ORAL
  Filled 2017-07-23: qty 1

## 2017-07-23 MED ORDER — LISINOPRIL 5 MG PO TABS
5.0000 mg | ORAL_TABLET | Freq: Every day | ORAL | Status: DC
  Administered 2017-07-23 – 2017-07-24 (×2): 5 mg via ORAL
  Filled 2017-07-23 (×2): qty 1

## 2017-07-23 MED ORDER — MORPHINE SULFATE 15 MG PO TABS
15.0000 mg | ORAL_TABLET | Freq: Four times a day (QID) | ORAL | Status: DC | PRN
  Administered 2017-07-23 – 2017-07-24 (×3): 15 mg via ORAL
  Filled 2017-07-23 (×3): qty 1

## 2017-07-23 MED ORDER — WARFARIN - PHARMACIST DOSING INPATIENT: Freq: Every day | Status: DC

## 2017-07-23 MED ORDER — MELATONIN 3 MG PO TABS
3.0000 mg | ORAL_TABLET | Freq: Every day | ORAL | Status: DC
  Administered 2017-07-23: 3 mg via ORAL
  Filled 2017-07-23: qty 1

## 2017-07-23 MED ORDER — LISINOPRIL-HYDROCHLOROTHIAZIDE 10-12.5 MG PO TABS: 0.5000 | ORAL_TABLET | Freq: Every day | ORAL | Status: DC

## 2017-07-23 MED ORDER — METOPROLOL SUCCINATE ER 25 MG PO TB24
25.0000 mg | ORAL_TABLET | Freq: Two times a day (BID) | ORAL | Status: DC
  Administered 2017-07-23 – 2017-07-24 (×3): 25 mg via ORAL
  Filled 2017-07-23 (×3): qty 1

## 2017-07-23 MED ORDER — DULOXETINE HCL 60 MG PO CPEP
90.0000 mg | ORAL_CAPSULE | Freq: Every day | ORAL | Status: DC
  Administered 2017-07-23 – 2017-07-24 (×2): 90 mg via ORAL
  Filled 2017-07-23 (×2): qty 1

## 2017-07-23 NOTE — Progress Notes (Signed)
Patient ID: Dale Franklin, male   DOB: 03-27-1960, 57 y.o.   MRN: [id number]     Subjective: Feels much better.  Has had 2 good bowel movements last night and this morning.  Denies abdominal pain.  Objective: Vital signs in last 24 hours: Temp:  [98.2 F (36.8 C)-98.4 F (36.9 C)] 98.4 F (36.9 C) (06/08 0600) Pulse Rate:  [97-104] 97 (06/08 0600) Resp:  [18-20] 18 (06/08 0600) BP: (120-152)/(56-93) 120/56 (06/08 0600) SpO2:  [94 %-96 %] 94 % (06/08 0600)    Intake/Output from previous day: 06/07 0701 - 06/08 0700 In: 3123.3 [P.O.:40; I.V.:3083.3] Out: 4075 [Urine:475; Emesis/NG output:3600] Intake/Output this shift: No intake/output data recorded.  General appearance: alert, cooperative, no distress and morbidly obese GI: Obese, soft and nondistended.  Nontender.  Incisional hernia completely reducible and nontender.  Lab Results:  Recent Labs    07/22/17 0831 07/23/17 0502  WBC 4.6 5.1  HGB 12.1* 11.3*  HCT 38.5* 35.7*  PLT 95* 132*   BMET Recent Labs    07/22/17 0831 07/23/17 0502  NA 140 142  K 4.2 3.5  CL 107 106  CO2 27 27  GLUCOSE 122* 109*  BUN 11 12  CREATININE 0.83 0.89  CALCIUM 7.9* 8.3*     Studies/Results: Ct Head Wo Contrast  Result Date: 07/21/2017 CLINICAL DATA:  Altered mental status EXAM: CT HEAD WITHOUT CONTRAST TECHNIQUE: Contiguous axial images were obtained from the base of the skull through the vertex without intravenous contrast. COMPARISON:  None. FINDINGS: Brain: There is no mass, hemorrhage or extra-axial collection. The size and configuration of the ventricles and extra-axial CSF spaces are normal. There is no acute or chronic infarction. The brain parenchyma is normal. Vascular: No abnormal hyperdensity of the major intracranial arteries or dural venous sinuses. No intracranial atherosclerosis. Skull: The visualized skull base, calvarium and extracranial soft tissues are normal. Sinuses/Orbits: No fluid levels or advanced mucosal  thickening of the visualized paranasal sinuses. No mastoid or middle ear effusion. The orbits are normal. IMPRESSION: Normal head CT. Electronically Signed   By: Ulyses Jarred M.D.   On: 07/21/2017 23:13   Dg Abd Portable 1v-small Bowel Obstruction Protocol-initial, 8 Hr Delay  Result Date: 07/22/2017 CLINICAL DATA:  Small bowel obstruction. EXAM: PORTABLE ABDOMEN - 1 VIEW COMPARISON:  One-view abdomen 07/21/2017. FINDINGS: Dilated loops of small bowel are present in the right-sided the abdomen. The stomach is decompressed. The side port of the NG tube is in the fundus the stomach. Contrast in the seen within the transverse colon. IMPRESSION: 1. Dilated loops of small bowel in the right-sided the abdomen with contrast in the colon. This suggests adynamic ileus without obstruction. Electronically Signed   By: San Morelle M.D.   On: 07/22/2017 09:10   Dg Abd Portable 1v-small Bowel Protocol-position Verification  Result Date: 07/21/2017 CLINICAL DATA:  NG tube placement. EXAM: PORTABLE ABDOMEN - 1 VIEW COMPARISON:  None. FINDINGS: Enteric tube in place with the tip near the gastric antrum. Paucity of bowel gas. IMPRESSION: Enteric tube within the stomach. Electronically Signed   By: Titus Dubin M.D.   On: 07/21/2017 23:50   Abdominal x-ray today not yet read but small bowel distention appears better and contrast is in:.  Anti-infectives: Anti-infectives (From admission, onward)   Start     Dose/Rate Route Frequency Ordered Stop   07/22/17 0015  piperacillin-tazobactam (ZOSYN) IVPB 3.375 g  Status:  Discontinued     3.375 g 12.5 mL/hr over 240 Minutes Intravenous Every 8  hours 07/22/17 0007 07/22/17 1617      Assessment/Plan: Episode of small bowel obstruction, likely temporary incarceration of his incisional hernia.  Clinically resolved.  Discontinue NG tube and start clear liquid diet.  Possibly home tomorrow.    LOS: 2 days    Dale Franklin 07/23/2017

## 2017-07-23 NOTE — Progress Notes (Signed)
PROGRESS NOTE    Dale Franklin  VVO:[id number] DOB: 1960/04/18 DOA: 07/21/2017 PCP: Karsten Ro, DO      Brief Narrative:  Dale Franklin is a 57 y.o. M with HTN, DM, Afib on warfarin, obesity, ventral hernia repair and recurrent SBO who presents with abdominal pain, vomiting, found on CT to have SBO.     Assessment & Plan:  SBO Resolving.  S/p gastrograffin prot.  BM overnight.  NG tube removed this rmoning, tolerating clears. -ADAT -If tolerating, home tomorrow  Acute metabolic encephalopathy Overnight, was agitated, required Haldol IV.  Today he is completely normal.  Has no recollection of this. Delirium precautions:   -Lights and TV off, minimize interruptions at night  -Blinds open and lights on during day  -Glasses/hearing aid with patient  -Frequent reorientation  -PT/OT when able  -Avoid sedation medications/[place] list medications    Atrial fibrillation CHADS2Vasc 2.  On warfarin. -Resume warfarin -Resume home metoprolol -Continue Labetalol as needed for HR >110 or SBP >170  Diabetes -SSI with meals -Hold metformin, glipizide  COPD No active disease -Albuterol PRN  Depression -Restart duloxetine  Hypertension -Restart HCTZ, ACE inhibitor, statin   Thrombocytopenia Mild.  Improved. -Trend CBC   Chronic pain -Resume home morphine      DVT prophylaxis: Restart warfarin Code Status: FULL Family Communication: Uncle at the bedside MDM and disposition Plan: The below labs and imaging reports were reviewed and summarized above.  Patient was admitted with a bowel obstruction, general surgery was consulted and the Gastrografin protocol was initiated.  He appears to be improving, if he is able to advanced outside food by tomorrow, will discharge home.  He has persistent sundowning, will attempt to minimize delirium.       Consultants:   Surgery  Procedures:   Gastrografin series  Antimicrobials:   Zosyn x1   Subjective: He  feels considerably better, he is ambulate in the room, had a bowel movement.  Abdomen is less painful, less distended.  No vomiting, fever.  This morning he was agitated, nursing required Haldol for sedation.  Objective: Vitals:   07/22/17 1436 07/22/17 2221 07/23/17 0600 07/23/17 1412  BP: (!) 150/91 (!) 152/93 (!) 120/56 (!) 148/86  Pulse: 99 (!) 104 97 80  Resp: 20 19 18    Temp: 98.2 F (36.8 C) 98.2 F (36.8 C) 98.4 F (36.9 C) 98.6 F (37 C)  TempSrc: Oral Oral Oral Oral  SpO2: 96% 95% 94% 97%    Intake/Output Summary (Last 24 hours) at 07/23/2017 1710 Last data filed at 07/23/2017 0908 Gross per 24 hour  Intake 1266.67 ml  Output 2400 ml  Net -1133.33 ml   There were no vitals filed for this visit.  Examination: General appearance: Obese male, ambulating in room, interactive, pleasant. HEENT: Anicteric, conjunctival pink, lids and lashes normal, no nasal deformity, discharge, epistaxis.  Lips moist, dentition poor, oropharynx moist, no oral lesions.  NG tube is been removed. Skin: Skin warm and dry, no suspicious rashes or lesions. Cardiac: Regular rate and rhythm, no murmurs, no lower extremity edema. Respiratory: Respirations easy and unlabored, lungs clear without rales or wheezes. Abdomen: Abdomen soft, mild right-sided tenderness to palpation in the right lower quadrant, without guarding, rigidity, rebound.  No ascites or hepatospleno megaly.  No distention.  Uneventful surgical scars. MSK: No deformities or effusions the large joints of the upper lower extremities bilaterally. Neuro: Ambulating normally, gait normal.  Moves all extremities with normal strength and symmetric strength.  Speech fluent.  Cranial nerves normal.    Psych: Sensorium intact and responding to questions, oriented to person, place, and time.  Attention normal.  Affect pleasant.  Judgment and insight appear normal.    Data Reviewed: I have personally reviewed following labs and imaging  studies:  CBC: Recent Labs  Lab 07/22/17 0831 07/23/17 0502  WBC 4.6 5.1  HGB 12.1* 11.3*  HCT 38.5* 35.7*  MCV 89.7 88.4  PLT 95* 878*   Basic Metabolic Panel: Recent Labs  Lab 07/22/17 0831 07/23/17 0502  NA 140 142  K 4.2 3.5  CL 107 106  CO2 27 27  GLUCOSE 122* 109*  BUN 11 12  CREATININE 0.83 0.89  CALCIUM 7.9* 8.3*   GFR: CrCl cannot be calculated (Unknown ideal weight.). Liver Function Tests: No results for input(s): AST, ALT, ALKPHOS, BILITOT, PROT, ALBUMIN in the last 168 hours. No results for input(s): LIPASE, AMYLASE in the last 168 hours. No results for input(s): AMMONIA in the last 168 hours. Coagulation Profile: Recent Labs  Lab 07/21/17 2121 07/22/17 0831 07/23/17 0502  INR 2.58 2.15 1.77   Cardiac Enzymes: No results for input(s): CKTOTAL, CKMB, CKMBINDEX, TROPONINI in the last 168 hours. BNP (last 3 results) No results for input(s): PROBNP in the last 8760 hours. HbA1C: No results for input(s): HGBA1C in the last 72 hours. CBG: Recent Labs  Lab 07/22/17 1215 07/22/17 1658 07/22/17 2115 07/23/17 0838 07/23/17 1232  GLUCAP 88 93 83 101* 181*   Lipid Profile: No results for input(s): CHOL, HDL, LDLCALC, TRIG, CHOLHDL, LDLDIRECT in the last 72 hours. Thyroid Function Tests: No results for input(s): TSH, T4TOTAL, FREET4, T3FREE, THYROIDAB in the last 72 hours. Anemia Panel: No results for input(s): VITAMINB12, FOLATE, FERRITIN, TIBC, IRON, RETICCTPCT in the last 72 hours. Urine analysis:    Component Value Date/Time   COLORURINE YELLOW 07/21/2017 2230   APPEARANCEUR HAZY (A) 07/21/2017 2230   LABSPEC 1.031 (H) 07/21/2017 2230   PHURINE 5.0 07/21/2017 2230   GLUCOSEU NEGATIVE 07/21/2017 2230   HGBUR NEGATIVE 07/21/2017 2230   BILIRUBINUR NEGATIVE 07/21/2017 2230   KETONESUR 5 (A) 07/21/2017 2230   PROTEINUR NEGATIVE 07/21/2017 2230   NITRITE NEGATIVE 07/21/2017 2230   LEUKOCYTESUR NEGATIVE 07/21/2017 2230   Sepsis  Labs: @LABRCNTIP (procalcitonin:4,lacticacidven:4)  ) Recent Results (from the past 240 hour(s))  Culture, blood (Routine X 2) w Reflex to ID Panel     Status: None (Preliminary result)   Collection Time: 07/21/17  9:15 PM  Result Value Ref Range Status   Specimen Description BLOOD RIGHT ANTECUBITAL  Final   Special Requests   Final    BOTTLES DRAWN AEROBIC ONLY Blood Culture adequate volume   Culture   Final    NO GROWTH 2 DAYS Performed at [facility], [place] [address] [place], [place] [address]    Report Status PENDING  Incomplete  Culture, blood (Routine X 2) w Reflex to ID Panel     Status: None (Preliminary result)   Collection Time: 07/21/17  9:20 PM  Result Value Ref Range Status   Specimen Description BLOOD RIGHT ANTECUBITAL  Final   Special Requests   Final    BOTTLES DRAWN AEROBIC ONLY Blood Culture adequate volume   Culture   Final    NO GROWTH 2 DAYS Performed at [facility], [place] [address], [place], [place] [address]    Report Status PENDING  Incomplete         Radiology Studies: Dg Abd 1 View  Result  Date: 07/23/2017 CLINICAL DATA:  Small-bowel obstruction EXAM: ABDOMEN - 1 VIEW COMPARISON:  07/22/2017 FINDINGS: NG tube within the stomach. Heart is enlarged. Minor basilar atelectasis. No large effusion. Persistent gaseous distention of small bowel in the right abdomen. There is contrast throughout the nondilated colon. Little interval change compared to 07/22/2017. IMPRESSION: Similar dilated small bowel in the right abdomen with contrast throughout the colon, more compatible with ileus than obstruction. Electronically Signed   By: Jerilynn Mages.  Shick M.D.   On: 07/23/2017 09:51   Ct Head Wo Contrast  Result Date: 07/21/2017 CLINICAL DATA:  Altered mental status EXAM: CT HEAD WITHOUT CONTRAST TECHNIQUE: Contiguous axial images were obtained from the base of the skull through the vertex without intravenous contrast. COMPARISON:  None. FINDINGS: Brain: There is no  mass, hemorrhage or extra-axial collection. The size and configuration of the ventricles and extra-axial CSF spaces are normal. There is no acute or chronic infarction. The brain parenchyma is normal. Vascular: No abnormal hyperdensity of the major intracranial arteries or dural venous sinuses. No intracranial atherosclerosis. Skull: The visualized skull base, calvarium and extracranial soft tissues are normal. Sinuses/Orbits: No fluid levels or advanced mucosal thickening of the visualized paranasal sinuses. No mastoid or middle ear effusion. The orbits are normal. IMPRESSION: Normal head CT. Electronically Signed   By: Ulyses Jarred M.D.   On: 07/21/2017 23:13   Dg Abd Portable 1v-small Bowel Obstruction Protocol-initial, 8 Hr Delay  Result Date: 07/22/2017 CLINICAL DATA:  Small bowel obstruction. EXAM: PORTABLE ABDOMEN - 1 VIEW COMPARISON:  One-view abdomen 07/21/2017. FINDINGS: Dilated loops of small bowel are present in the right-sided the abdomen. The stomach is decompressed. The side port of the NG tube is in the fundus the stomach. Contrast in the seen within the transverse colon. IMPRESSION: 1. Dilated loops of small bowel in the right-sided the abdomen with contrast in the colon. This suggests adynamic ileus without obstruction. Electronically Signed   By: San Morelle M.D.   On: 07/22/2017 09:10   Dg Abd Portable 1v-small Bowel Protocol-position Verification  Result Date: 07/21/2017 CLINICAL DATA:  NG tube placement. EXAM: PORTABLE ABDOMEN - 1 VIEW COMPARISON:  None. FINDINGS: Enteric tube in place with the tip near the gastric antrum. Paucity of bowel gas. IMPRESSION: Enteric tube within the stomach. Electronically Signed   By: Titus Dubin M.D.   On: 07/21/2017 23:50        Scheduled Meds: . DULoxetine  90 mg Oral Daily  . haloperidol lactate  2 mg Intravenous Once  . lisinopril  5 mg Oral Daily   And  . hydrochlorothiazide  6.25 mg Oral Daily  . insulin aspart  0-5 Units  Subcutaneous QHS  . insulin aspart  0-9 Units Subcutaneous TID WC  . Melatonin  3 mg Oral QHS  . metoprolol succinate  25 mg Oral BID  . nicotine  21 mg Transdermal Daily  . simvastatin  10 mg Oral QHS  . warfarin  12.5 mg Oral ONCE-1800  . Warfarin - Pharmacist Dosing Inpatient   Does not apply q1800   Continuous Infusions: . sodium chloride 50 mL/hr at 07/23/17 0905     LOS: 2 days    Time spent: 25 minutes   Edwin Dada, MD [facility] 07/23/2017, 5:10 PM     Pager [phone] --- please page though AMION:  [email or web address] If 7PM-7AM, please contact night-coverage

## 2017-07-23 NOTE — Progress Notes (Signed)
ANTICOAGULATION CONSULT NOTE - Initial Consult  Pharmacy Consult for Warfarin Indication: atrial fibrillation  No Known Allergies  Patient Measurements:    Vital Signs: Temp: 98.4 F (36.9 C) (06/08 0600) Temp Source: Oral (06/08 0600) BP: 120/56 (06/08 0600) Pulse Rate: 97 (06/08 0600)  Labs: Recent Labs    07/21/17 2121 07/22/17 0831 07/23/17 0502  HGB  --  12.1* 11.3*  HCT  --  38.5* 35.7*  PLT  --  95* 132*  APTT 43*  --   --   LABPROT 27.4* 23.9* 20.5*  INR 2.58 2.15 1.77  CREATININE  --  0.83 0.89    CrCl cannot be calculated (Unknown ideal weight.).   Medical History: Past Medical History:  Diagnosis Date  . Arthritis   . COPD (chronic obstructive pulmonary disease) ([place])   . Depression   . Diabetes mellitus without complication ([place])   . Dysrhythmia    A flutter  . Hypertension   . Prostate cancer ([place])     Medications:  Medications Prior to Admission  Medication Sig Dispense Refill Last Dose  . albuterol (PROVENTIL HFA;VENTOLIN HFA) 108 (90 Base) MCG/ACT inhaler Inhale 1-2 puffs into the lungs every 6 (six) hours as needed for wheezing or shortness of breath.   unk at prn  . DULoxetine (CYMBALTA) 30 MG capsule Take 90 mg by mouth daily.   5 07/19/2017  . glipiZIDE (GLUCOTROL) 10 MG tablet Take 10 mg by mouth 2 (two) times daily.   07/19/2017  . lisinopril-hydrochlorothiazide (PRINZIDE,ZESTORETIC) 10-12.5 MG tablet Take 0.5 tablets by mouth daily.   12 07/19/2017  . Melatonin 3 MG TABS Take 3 mg by mouth at bedtime.   3 07/19/2017  . metFORMIN (GLUCOPHAGE-XR) 500 MG 24 hr tablet Take 500 mg by mouth 2 (two) times daily.   3 07/19/2017  . metoprolol succinate (TOPROL-XL) 25 MG 24 hr tablet Take 25 mg by mouth 2 (two) times daily.   3 07/19/2017  . morphine (MSIR) 15 MG tablet Take 15 mg by mouth every 6 (six) hours as needed for pain.  0 07/19/2017  . oxybutynin (DITROPAN) 5 MG tablet Take 5 mg by mouth 3 (three) times daily.    07/19/2017  . simvastatin (ZOCOR) 10  MG tablet Take 10 mg by mouth at bedtime.  12 07/19/2017  . traZODone (DESYREL) 100 MG tablet Take 50 mg by mouth at bedtime.   3 07/19/2017  . warfarin (COUMADIN) 10 MG tablet TAKE ONE TABLET BY MOUTH IN THE EVENING (BEDTIME)   07/19/2017  . warfarin (COUMADIN) 2.5 MG tablet TAKE ONE TABLET BY MOUTH IN THE EVENING (BEDTIME)   07/19/2017  . Meth-Hyo-M Bl-Na Phos-Ph Sal (URIBEL) 118 MG CAPS Take 1 capsule (118 mg total) by mouth every 8 (eight) hours as needed. (Patient not taking: Reported on 07/22/2017) 20 capsule 0 Not Taking at Unknown time    Assessment: 57 year old male on warfarin prior to admission for atrial fibrillation. INR was therapeutic on admission at 2.58. Warfarin was held 6/6 and 6/7 secondary to partial SBO and potential for surgery. Partial SBO is resolving and patient passing bowel movements. Pharmacy consulted to resume warfarin.   Patient's current INR is down to 1.77. No reversal agents received. Hgb is  Stable. Platelets are up from 95 to 132. No bleeding is noted.   Home regimen was 12.5 mg po daily - last dose per patient report was on 6/4.   Goal of Therapy:  INR 2-3 Monitor platelets by anticoagulation protocol:  Yes   Plan:  Warfarin 12.5mg  po x1 tonight Daily PT/INR Watch Platelets  Sloan Leiter, PharmD, BCPS, BCCCP Clinical Pharmacist Clinical phone 07/23/2017 until 3:30PM - [phone] After hours, please call [phone] 07/23/2017,2:06 PM

## 2017-07-24 LAB — CBC
HEMATOCRIT: 36.6 % — AB (ref 39.0–52.0)
HEMOGLOBIN: 11.8 g/dL — AB (ref 13.0–17.0)
MCH: 28.2 pg (ref 26.0–34.0)
MCHC: 32.2 g/dL (ref 30.0–36.0)
MCV: 87.6 fL (ref 78.0–100.0)
Platelets: 134 10*3/uL — ABNORMAL LOW (ref 150–400)
RBC: 4.18 MIL/uL — ABNORMAL LOW (ref 4.22–5.81)
RDW: 14.1 % (ref 11.5–15.5)
WBC: 6.1 10*3/uL (ref 4.0–10.5)

## 2017-07-24 LAB — BASIC METABOLIC PANEL
Anion gap: 6 (ref 5–15)
BUN: 9 mg/dL (ref 6–20)
CHLORIDE: 102 mmol/L (ref 101–111)
CO2: 32 mmol/L (ref 22–32)
CREATININE: 0.82 mg/dL (ref 0.61–1.24)
Calcium: 8.6 mg/dL — ABNORMAL LOW (ref 8.9–10.3)
GFR calc non Af Amer: >60 mL/min (ref >60)
Glucose, Bld: 139 mg/dL — ABNORMAL HIGH (ref 65–99)
Potassium: 3.9 mmol/L (ref 3.5–5.1)
Sodium: 140 mmol/L (ref 135–145)

## 2017-07-24 LAB — URINE CULTURE: Culture: NO GROWTH

## 2017-07-24 LAB — PROTIME-INR
INR: 1.55
Prothrombin Time: 18.5 seconds — ABNORMAL HIGH (ref 11.4–15.2)

## 2017-07-24 LAB — GLUCOSE, CAPILLARY
GLUCOSE-CAPILLARY: 162 mg/dL — AB (ref 65–99)
Glucose-Capillary: 113 mg/dL — ABNORMAL HIGH (ref 65–99)

## 2017-07-24 MED ORDER — WARFARIN SODIUM 2.5 MG PO TABS
12.5000 mg | ORAL_TABLET | Freq: Once | ORAL | Status: DC
  Filled 2017-07-24: qty 1

## 2017-07-24 NOTE — Progress Notes (Signed)
   Subjective/Chief Complaint: bms yesterday , tol regular diet, having flatus, no abd pain   Objective: Vital signs in last 24 hours: Temp:  [98.4 F (36.9 C)-98.7 F (37.1 C)] 98.4 F (36.9 C) (06/09 0606) Pulse Rate:  [76-80] 79 (06/09 0606) Resp:  [19] 19 (06/09 0606) BP: (148-171)/(86-96) 171/95 (06/09 0606) SpO2:  [93 %-97 %] 93 % (06/09 0606) Last BM Date: 07/23/17  Intake/Output from previous day: 06/08 0701 - 06/09 0700 In: 120 [P.O.:120] Out: 350 [Urine:200; Emesis/NG output:150] Intake/Output this shift: No intake/output data recorded.  GI: Obese, soft and nondistended.  Nontender.  Incisional hernia completely reducible and nontender.   Lab Results:  Recent Labs    07/23/17 0502 07/24/17 0735  WBC 5.1 6.1  HGB 11.3* 11.8*  HCT 35.7* 36.6*  PLT 132* 134*   BMET Recent Labs    07/23/17 0502 07/24/17 0735  NA 142 140  K 3.5 3.9  CL 106 102  CO2 27 32  GLUCOSE 109* 139*  BUN 12 9  CREATININE 0.89 0.82  CALCIUM 8.3* 8.6*   PT/INR Recent Labs    07/23/17 0502 07/24/17 0735  LABPROT 20.5* 18.5*  INR 1.77 1.55   ABG No results for input(s): PHART, HCO3 in the last 72 hours.  Invalid input(s): PCO2, PO2  Studies/Results: Dg Abd 1 View  Result Date: 07/23/2017 CLINICAL DATA:  Small-bowel obstruction EXAM: ABDOMEN - 1 VIEW COMPARISON:  07/22/2017 FINDINGS: NG tube within the stomach. Heart is enlarged. Minor basilar atelectasis. No large effusion. Persistent gaseous distention of small bowel in the right abdomen. There is contrast throughout the nondilated colon. Little interval change compared to 07/22/2017. IMPRESSION: Similar dilated small bowel in the right abdomen with contrast throughout the colon, more compatible with ileus than obstruction. Electronically Signed   By: Jerilynn Mages.  Shick M.D.   On: 07/23/2017 09:51    Anti-infectives: Anti-infectives (From admission, onward)   Start     Dose/Rate Route Frequency Ordered Stop   07/22/17 0015   piperacillin-tazobactam (ZOSYN) IVPB 3.375 g  Status:  Discontinued     3.375 g 12.5 mL/hr over 240 Minutes Intravenous Every 8 hours 07/22/17 0007 07/22/17 1617      Assessment/Plan: SBO ? Related to hernia vs adhesions This has resolved and tolerating regular diet. Can dc home today Due to size I would not electively repair these hernias at this time  Rolm Bookbinder 07/24/2017

## 2017-07-24 NOTE — Discharge Summary (Signed)
Physician Discharge Summary  Dabid Godown AST:[id number] DOB: 01-Aug-1960 DOA: 07/21/2017  PCP: Karsten Ro, DO  Admit date: 07/21/2017 Discharge date: 07/24/2017  Admitted From: Home  Disposition:  Home   Recommendations for Outpatient Follow-up:  1. Follow up with PCP in 1-2 weeks  Home Health: None  Equipment/Devices: None  Discharge Condition: Good  CODE STATUS: FULL Diet recommendation: Regular  Brief/Interim Summary: Mr. Inclan is a 57 y.o. M with HTN, DM, Afib on warfarin, obesity, ventral hernia repair and recurrent SBO who presents with abdominal pain, vomiting, found on CT to have SBO.    Small bowel obstruction NG tubed placed.  Started gastrografin protocol.  Had prompt recovery and BM on second day.  NG tube removed and tolerated solid diet.  General surgery indicated that he was not a good candidate for elective repair of hernia.  Acute metabolic encephalopathy Patient had sundowning twice.  During day he was quite alert.  He is likely at risk for premature dementia.  Persistent atrial fibrillation Chads 2 vas 2.  On warfarin.   -Warfarin held on admission for possible surgery, repeat started before discharge -Will need repeat INR within 1 week  Hypertension Well-controlled  Diabetes Well-controlled     Discharge Diagnoses:  Principal Problem:   SBO (small bowel obstruction) ([place]) Active Problems:   COPD (chronic obstructive pulmonary disease) (H[facility])   Depression   Diabetes mellitus without complication (H[facility])   Hypertension   Tobacco abuse   Acute metabolic encephalopathy    Discharge Instructions  Discharge Instructions    Diet - low sodium heart healthy   Complete by:  As directed    Discharge instructions   Complete by:  As directed    From Dr. Loleta Books: You were admitted with a small bowel obstruction.   Your stomach was decompressed with an NG tube (nasogastric tube).  The small bowel obstruction resolved.  As we talked about, likely  your morphine *does* contribute a little to the likelihood that you get bowel obstructions, but I am not sure.  It would be reasonable at any case to try to reduce your morphine use.  Follow up with your primary care physician as needed.   Increase activity slowly   Complete by:  As directed      Allergies as of 07/24/2017   No Known Allergies     Medication List    TAKE these medications   albuterol 108 (90 Base) MCG/ACT inhaler Commonly known as:  PROVENTIL HFA;VENTOLIN HFA Inhale 1-2 puffs into the lungs every 6 (six) hours as needed for wheezing or shortness of breath.   DULoxetine 30 MG capsule Commonly known as:  CYMBALTA Take 90 mg by mouth daily.   glipiZIDE 10 MG tablet Commonly known as:  GLUCOTROL Take 10 mg by mouth 2 (two) times daily.   lisinopril-hydrochlorothiazide 10-12.5 MG tablet Commonly known as:  PRINZIDE,ZESTORETIC Take 0.5 tablets by mouth daily.   Melatonin 3 MG Tabs Take 3 mg by mouth at bedtime.   metFORMIN 500 MG 24 hr tablet Commonly known as:  GLUCOPHAGE-XR Take 500 mg by mouth 2 (two) times daily.   metoprolol succinate 25 MG 24 hr tablet Commonly known as:  TOPROL-XL Take 25 mg by mouth 2 (two) times daily.   morphine 15 MG tablet Commonly known as:  MSIR Take 15 mg by mouth every 6 (six) hours as needed for pain.   oxybutynin 5 MG tablet Commonly known as:  DITROPAN Take 5 mg by mouth 3 (three) times  daily.   simvastatin 10 MG tablet Commonly known as:  ZOCOR Take 10 mg by mouth at bedtime.   traZODone 100 MG tablet Commonly known as:  DESYREL Take 50 mg by mouth at bedtime.   URIBEL 118 MG Caps Take 1 capsule (118 mg total) by mouth every 8 (eight) hours as needed.   warfarin 10 MG tablet Commonly known as:  COUMADIN TAKE ONE TABLET BY MOUTH IN THE EVENING (BEDTIME)   warfarin 2.5 MG tablet Commonly known as:  COUMADIN TAKE ONE TABLET BY MOUTH IN THE EVENING (BEDTIME)       No Known  Allergies  Consultations:  General Surgery   Procedures/Studies: Dg Abd 1 View  Result Date: 07/23/2017 CLINICAL DATA:  Small-bowel obstruction EXAM: ABDOMEN - 1 VIEW COMPARISON:  07/22/2017 FINDINGS: NG tube within the stomach. Heart is enlarged. Minor basilar atelectasis. No large effusion. Persistent gaseous distention of small bowel in the right abdomen. There is contrast throughout the nondilated colon. Little interval change compared to 07/22/2017. IMPRESSION: Similar dilated small bowel in the right abdomen with contrast throughout the colon, more compatible with ileus than obstruction. Electronically Signed   By: Jerilynn Mages.  Shick M.D.   On: 07/23/2017 09:51   Ct Head Wo Contrast  Result Date: 07/21/2017 CLINICAL DATA:  Altered mental status EXAM: CT HEAD WITHOUT CONTRAST TECHNIQUE: Contiguous axial images were obtained from the base of the skull through the vertex without intravenous contrast. COMPARISON:  None. FINDINGS: Brain: There is no mass, hemorrhage or extra-axial collection. The size and configuration of the ventricles and extra-axial CSF spaces are normal. There is no acute or chronic infarction. The brain parenchyma is normal. Vascular: No abnormal hyperdensity of the major intracranial arteries or dural venous sinuses. No intracranial atherosclerosis. Skull: The visualized skull base, calvarium and extracranial soft tissues are normal. Sinuses/Orbits: No fluid levels or advanced mucosal thickening of the visualized paranasal sinuses. No mastoid or middle ear effusion. The orbits are normal. IMPRESSION: Normal head CT. Electronically Signed   By: Ulyses Jarred M.D.   On: 07/21/2017 23:13   Dg Abd Portable 1v-small Bowel Obstruction Protocol-initial, 8 Hr Delay  Result Date: 07/22/2017 CLINICAL DATA:  Small bowel obstruction. EXAM: PORTABLE ABDOMEN - 1 VIEW COMPARISON:  One-view abdomen 07/21/2017. FINDINGS: Dilated loops of small bowel are present in the right-sided the abdomen. The  stomach is decompressed. The side port of the NG tube is in the fundus the stomach. Contrast in the seen within the transverse colon. IMPRESSION: 1. Dilated loops of small bowel in the right-sided the abdomen with contrast in the colon. This suggests adynamic ileus without obstruction. Electronically Signed   By: San Morelle M.D.   On: 07/22/2017 09:10   Dg Abd Portable 1v-small Bowel Protocol-position Verification  Result Date: 07/21/2017 CLINICAL DATA:  NG tube placement. EXAM: PORTABLE ABDOMEN - 1 VIEW COMPARISON:  None. FINDINGS: Enteric tube in place with the tip near the gastric antrum. Paucity of bowel gas. IMPRESSION: Enteric tube within the stomach. Electronically Signed   By: Titus Dubin M.D.   On: 07/21/2017 23:50       Subjective: Feeling well.  No abdominal pain no vomiting, no bloating, distention, no confusion, no memory of sundowning, no diarrhea, no hematochezia.  Discharge Exam: Vitals:   07/24/17 0606 07/24/17 0957  BP: (!) 171/95 (!) 144/81  Pulse: 79 81  Resp: 19   Temp: 98.4 F (36.9 C)   SpO2: 93% 99%   Vitals:   07/23/17 1412 07/23/17 2210 07/24/17  0606 07/24/17 0957  BP: (!) 148/86 (!) 160/96 (!) 171/95 (!) 144/81  Pulse: 80 76 79 81  Resp:  19 19   Temp: 98.6 F (37 C) 98.7 F (37.1 C) 98.4 F (36.9 C)   TempSrc: Oral Oral Oral   SpO2: 97% 96% 93% 99%    General: Pt is alert, awake, not in acute distress Cardiovascular: RRR, S1/S2 +, no rubs, no gallops Respiratory: CTA bilaterally, no wheezing, no rhonchi Abdominal: Soft, large ventral hernia, NT, ND, bowel sounds + Extremities: no edema, no cyanosis    The results of significant diagnostics from this hospitalization (including imaging, microbiology, ancillary and laboratory) are listed below for reference.     Microbiology: Recent Results (from the past 240 hour(s))  Culture, blood (Routine X 2) w Reflex to ID Panel     Status: None (Preliminary result)   Collection Time:  07/21/17  9:15 PM  Result Value Ref Range Status   Specimen Description BLOOD RIGHT ANTECUBITAL  Final   Special Requests   Final    BOTTLES DRAWN AEROBIC ONLY Blood Culture adequate volume   Culture   Final    NO GROWTH 3 DAYS Performed at [facility], [address] [place], [place] [address]    Report Status PENDING  Incomplete  Culture, blood (Routine X 2) w Reflex to ID Panel     Status: None (Preliminary result)   Collection Time: 07/21/17  9:20 PM  Result Value Ref Range Status   Specimen Description BLOOD RIGHT ANTECUBITAL  Final   Special Requests   Final    BOTTLES DRAWN AEROBIC ONLY Blood Culture adequate volume   Culture   Final    NO GROWTH 3 DAYS Performed at [facility], [place] [address] [place], [place] [address]    Report Status PENDING  Incomplete  Urine Culture     Status: None   Collection Time: 07/21/17 11:00 PM  Result Value Ref Range Status   Specimen Description URINE, RANDOM  Final   Special Requests NONE  Final   Culture   Final    NO GROWTH Performed at [facility], [place] [address] [place], [place] [address]    Report Status 07/24/2017 FINAL  Final     Labs: BNP (last 3 results) No results for input(s): BNP in the last 8760 hours. Basic Metabolic Panel: Recent Labs  Lab 07/22/17 0831 07/23/17 0502 07/24/17 0735  NA 140 142 140  K 4.2 3.5 3.9  CL 107 106 102  CO2 27 27 32  GLUCOSE 122* 109* 139*  BUN 11 12 9   CREATININE 0.83 0.89 0.82  CALCIUM 7.9* 8.3* 8.6*   Liver Function Tests: No results for input(s): AST, ALT, ALKPHOS, BILITOT, PROT, ALBUMIN in the last 168 hours. No results for input(s): LIPASE, AMYLASE in the last 168 hours. No results for input(s): AMMONIA in the last 168 hours. CBC: Recent Labs  Lab 07/22/17 0831 07/23/17 0502 07/24/17 0735  WBC 4.6 5.1 6.1  HGB 12.1* 11.3* 11.8*  HCT 38.5* 35.7* 36.6*  MCV 89.7 88.4 87.6  PLT 95* 132* 134*   Cardiac Enzymes: No results for input(s): CKTOTAL,  CKMB, CKMBINDEX, TROPONINI in the last 168 hours. BNP: Invalid input(s): POCBNP CBG: Recent Labs  Lab 07/23/17 1232 07/23/17 1707 07/23/17 2205 07/24/17 0815 07/24/17 1233  GLUCAP 181* 140* 185* 113* 162*   D-Dimer No results for input(s): DDIMER in the last 72 hours. Hgb A1c No results for input(s): HGBA1C  in the last 72 hours. Lipid Profile No results for input(s): CHOL, HDL, LDLCALC, TRIG, CHOLHDL, LDLDIRECT in the last 72 hours. Thyroid function studies No results for input(s): TSH, T4TOTAL, T3FREE, THYROIDAB in the last 72 hours.  Invalid input(s): FREET3 Anemia work up No results for input(s): VITAMINB12, FOLATE, FERRITIN, TIBC, IRON, RETICCTPCT in the last 72 hours. Urinalysis    Component Value Date/Time   COLORURINE YELLOW 07/21/2017 2230   APPEARANCEUR HAZY (A) 07/21/2017 2230   LABSPEC 1.031 (H) 07/21/2017 2230   PHURINE 5.0 07/21/2017 2230   GLUCOSEU NEGATIVE 07/21/2017 2230   HGBUR NEGATIVE 07/21/2017 2230   BILIRUBINUR NEGATIVE 07/21/2017 2230   KETONESUR 5 (A) 07/21/2017 2230   PROTEINUR NEGATIVE 07/21/2017 2230   NITRITE NEGATIVE 07/21/2017 2230   LEUKOCYTESUR NEGATIVE 07/21/2017 2230   Sepsis Labs Invalid input(s): PROCALCITONIN,  WBC,  LACTICIDVEN Microbiology Recent Results (from the past 240 hour(s))  Culture, blood (Routine X 2) w Reflex to ID Panel     Status: None (Preliminary result)   Collection Time: 07/21/17  9:15 PM  Result Value Ref Range Status   Specimen Description BLOOD RIGHT ANTECUBITAL  Final   Special Requests   Final    BOTTLES DRAWN AEROBIC ONLY Blood Culture adequate volume   Culture   Final    NO GROWTH 3 DAYS Performed at [facility], [place] [address] [place], [place] [address]    Report Status PENDING  Incomplete  Culture, blood (Routine X 2) w Reflex to ID Panel     Status: None (Preliminary result)   Collection Time: 07/21/17  9:20 PM  Result Value Ref Range Status   Specimen Description BLOOD RIGHT  ANTECUBITAL  Final   Special Requests   Final    BOTTLES DRAWN AEROBIC ONLY Blood Culture adequate volume   Culture   Final    NO GROWTH 3 DAYS Performed at [facility], [place] [address], [place], [place] [address]    Report Status PENDING  Incomplete  Urine Culture     Status: None   Collection Time: 07/21/17 11:00 PM  Result Value Ref Range Status   Specimen Description URINE, RANDOM  Final   Special Requests NONE  Final   Culture   Final    NO GROWTH Performed at [facility], [place] [address] [place], [place] [address]    Report Status 07/24/2017 FINAL  Final     Time coordinating discharge: 20 minutes       SIGNED:   Edwin Dada, MD  [facility] 07/24/2017, 4:08 PM

## 2017-07-24 NOTE — Progress Notes (Signed)
ANTICOAGULATION CONSULT NOTE - Follow Up Consult  Pharmacy Consult for Warfarin Indication: atrial fibrillation  No Known Allergies  Patient Measurements:     Vital Signs: Temp: 98.4 F (36.9 C) (06/09 0606) Temp Source: Oral (06/09 0606) BP: 171/95 (06/09 0606) Pulse Rate: 79 (06/09 0606)  Labs: Recent Labs    07/21/17 2121  07/22/17 0831 07/23/17 0502 07/24/17 0735  HGB  --    < > 12.1* 11.3* 11.8*  HCT  --   --  38.5* 35.7* 36.6*  PLT  --   --  95* 132* 134*  APTT 43*  --   --   --   --   LABPROT 27.4*  --  23.9* 20.5* 18.5*  INR 2.58  --  2.15 1.77 1.55  CREATININE  --   --  0.83 0.89 0.82   < > = values in this interval not displayed.    CrCl cannot be calculated (Unknown ideal weight.).   Assessment: 57 year old male on warfarin prior to admission for atrial fibrillation. INR was therapeutic on admission at 2.58. Warfarin was held 6/6 and 6/7 secondary to partial SBO and potential for surgery. Partial SBO is resolving and patient passing bowel movements. Warfarin resumed 6/8.   INR today down to 1.55. Dose of 12.5mg  warfarin received yesterday PM. H/H is stable. Platelets are improving. No bleeding noted.     Goal of Therapy:  INR 2-3 Monitor platelets by anticoagulation protocol: Yes   Plan:  Warfarin 12.5mg  po x1 tonight (per home dose) Daily PT/INR while inpatient If discharges today, recommend home on home dose of 12.5mg  daily with INR check next week.   Sloan Leiter, PharmD, BCPS, BCCCP Clinical Pharmacist Clinical phone 07/24/2017 until 3:[phone] After hours, please call #[phone] 07/24/2017,9:45 AM

## 2017-07-24 NOTE — Discharge Instructions (Signed)

## 2017-07-26 LAB — CULTURE, BLOOD (ROUTINE X 2)
Culture: NO GROWTH
Culture: NO GROWTH
SPECIAL REQUESTS: ADEQUATE
Special Requests: ADEQUATE

## 2017-09-15 DIAGNOSIS — G4733 Obstructive sleep apnea (adult) (pediatric): Secondary | ICD-10-CM | POA: Diagnosis not present

## 2017-09-15 DIAGNOSIS — M25512 Pain in left shoulder: Secondary | ICD-10-CM | POA: Diagnosis not present

## 2017-09-15 DIAGNOSIS — M79629 Pain in unspecified upper arm: Secondary | ICD-10-CM | POA: Diagnosis not present

## 2017-09-19 DIAGNOSIS — C61 Malignant neoplasm of prostate: Secondary | ICD-10-CM | POA: Diagnosis not present

## 2017-10-01 ENCOUNTER — Other Ambulatory Visit: Payer: Self-pay

## 2017-10-01 ENCOUNTER — Emergency Department (HOSPITAL_COMMUNITY): Payer: Medicare Other

## 2017-10-01 ENCOUNTER — Encounter (HOSPITAL_COMMUNITY): Payer: Self-pay | Admitting: Emergency Medicine

## 2017-10-01 ENCOUNTER — Inpatient Hospital Stay (HOSPITAL_COMMUNITY)
Admission: EM | Admit: 2017-10-01 | Discharge: 2017-10-02 | DRG: 389 | Discharge status: Against Medical Advice | Payer: Medicare Other | Attending: Internal Medicine | Admitting: Internal Medicine

## 2017-10-01 DIAGNOSIS — Z7901 Long term (current) use of anticoagulants: Secondary | ICD-10-CM | POA: Diagnosis not present

## 2017-10-01 DIAGNOSIS — I10 Essential (primary) hypertension: Secondary | ICD-10-CM

## 2017-10-01 DIAGNOSIS — K56609 Unspecified intestinal obstruction, unspecified as to partial versus complete obstruction: Secondary | ICD-10-CM | POA: Diagnosis not present

## 2017-10-01 DIAGNOSIS — E669 Obesity, unspecified: Secondary | ICD-10-CM

## 2017-10-01 DIAGNOSIS — R9431 Abnormal electrocardiogram [ECG] [EKG]: Secondary | ICD-10-CM | POA: Diagnosis not present

## 2017-10-01 DIAGNOSIS — E119 Type 2 diabetes mellitus without complications: Secondary | ICD-10-CM

## 2017-10-01 DIAGNOSIS — Z4659 Encounter for fitting and adjustment of other gastrointestinal appliance and device: Secondary | ICD-10-CM | POA: Insufficient documentation

## 2017-10-01 DIAGNOSIS — C61 Malignant neoplasm of prostate: Secondary | ICD-10-CM | POA: Diagnosis present

## 2017-10-01 DIAGNOSIS — Z720 Tobacco use: Secondary | ICD-10-CM | POA: Diagnosis not present

## 2017-10-01 DIAGNOSIS — Z79890 Hormone replacement therapy (postmenopausal): Secondary | ICD-10-CM | POA: Diagnosis not present

## 2017-10-01 DIAGNOSIS — J449 Chronic obstructive pulmonary disease, unspecified: Secondary | ICD-10-CM | POA: Diagnosis not present

## 2017-10-01 DIAGNOSIS — I4581 Long QT syndrome: Secondary | ICD-10-CM | POA: Diagnosis present

## 2017-10-01 DIAGNOSIS — F17210 Nicotine dependence, cigarettes, uncomplicated: Secondary | ICD-10-CM | POA: Diagnosis present

## 2017-10-01 DIAGNOSIS — Z79899 Other long term (current) drug therapy: Secondary | ICD-10-CM

## 2017-10-01 DIAGNOSIS — W57XXXA Bitten or stung by nonvenomous insect and other nonvenomous arthropods, initial encounter: Secondary | ICD-10-CM | POA: Diagnosis present

## 2017-10-01 DIAGNOSIS — B888 Other specified infestations: Secondary | ICD-10-CM | POA: Diagnosis not present

## 2017-10-01 DIAGNOSIS — I482 Chronic atrial fibrillation, unspecified: Secondary | ICD-10-CM | POA: Diagnosis present

## 2017-10-01 DIAGNOSIS — R109 Unspecified abdominal pain: Secondary | ICD-10-CM | POA: Diagnosis not present

## 2017-10-01 DIAGNOSIS — Z4682 Encounter for fitting and adjustment of non-vascular catheter: Secondary | ICD-10-CM | POA: Diagnosis not present

## 2017-10-01 DIAGNOSIS — Z7984 Long term (current) use of oral hypoglycemic drugs: Secondary | ICD-10-CM

## 2017-10-01 DIAGNOSIS — I481 Persistent atrial fibrillation: Secondary | ICD-10-CM | POA: Diagnosis present

## 2017-10-01 DIAGNOSIS — K5651 Intestinal adhesions [bands], with partial obstruction: Secondary | ICD-10-CM | POA: Diagnosis not present

## 2017-10-01 DIAGNOSIS — Z6839 Body mass index (BMI) 39.0-39.9, adult: Secondary | ICD-10-CM | POA: Diagnosis not present

## 2017-10-01 DIAGNOSIS — R52 Pain, unspecified: Secondary | ICD-10-CM | POA: Diagnosis not present

## 2017-10-01 DIAGNOSIS — K565 Intestinal adhesions [bands], unspecified as to partial versus complete obstruction: Secondary | ICD-10-CM | POA: Diagnosis not present

## 2017-10-01 DIAGNOSIS — N320 Bladder-neck obstruction: Secondary | ICD-10-CM | POA: Diagnosis present

## 2017-10-01 DIAGNOSIS — K432 Incisional hernia without obstruction or gangrene: Secondary | ICD-10-CM | POA: Diagnosis not present

## 2017-10-01 DIAGNOSIS — R1084 Generalized abdominal pain: Secondary | ICD-10-CM | POA: Diagnosis not present

## 2017-10-01 HISTORY — DX: Unspecified intestinal obstruction, unspecified as to partial versus complete obstruction: K56.609

## 2017-10-01 LAB — COMPREHENSIVE METABOLIC PANEL
ALK PHOS: 95 U/L (ref 38–126)
ALT: 20 U/L (ref 0–44)
ANION GAP: 11 (ref 5–15)
AST: 16 U/L (ref 15–41)
Albumin: 3.7 g/dL (ref 3.5–5.0)
BILIRUBIN TOTAL: 0.6 mg/dL (ref 0.3–1.2)
BUN: 18 mg/dL (ref 6–20)
CALCIUM: 9.6 mg/dL (ref 8.9–10.3)
CO2: 28 mmol/L (ref 22–32)
Chloride: 98 mmol/L (ref 98–111)
Creatinine, Ser: 1.07 mg/dL (ref 0.61–1.24)
GFR calc Af Amer: >60 mL/min (ref >60)
GFR calc non Af Amer: >60 mL/min (ref >60)
Glucose, Bld: 174 mg/dL — ABNORMAL HIGH (ref 70–99)
Potassium: 4.4 mmol/L (ref 3.5–5.1)
SODIUM: 137 mmol/L (ref 135–145)
TOTAL PROTEIN: 7 g/dL (ref 6.5–8.1)

## 2017-10-01 LAB — PROTIME-INR
INR: 0.91
Prothrombin Time: 12.1 seconds (ref 11.4–15.2)

## 2017-10-01 LAB — MAGNESIUM: MAGNESIUM: 1.8 mg/dL (ref 1.7–2.4)

## 2017-10-01 LAB — GLUCOSE, CAPILLARY
Glucose-Capillary: 159 mg/dL — ABNORMAL HIGH (ref 70–99)
Glucose-Capillary: 136 mg/dL — ABNORMAL HIGH (ref 70–99)

## 2017-10-01 LAB — CBC
HCT: 45.8 % (ref 39.0–52.0)
HEMOGLOBIN: 15.3 g/dL (ref 13.0–17.0)
MCH: 28.9 pg (ref 26.0–34.0)
MCHC: 33.4 g/dL (ref 30.0–36.0)
MCV: 86.6 fL (ref 78.0–100.0)
Platelets: 171 10*3/uL (ref 150–400)
RBC: 5.29 MIL/uL (ref 4.22–5.81)
RDW: 14.8 % (ref 11.5–15.5)
WBC: 14.2 10*3/uL — AB (ref 4.0–10.5)

## 2017-10-01 LAB — LIPASE, BLOOD: Lipase: 21 U/L (ref 11–51)

## 2017-10-01 LAB — LACTIC ACID, PLASMA: LACTIC ACID, VENOUS: 1.3 mmol/L (ref 0.5–1.9)

## 2017-10-01 MED ORDER — ONDANSETRON HCL 4 MG PO TABS: 4.0000 mg | ORAL_TABLET | Freq: Four times a day (QID) | ORAL | Status: DC | PRN

## 2017-10-01 MED ORDER — MORPHINE SULFATE (PF) 4 MG/ML IV SOLN
4.0000 mg | Freq: Once | INTRAVENOUS | Status: AC
  Administered 2017-10-01: 4 mg via INTRAVENOUS
  Filled 2017-10-01: qty 1

## 2017-10-01 MED ORDER — ACETAMINOPHEN 325 MG PO TABS: 650.0000 mg | ORAL_TABLET | Freq: Four times a day (QID) | ORAL | Status: DC | PRN

## 2017-10-01 MED ORDER — HYDROMORPHONE HCL 1 MG/ML IJ SOLN
1.0000 mg | Freq: Once | INTRAMUSCULAR | Status: AC
  Administered 2017-10-01: 1 mg via INTRAVENOUS
  Filled 2017-10-01: qty 1

## 2017-10-01 MED ORDER — SODIUM CHLORIDE 0.9 % IV SOLN
INTRAVENOUS | Status: DC
  Administered 2017-10-01 – 2017-10-02 (×2): via INTRAVENOUS

## 2017-10-01 MED ORDER — ENOXAPARIN SODIUM 80 MG/0.8ML ~~LOC~~ SOLN
70.0000 mg | SUBCUTANEOUS | Status: DC
  Administered 2017-10-01: 70 mg via SUBCUTANEOUS
  Filled 2017-10-01: qty 0.8

## 2017-10-01 MED ORDER — HYDROMORPHONE HCL 1 MG/ML IJ SOLN
0.5000 mg | Freq: Once | INTRAMUSCULAR | Status: AC
  Administered 2017-10-01: 0.5 mg via INTRAVENOUS
  Filled 2017-10-01: qty 1

## 2017-10-01 MED ORDER — MORPHINE SULFATE (PF) 2 MG/ML IV SOLN
2.0000 mg | INTRAVENOUS | Status: DC | PRN
  Administered 2017-10-01 – 2017-10-02 (×5): 2 mg via INTRAVENOUS
  Filled 2017-10-01 (×5): qty 1

## 2017-10-01 MED ORDER — ONDANSETRON HCL 4 MG/2ML IJ SOLN
4.0000 mg | Freq: Once | INTRAMUSCULAR | Status: AC
Start: 2017-10-01 — End: 2017-10-01
  Administered 2017-10-01: 4 mg via INTRAVENOUS
  Filled 2017-10-01: qty 2

## 2017-10-01 MED ORDER — ONDANSETRON HCL 4 MG/2ML IJ SOLN: 4.0000 mg | Freq: Four times a day (QID) | INTRAMUSCULAR | Status: DC | PRN

## 2017-10-01 MED ORDER — METOCLOPRAMIDE HCL 5 MG/ML IJ SOLN
10.0000 mg | Freq: Once | INTRAMUSCULAR | Status: AC
  Administered 2017-10-01: 10 mg via INTRAVENOUS
  Filled 2017-10-01: qty 2

## 2017-10-01 MED ORDER — ACETAMINOPHEN 650 MG RE SUPP: 650.0000 mg | Freq: Four times a day (QID) | RECTAL | Status: DC | PRN

## 2017-10-01 MED ORDER — ENOXAPARIN SODIUM 40 MG/0.4ML ~~LOC~~ SOLN: 40.0000 mg | SUBCUTANEOUS | Status: DC

## 2017-10-01 NOTE — ED Notes (Signed)
Pt reports he feels "somewhat" better  Request lights dimmed

## 2017-10-01 NOTE — ED Notes (Signed)
Unable to perform CT at this time due to pt having bed bugs.  Order for portable acute abd obtained until further decision can be made.

## 2017-10-01 NOTE — ED Notes (Signed)
#   18 NG to R nares   Call for rad

## 2017-10-01 NOTE — ED Triage Notes (Signed)
Patient c/o generalized abd pain with nausea. Denies any diarrhea, fevers, or urinary. Patient reports bright red blood clots in stool. Hx of Bowel obstruction.

## 2017-10-01 NOTE — ED Notes (Signed)
[place], [place] notified of pt's pain.  No new orders at this time.  Pt made aware.

## 2017-10-01 NOTE — H&P (Signed)
TRH H&P   Patient Demographics:    Dale Franklin, is a 57 y.o. male  MRN: [id number]   DOB - [birth year]  Admit Date - 10/01/2017  Outpatient Primary MD for the patient is Karsten Ro, DO  Referring MD: Dr Thurnell Garbe  Outpatient Specialists: none  Patient coming from: Home  Chief Complaint  Patient presents with  . Abdominal Pain      HPI:    Dale Franklin  is a 57 y.o. male, with history of hypertension, diabetes mellitus, A. fib on Coumadin, obesity, ventral hernia repair with recurrent small bowel obstruction who was last hospitalized 2 months back with small bowel obstruction that was managed nonoperatively presented to the ED with generalized abdominal pain more prominent in the infraumbilical area around his hernia since yesterday.  Patient reports that he noticed some dark stool 2 weeks back and his urologist told him to be off Coumadin. Patient reports feeling hot but does not have fever or chills.  Reports feeling nauseated and had an episode of vomiting in the ED.  He denies any headache, blurred vision, dizziness, chest pain, shortness of breath, palpitations, dysuria, diarrhea or constipation.  Denies any weakness, tingling or numbness of his extremities.  Denies any recent illness or sick contact. In the ED he was tachycardic with distended abdomen and generalized tenderness with guarding.  Blood pressure was also soft.  Blood work showed WC of 14 K, normal lipase, glucose of 174 and normal chemistry. Given IV fluids and IV morphine ordered by IV Dilaudid.  X-ray of the abdomen showed nonspecific bowel gas pattern with distended small bowel loops suspicious of small bowel obstruction.  CT of the abdomen ordered but not as found a bedbug in the patient's room.  The CT suite had to be close down for deep cleaning since patient had bedbug infestation. NG tube placed in  the ED, IV fluids started and hospitalist consult for admission to medical floor.  Surgery consulted by ED physician.   Review of systems:    In addition to the HPI above,  No Fever-chills, No Headache, No changes with Vision or hearing, No problems swallowing food or Liquids, No Chest pain, Cough or Shortness of Breath, Abdominal pain +++, abdominal distention, nausea and vomiting, no diarrhea, No Blood in stool or Urine, No dysuria, No new skin rashes or bruises, No new joints pains-aches,  No new weakness, tingling, numbness in any extremity, No recent weight gain or loss, No polyuria, polydypsia or polyphagia, No significant Mental Stressors.     With Past History of the following :    Past Medical History:  Diagnosis Date  . Arthritis   . Bowel obstruction ([place])   . COPD (chronic obstructive pulmonary disease) ([place])   . Depression   . Diabetes mellitus without complication ([place])   . Dysrhythmia    A flutter  . Hypertension   .  Prostate cancer [facility])       Past Surgical History:  Procedure Laterality Date  . GOLD SEED IMPLANT N/A 05/31/2016   Procedure: GOLD SEED IMPLANT;  Surgeon: Franchot Gallo, MD;  Location: [facility];  Service: Urology;  Laterality: N/A;  . HAND SURGERY Right   . HERNIA REPAIR    . TRANSURETHRAL RESECTION OF PROSTATE N/A 05/31/2016   Procedure: TRANSURETHRAL RESECTION OF THE PROSTATE (TURP);  Surgeon: Franchot Gallo, MD;  Location: [facility];  Service: Urology;  Laterality: N/A;  . WRIST SURGERY Right       Social History:     Social History   Tobacco Use  . Smoking status: Current Every Day Smoker    Packs/day: 0.50    Types: Cigarettes  . Smokeless tobacco: Never Used  Substance Use Topics  . Alcohol use: Yes    Comment: occasionally     Lives -home  Mobility -independent     Family History :     Family History  Problem Relation Age of Onset  . Diabetes Mother   . Cancer Father   . Cancer Paternal Uncle         prostate      Home Medications:   Prior to Admission medications   Medication Sig Start Date End Date Taking? Authorizing Provider  albuterol (PROVENTIL HFA;VENTOLIN HFA) 108 (90 Base) MCG/ACT inhaler Inhale 1-2 puffs into the lungs every 6 (six) hours as needed for wheezing or shortness of breath.    [provider]  DULoxetine (CYMBALTA) 30 MG capsule Take 90 mg by mouth daily.     [provider]  glipiZIDE (GLUCOTROL) 10 MG tablet Take 10 mg by mouth 2 (two) times daily.    [provider]  lisinopril-hydrochlorothiazide (PRINZIDE,ZESTORETIC) 10-12.5 MG tablet Take 0.5 tablets by mouth daily.     [provider]  Melatonin 3 MG TABS Take 3 mg by mouth at bedtime.     [provider]  metFORMIN (GLUCOPHAGE-XR) 500 MG 24 hr tablet Take 500 mg by mouth 2 (two) times daily.     [provider]  Meth-Hyo-M Bl-Na Phos-Ph Sal (URIBEL) 118 MG CAPS Take 1 capsule (118 mg total) by mouth every 8 (eight) hours as needed. Patient not taking: Reported on 07/22/2017 05/31/16   Franchot Gallo, MD  metoprolol succinate (TOPROL-XL) 25 MG 24 hr tablet Take 25 mg by mouth 2 (two) times daily.     [provider]  morphine (MSIR) 15 MG tablet Take 15 mg by mouth every 6 (six) hours as needed for pain. 07/18/17   [provider]  oxybutynin (DITROPAN) 5 MG tablet Take 5 mg by mouth 3 (three) times daily.     [provider]  simvastatin (ZOCOR) 10 MG tablet Take 10 mg by mouth at bedtime. 06/30/17   [provider]  traZODone (DESYREL) 100 MG tablet Take 50 mg by mouth at bedtime.     [provider]  warfarin (COUMADIN) 10 MG tablet TAKE ONE TABLET BY MOUTH IN THE EVENING (BEDTIME) 07/05/16   [provider]  warfarin (COUMADIN) 2.5 MG tablet TAKE ONE TABLET BY MOUTH IN THE EVENING (BEDTIME) 07/05/16   [provider]     Allergies:    No Known Allergies   Physical Exam:   Vitals  Blood  pressure (!) 152/102, pulse 97, temperature 97.8 F (36.6 C), temperature source Oral, resp. rate 15, height 6\' 2"  (1.88 m), weight (!) 140.6 kg, SpO2 92 %.  Middle-aged obese male lying in bed in some discomfort HEENT: NG in place draining dark bilious output, pupils reactive bilaterally, EOMI, no pallor, no icterus, moist mucosa, supple neck Chest: Clear to auscultation bilaterally CVs: Normal S1 and S2, no murmurs rub or gallop GI: Soft, mild abdominal distention with infraumbilical hernia, tender to pressure and difficult to reduce, bowel sounds present Musculoskeletal : Warm, no edema, multiple ?bedbug bite marks over the chest abdomen and extremities. CNS: Alert and oriented, nonfocal   Data Review:    CBC Recent Labs  Lab 10/01/17 0907  WBC 14.2*  HGB 15.3  HCT 45.8  PLT 171  MCV 86.6  MCH 28.9  MCHC 33.4  RDW 14.8   ------------------------------------------------------------------------------------------------------------------  Chemistries  Recent Labs  Lab 10/01/17 0907  NA 137  K 4.4  CL 98  CO2 28  GLUCOSE 174*  BUN 18  CREATININE 1.07  CALCIUM 9.6  AST 16  ALT 20  ALKPHOS 95  BILITOT 0.6   ------------------------------------------------------------------------------------------------------------------ estimated creatinine clearance is 113.8 mL/min (by C-G formula based on SCr of 1.07 mg/dL). ------------------------------------------------------------------------------------------------------------------ No results for input(s): TSH, T4TOTAL, T3FREE, THYROIDAB in the last 72 hours.  Invalid input(s): FREET3  Coagulation profile Recent Labs  Lab 10/01/17 0845  INR 0.91   ------------------------------------------------------------------------------------------------------------------- No results for input(s): DDIMER in the last 72  hours. -------------------------------------------------------------------------------------------------------------------  Cardiac Enzymes No results for input(s): CKMB, TROPONINI, MYOGLOBIN in the last 168 hours.  Invalid input(s): CK ------------------------------------------------------------------------------------------------------------------ No results found for: BNP   ---------------------------------------------------------------------------------------------------------------  Urinalysis    Component Value Date/Time   COLORURINE YELLOW 07/21/2017 2230   APPEARANCEUR HAZY (A) 07/21/2017 2230   LABSPEC 1.031 (H) 07/21/2017 2230   PHURINE 5.0 07/21/2017 2230   GLUCOSEU NEGATIVE 07/21/2017 2230   HGBUR NEGATIVE 07/21/2017 2230   BILIRUBINUR NEGATIVE 07/21/2017 2230   KETONESUR 5 (A) 07/21/2017 2230   PROTEINUR NEGATIVE 07/21/2017 2230   NITRITE NEGATIVE 07/21/2017 2230   LEUKOCYTESUR NEGATIVE 07/21/2017 2230    ----------------------------------------------------------------------------------------------------------------   Imaging Results:    Dg Chest Port 1v Same Day  Result Date: 10/01/2017 CLINICAL DATA:  NG tube placement EXAM: PORTABLE CHEST 1 VIEW COMPARISON:  10/01/2017 and prior studies FINDINGS: An NG tube is noted with tip at the EG junction. Cardiomegaly and pulmonary vascular congestion again noted. No pneumothorax or definite pleural effusion. No other changes noted. IMPRESSION: NG tube with tip overlying the EG junction.  Recommend advancement. Cardiomegaly with pulmonary vascular congestion. Electronically Signed   By: Margarette Canada M.D.   On: 10/01/2017 12:02   Dg Abdomen Acute W/chest  Result Date: 10/01/2017 CLINICAL DATA:  Acute abdominal pain and vomiting. EXAM: DG ABDOMEN ACUTE W/ 1V CHEST COMPARISON:  07/21/2017 and prior radiographs FINDINGS: Cardiomegaly and pulmonary vascular congestion noted. No airspace disease, definite pleural effusion or  pneumothorax. A few distended small bowel loops are noted. A moderate to large amount of gas is noted in what appears to be the RIGHT colon. Gas in stomach is present. No definite pneumoperitoneum. IMPRESSION: 1. Nonspecific bowel gas pattern with few distended small bowel loops noted. Small-bowel obstruction is not excluded. No definite pneumoperitoneum. 2. Cardiomegaly with pulmonary vascular congestion. Electronically Signed   By: Margarette Canada M.D.   On: 10/01/2017 12:04    My personal review of EKG: Sinus tachycardia at 115, no ST-T changes, QTC of 600.   Assessment & Plan:    Active Problems: Small bowel obstruction due to adhesions [facility]) Monitor on telemetry.  NG to wall suction.  Serial  abdominal exam.  Monitor electrolytes (keep K >4 and mag >2).  Keep n.p.o..  Follow-up abdominal x-ray in a.m.  Pain control with PRN IV morphine.  Supportive care with antiemetics.  Follow-up with CT abdomen General surgery consulted and will follow up with recommendations.  Diabetes mellitus type 2 without complication ([place]) Hold home oral medications and monitor on sliding scale coverage every 4 hours.  Persistent atrial fibrillation On chronic Coumadin which is held as patient n.p.o.  Parent patient reports stopping Coumadin for past 2 weeks after being told by his urologist due to?  Blood in stool) .place on PRN IV Lopressor.  Monitor on telemetry.  Check repeat EKG in a.m.  Prolonged QTC Avoid QT prolonging agents.  Check magnesium.  Monitor on telemetry.  Keep K >4 and mag  Essential hypertension Home blood pressure medications held.  Placed on PRN IV hydralazine.  Bedbug infestation Will order topical steroid ointment and PRN Atarax for itching.  Care management consult to evaluate home situation.  DVT Prophylaxis: lovenox  AM Labs Ordered, also please review Full Orders  Family Communication: Admission, patients condition and plan of care including tests being ordered have been discussed  with the patient at bedside  Code Status full code  Likely DC to home  Condition: Fair  Consults called: Surgery  Admission status: Inpatient  Time spent in minutes : Surgery   Louellen Molder M.D on 10/01/2017 at 2:17 PM  Between 7am to 7pm - Pager - [phone]. After 7pm go to [email or web address] - password [facility]  [facility] - Office  [phone]

## 2017-10-01 NOTE — ED Notes (Signed)
Awaiting rad read for NG

## 2017-10-01 NOTE — ED Notes (Signed)
Evidence of bed bugs.  Live bug captured in cup.  Door shut to room.

## 2017-10-01 NOTE — ED Notes (Signed)
Pt states morphine has not helped at all and wants something else for pain.  Advised not enough time has elapsed since prior dose.  Given wet washcloth as requested.

## 2017-10-01 NOTE — ED Notes (Signed)
Pt w hx of bowel obstructrion  Prev hernia repair with mesh  Vomiting up foul smelling vomit on floor while shouting   abd tight distended and pt unable to go to CT due to bed bug found on pt  Pt reports hx of NG tube, but states "I am refusing it"- reality therapy to pt that he increases his risk of death of blown bowel as NG releases pressure above the obstruction

## 2017-10-01 NOTE — ED Notes (Signed)
Call for report Otila Kluver, RN unavailable  Call to CN not answered

## 2017-10-01 NOTE — ED Notes (Signed)
NG tube advanced and secured   HB in to assess

## 2017-10-01 NOTE — ED Provider Notes (Signed)
[facility] EMERGENCY DEPARTMENT Provider Note   CSN: [id number] Arrival date & time: 10/01/17  5573     History   Chief Complaint Chief Complaint  Patient presents with  . Abdominal Pain    HPI Dale Franklin is a 57 y.o. male.  Patient is a 57 year old male who presents to the emergency department with a complaint of abdominal pain.  The patient presents to the emergency department by EMS because of generalized abdominal pain.  The patient states that 2 weeks ago he noticed some blood with some clots in his stool.  On last night he began to have severe pain in his abdomen.  The patient states that he has had multiple abdominal surgeries.  He has had an admission in June of this year because of a small bowel obstruction.  The patient is not having any vomiting, but says he is constantly nauseated.  There is been no diarrhea or constipation recently.  The patient says he has felt hot, but has not measured a temperature elevation.  There is been no recent injury reported to the abdomen, and no recent operations or procedures.  The patient states he has multiple hernias, and he says this is the area that is hurting him now.  He has tried to find a comfortable position to lie in, and says this is extremely difficult.  The history is provided by the patient.  Abdominal Pain   Pertinent negatives include fever, diarrhea, nausea, vomiting, constipation, dysuria, frequency, hematuria and arthralgias.    Past Medical History:  Diagnosis Date  . Arthritis   . Bowel obstruction ([place])   . COPD (chronic obstructive pulmonary disease) ([place])   . Depression   . Diabetes mellitus without complication ([place])   . Dysrhythmia    A flutter  . Hypertension   . Prostate cancer [facility])     Patient Active Problem List   Diagnosis Date Noted  . SBO (small bowel obstruction) ([place]) 07/21/2017  . Tobacco abuse 07/21/2017  . Acute metabolic encephalopathy 22/03/5425  . COPD (chronic obstructive  pulmonary disease) ([place])   . Depression   . Diabetes mellitus without complication ([place])   . Hypertension   . Bladder outlet obstruction 06/01/2016  . Malignant neoplasm of prostate ([place]) 05/31/2016  . Prostate cancer ([place]) 05/11/2016    Past Surgical History:  Procedure Laterality Date  . GOLD SEED IMPLANT N/A 05/31/2016   Procedure: GOLD SEED IMPLANT;  Surgeon: Franchot Gallo, MD;  Location: [facility] ORS;  Service: Urology;  Laterality: N/A;  . HAND SURGERY Right   . HERNIA REPAIR    . TRANSURETHRAL RESECTION OF PROSTATE N/A 05/31/2016   Procedure: TRANSURETHRAL RESECTION OF THE PROSTATE (TURP);  Surgeon: Franchot Gallo, MD;  Location: [facility];  Service: Urology;  Laterality: N/A;  . WRIST SURGERY Right         Home Medications    Prior to Admission medications   Medication Sig Start Date End Date Taking? Authorizing Provider  albuterol (PROVENTIL HFA;VENTOLIN HFA) 108 (90 Base) MCG/ACT inhaler Inhale 1-2 puffs into the lungs every 6 (six) hours as needed for wheezing or shortness of breath.    [provider]  DULoxetine (CYMBALTA) 30 MG capsule Take 90 mg by mouth daily.     [provider]  glipiZIDE (GLUCOTROL) 10 MG tablet Take 10 mg by mouth 2 (two) times daily.    [provider]  lisinopril-hydrochlorothiazide (PRINZIDE,ZESTORETIC) 10-12.5 MG tablet Take 0.5 tablets by mouth daily.     [provider]  Melatonin 3 MG TABS Take 3 mg by mouth at bedtime.     [provider]  metFORMIN (GLUCOPHAGE-XR) 500 MG 24 hr tablet Take 500 mg by mouth 2 (two) times daily.     [provider]  Meth-Hyo-M Bl-Na Phos-Ph Sal (URIBEL) 118 MG CAPS Take 1 capsule (118 mg total) by mouth every 8 (eight) hours as needed. Patient not taking: Reported on 07/22/2017 05/31/16   Franchot Gallo, MD  metoprolol succinate (TOPROL-XL) 25 MG 24 hr tablet Take 25 mg by mouth 2 (two) times daily.     [provider]  morphine (MSIR) 15 MG  tablet Take 15 mg by mouth every 6 (six) hours as needed for pain. 07/18/17   [provider]  oxybutynin (DITROPAN) 5 MG tablet Take 5 mg by mouth 3 (three) times daily.     [provider]  simvastatin (ZOCOR) 10 MG tablet Take 10 mg by mouth at bedtime. 06/30/17   [provider]  traZODone (DESYREL) 100 MG tablet Take 50 mg by mouth at bedtime.     [provider]  warfarin (COUMADIN) 10 MG tablet TAKE ONE TABLET BY MOUTH IN THE EVENING (BEDTIME) 07/05/16   [provider]  warfarin (COUMADIN) 2.5 MG tablet TAKE ONE TABLET BY MOUTH IN THE EVENING (BEDTIME) 07/05/16   [provider]    Family History Family History  Problem Relation Age of Onset  . Diabetes Mother   . Cancer Father   . Cancer Paternal Uncle        prostate    Social History Social History   Tobacco Use  . Smoking status: Current Every Day Smoker    Packs/day: 0.50    Types: Cigarettes  . Smokeless tobacco: Never Used  Substance Use Topics  . Alcohol use: Yes    Comment: occasionally  . Drug use: No     Allergies   Patient has no known allergies.   Review of Systems Review of Systems  Constitutional: Negative for activity change, chills and fever.       All ROS Neg except as noted in HPI  HENT: Negative for nosebleeds.   Eyes: Negative for photophobia and discharge.  Respiratory: Negative for cough, shortness of breath and wheezing.   Cardiovascular: Negative for chest pain and palpitations.  Gastrointestinal: Positive for abdominal pain and blood in stool. Negative for constipation, diarrhea, nausea and vomiting.  Genitourinary: Negative for dysuria, frequency and hematuria.  Musculoskeletal: Negative for arthralgias, back pain and neck pain.  Skin: Negative.   Neurological: Negative for dizziness, seizures and speech difficulty.  Psychiatric/Behavioral: Negative for confusion and hallucinations.     Physical Exam Updated Vital Signs BP 98/87  (BP Location: Right Arm)   Pulse (!) 118   Temp 97.8 F (36.6 C) (Oral)   Resp 18   Ht 6\' 2"  (1.88 m)   Wt (!) 140.6 kg   SpO2 95%   BMI 39.80 kg/m   Physical Exam  Constitutional: He is oriented to person, place, and time. He appears well-developed and well-nourished.  Non-toxic appearance. He appears ill.  HENT:  Head: Normocephalic.  Right Ear: Tympanic membrane and external ear normal.  Left Ear: Tympanic membrane and external ear normal.  Eyes: Pupils are equal, round, and reactive to light. EOM and lids are normal.  Neck: Normal range of motion. Neck supple. Carotid bruit is not present.  Cardiovascular: Regular rhythm, normal heart sounds, intact distal pulses and normal pulses. Tachycardia present.  Pulmonary/Chest: Breath sounds normal. No respiratory distress.  Abdominal: Soft. He exhibits distension. He exhibits no pulsatile midline mass. Bowel sounds are increased. There is generalized tenderness. There is guarding.  Lower abd hernias palpated. Painful to palpation. Hernias not reducible at this time.  Musculoskeletal: Normal range of motion.  Lymphadenopathy:       Head (right side): No submandibular adenopathy present.       Head (left side): No submandibular adenopathy present.    He has no cervical adenopathy.  Neurological: He is alert and oriented to person, place, and time. He has normal strength. No cranial nerve deficit or sensory deficit.  Skin: Skin is warm and dry.  Psychiatric: He has a normal mood and affect. His speech is normal.  Nursing note and vitals reviewed.    ED Treatments / Results  Labs (all labs ordered are listed, but only abnormal results are displayed) Labs Reviewed  LIPASE, BLOOD  COMPREHENSIVE METABOLIC PANEL  CBC  URINALYSIS, ROUTINE W REFLEX MICROSCOPIC    EKG None  Radiology No results found.  Procedures Procedures (including critical care time) CRITICAL CARE Performed by: Lily Kocher Total critical care time:  **35* minutes Critical care time was exclusive of separately billable procedures and treating other patients. Critical care was necessary to treat or prevent imminent or life-threatening deterioration. Critical care was time spent personally by me on the following activities: development of treatment plan with patient and/or surrogate as well as nursing, discussions with consultants, evaluation of patient's response to treatment, examination of patient, obtaining history from patient or surrogate, ordering and performing treatments and interventions, ordering and review of laboratory studies, ordering and review of radiographic studies, pulse oximetry and re-evaluation of patient's condition. Medications Ordered in ED Medications - No data to display   Initial Impression / Assessment and Plan / ED Course  I have reviewed the triage vital signs and the nursing notes.  Pertinent labs & imaging results that were available during my care of the patient were reviewed by me and considered in my medical decision making (see chart for details).       Final Clinical Impressions(s) / ED Diagnoses MDM  Blood pressure is 74-94 systolic.  Pulse oximetry is 95% on room air.  Pulse rate is elevated at 118.  Patient appears uncomfortable.  He has a hard time finding a comfortable position in the stretcher.  IV fluids started.  IV morphine given to the patient for his discomfort.  Nursing staff noted what appears to be a bedbug in the patient's room.  Radiology staff request additional information about whether or not the patient has a bedbug before bringing him to CT.  The CT suite would have to be closed down for deep cleaning if indeed the patient has an infestation of bedbugs.  Housekeeping is aware, and will notify the appropriate pest control.  10:14 AM.  Patient states that the 4 mg of morphine did very little for his pain.  I instructed the patient that his systolic blood pressure was only in the  90s, and I would observe his vital signs closely to see when it was safe to give him additional pain medication.  Complaints of metabolic panel shows the glucose to be elevated at 174, otherwise within normal limits.  The lipase is normal at 21. The complete blood count shows the white blood cells to be elevated at 14,200, there is no shift to the left.  Pro time-INR shows the pro time to be 12.1 seconds, and  the INR is 0.91.  11 AM.  Patient now having active vomiting.  The patient has blood pressure now 125/92 after a 500 mL bolus of fluids.  IV Dilaudid 0.5 mg given to the patient along with IV Zofran.  The patient will have portable chest and abdomen films done for now. Case discussed with Dr Thurnell Garbe  The lactic acid is normal at 1.3, and the lipase is normal at 21.  Patient initially refused NG tube, but now agrees to have NG tube inserted. Chest x-ray portable suggest that the NG tube is overlying the EG junction.  And advancement is recommended.  The tube was advanced by nursing staff.  12:22 PM.  Patient states he is still having a great deal of pain.  He says he cannot find a comfortable position to be in.  1 mg of Dilaudid IV has been given to the patient. NG to intermittent suction.  Acute abdomen series shows a moderate to large amount of gas noted in what appears to be the right colon.  There are a few distended small bowel loops noted small bowel obstruction is not excluded.  No definite pneumoperitoneum appreciated.  On the chest, there is noted some cardiomegaly with pulmonary vascular congestion. Results given to the patient. Pt in agreement for admission. Pain much improved after additional medication. Vital signs stable. Case discussed with Dr Thurnell Garbe  Case discussed with Hospitalist. Pt to be admitted. Call placed to surgery consultant.  Case discussed with Dr Arnoldo Morale. He will consult on pt.   Final diagnoses:  Small bowel obstruction due to adhesions [facility])    ED  Discharge Orders    None       Lily Kocher, PA-C 10/01/17 1416    Francine Graven, DO 10/02/17 1548

## 2017-10-01 NOTE — ED Notes (Signed)
HB in to assess 

## 2017-10-01 NOTE — ED Notes (Signed)
Pt reports he was at [facility] last month for same complaint  They discussed surgery but his obstruction cleared per pt and he was discharged

## 2017-10-02 ENCOUNTER — Inpatient Hospital Stay (HOSPITAL_COMMUNITY): Payer: Medicare Other

## 2017-10-02 LAB — BASIC METABOLIC PANEL
Anion gap: 10 (ref 5–15)
BUN: 29 mg/dL — AB (ref 6–20)
CHLORIDE: 98 mmol/L (ref 98–111)
CO2: 31 mmol/L (ref 22–32)
CREATININE: 1.02 mg/dL (ref 0.61–1.24)
Calcium: 8.5 mg/dL — ABNORMAL LOW (ref 8.9–10.3)
GFR calc Af Amer: >60 mL/min (ref >60)
GFR calc non Af Amer: >60 mL/min (ref >60)
Glucose, Bld: 190 mg/dL — ABNORMAL HIGH (ref 70–99)
Potassium: 3.8 mmol/L (ref 3.5–5.1)
SODIUM: 139 mmol/L (ref 135–145)

## 2017-10-02 LAB — CBC
HCT: 42.9 % (ref 39.0–52.0)
Hemoglobin: 13.9 g/dL (ref 13.0–17.0)
MCH: 28 pg (ref 26.0–34.0)
MCHC: 32.4 g/dL (ref 30.0–36.0)
MCV: 86.5 fL (ref 78.0–100.0)
PLATELETS: 148 10*3/uL — AB (ref 150–400)
RBC: 4.96 MIL/uL (ref 4.22–5.81)
RDW: 15.1 % (ref 11.5–15.5)
WBC: 8.3 10*3/uL (ref 4.0–10.5)

## 2017-10-02 LAB — GLUCOSE, CAPILLARY
Glucose-Capillary: 151 mg/dL — ABNORMAL HIGH (ref 70–99)
Glucose-Capillary: 161 mg/dL — ABNORMAL HIGH (ref 70–99)

## 2017-10-02 MED ORDER — MAGNESIUM SULFATE 4 GM/100ML IV SOLN
4.0000 g | Freq: Once | INTRAVENOUS | Status: AC
  Administered 2017-10-02: 4 g via INTRAVENOUS
  Filled 2017-10-02: qty 100

## 2017-10-02 NOTE — Progress Notes (Signed)
Pt insisting on leaving AMA. MD has been paged and has seen patient. MD has discussed the risks and complications associated with NG tube removal and leaving against medical advice including worsening of symptoms and further complications. Pt still insistent on leaving. AMA, paper has been signed by patient and is in chart. IV has been removed and is clean dry and intact.

## 2017-10-02 NOTE — Consult Note (Signed)
Reason for Consult: Partial small bowel obstruction, incisional hernia Referring Physician: Dr. Colman Cater Dale Franklin is an 57 y.o. male.  HPI: Patient is a 57 year old morbidly obese white male with multiple medical problems including COPD, diabetes mellitus, prostate cancer, and hypertension who presented to [facility] with worsening nausea, vomiting, abdominal pain.  He previously was admitted to the hospital at [facility] in June of this year with a partial small bowel obstruction secondary to his multiple abdominal previous surgeries and incisional hernia repairs.  His bowel obstruction was relieved with NG tube decompression.  Surgery saw him at the time and felt that he was a high risk surgical candidate and could not be operated on at [facility] due to the complexity of his hernias.  Patient states that 6 months ago he saw a hernia specialist at [facility] in [place], possibly Dr. Macy Mis.  He was told he had a lose approximately 40 pounds but that they would be more than happy to attempt repair of his abdominal wall.  He was admitted to [facility] and NG tube was placed.  He states he had a bowel movement soon after he was admitted.  His abdominal pain has somewhat eased.  He has had 2 L plus out his NG tube.  Past Medical History:  Diagnosis Date  . Arthritis   . Bowel obstruction ([place])   . COPD (chronic obstructive pulmonary disease) ([place])   . Depression   . Diabetes mellitus without complication ([place])   . Dysrhythmia    A flutter  . Hypertension   . Prostate cancer [facility])     Past Surgical History:  Procedure Laterality Date  . GOLD SEED IMPLANT N/A 05/31/2016   Procedure: GOLD SEED IMPLANT;  Surgeon: Franchot Gallo, MD;  Location: [facility];  Service: Urology;  Laterality: N/A;  . HAND SURGERY Right   . HERNIA REPAIR    . TRANSURETHRAL RESECTION OF PROSTATE N/A 05/31/2016   Procedure: TRANSURETHRAL RESECTION OF THE PROSTATE (TURP);  Surgeon: Franchot Gallo, MD;  Location[facility];  Service: Urology;  Laterality: N/A;  . WRIST SURGERY Right     Family History  Problem Relation Age of Onset  . Diabetes Mother   . Cancer Father   . Cancer Paternal Uncle        prostate    Social History:  reports that he has been smoking cigarettes. He has been smoking about 0.50 packs per day. He has never used smokeless tobacco. He reports that he drinks alcohol. He reports that he does not use drugs.  Allergies: No Known Allergies  Medications: I have reviewed the patient's current medications.  Results for orders placed or performed during the hospital encounter of 10/01/17 (from the past 48 hour(s))  Protime-INR     Status: None   Collection Time: 10/01/17  8:45 AM  Result Value Ref Range   Prothrombin Time 12.1 11.4 - 15.2 seconds   INR 0.91     Comment: Performed at [facility], [address] [place], [place] [address]  Lipase, blood     Status: None   Collection Time: 10/01/17  9:07 AM  Result Value Ref Range   Lipase 21 11 - 51 U/L    Comment: Performed at [facility], [address], [place],  [address]  Comprehensive metabolic panel     Status: Abnormal   Collection Time: 10/01/17  9:07 AM  Result Value Ref Range   Sodium 137 135 - 145 mmol/L  Potassium 4.4 3.5 - 5.1 mmol/L   Chloride 98 98 - 111 mmol/L   CO2 28 22 - 32 mmol/L   Glucose, Bld 174 (H) 70 - 99 mg/dL   BUN 18 6 - 20 mg/dL   Creatinine, Ser 1.07 0.61 - 1.24 mg/dL   Calcium 9.6 8.9 - 10.3 mg/dL   Total Protein 7.0 6.5 - 8.1 g/dL   Albumin 3.7 3.5 - 5.0 g/dL   AST 16 15 - 41 U/L   ALT 20 0 - 44 U/L   Alkaline Phosphatase 95 38 - 126 U/L   Total Bilirubin 0.6 0.3 - 1.2 mg/dL   GFR calc non Af Amer >60 >60 mL/min   GFR calc Af Amer >60 >60 mL/min    Comment: (NOTE) The eGFR has been calculated using the CKD EPI equation. This calculation has not been validated in all clinical situations. eGFR's persistently <60 mL/min signify possible Chronic  Kidney Disease.    Anion gap 11 5 - 15    Comment: Performed at [facility], [address] [place], [place] [address]  CBC     Status: Abnormal   Collection Time: 10/01/17  9:07 AM  Result Value Ref Range   WBC 14.2 (H) 4.0 - 10.5 K/uL   RBC 5.29 4.22 - 5.81 MIL/uL   Hemoglobin 15.3 13.0 - 17.0 g/dL   HCT 45.8 39.0 - 52.0 %   MCV 86.6 78.0 - 100.0 fL   MCH 28.9 26.0 - 34.0 pg   MCHC 33.4 30.0 - 36.0 g/dL   RDW 14.8 11.5 - 15.5 %   Platelets 171 150 - 400 K/uL    Comment: Performed at [facility], [address] [place], [place] [address]  Magnesium     Status: None   Collection Time: 10/01/17  9:07 AM  Result Value Ref Range   Magnesium 1.8 1.7 - 2.4 mg/dL    Comment: Performed at [facility], [address] [place], [place] [address]  Lactic acid, plasma     Status: None   Collection Time: 10/01/17 10:00 AM  Result Value Ref Range   Lactic Acid, Venous 1.3 0.5 - 1.9 mmol/L    Comment: Performed at [facility], [address] [place],  [address]  Glucose, capillary     Status: Abnormal   Collection Time: 10/01/17  4:37 PM  Result Value Ref Range   Glucose-Capillary 159 (H) 70 - 99 mg/dL  Glucose, capillary     Status: Abnormal   Collection Time: 10/01/17  9:50 PM  Result Value Ref Range   Glucose-Capillary 136 (H) 70 - 99 mg/dL   Comment 1 Notify RN    Comment 2 Document in Chart   Glucose, capillary     Status: Abnormal   Collection Time: 10/02/17  7:47 AM  Result Value Ref Range   Glucose-Capillary 151 (H) 70 - 99 mg/dL    Dg Chest Port 1v Same Day  Result Date: 10/01/2017 CLINICAL DATA:  NG tube placement EXAM: PORTABLE CHEST 1 VIEW COMPARISON:  10/01/2017 and prior studies FINDINGS: An NG tube is noted with tip at the EG junction. Cardiomegaly and pulmonary vascular congestion again noted. No pneumothorax or definite pleural effusion. No other changes noted. IMPRESSION: NG tube with tip overlying the EG junction.  Recommend advancement. Cardiomegaly with  pulmonary vascular congestion. Electronically Signed   By: Margarette Canada M.D.   On: 10/01/2017 12:02   Dg Abdomen Acute W/chest  Result Date: 10/01/2017 CLINICAL DATA:  Acute abdominal  pain and vomiting. EXAM: DG ABDOMEN ACUTE W/ 1V CHEST COMPARISON:  07/21/2017 and prior radiographs FINDINGS: Cardiomegaly and pulmonary vascular congestion noted. No airspace disease, definite pleural effusion or pneumothorax. A few distended small bowel loops are noted. A moderate to large amount of gas is noted in what appears to be the RIGHT colon. Gas in stomach is present. No definite pneumoperitoneum. IMPRESSION: 1. Nonspecific bowel gas pattern with few distended small bowel loops noted. Small-bowel obstruction is not excluded. No definite pneumoperitoneum. 2. Cardiomegaly with pulmonary vascular congestion. Electronically Signed   By: Margarette Canada M.D.   On: 10/01/2017 12:04   Dg Abd Portable 2v  Result Date: 10/02/2017 CLINICAL DATA:  Small-bowel obstruction EXAM: PORTABLE ABDOMEN - 2 VIEW COMPARISON:  10/01/2017 FINDINGS: Suboptimal image due to patient size Improvement in small bowel dilatation. Dilated right colon mildly improved. No free air on the upright view. IMPRESSION: Improvement in large and small bowel dilatation most compatible with improving ileus or possibly small-bowel obstruction. Electronically Signed   By: Franchot Gallo M.D.   On: 10/02/2017 08:41    ROS:  Pertinent items are noted in HPI.  Blood pressure (!) 131/105, pulse (!) 120, temperature 97.9 F (36.6 C), temperature source Oral, resp. rate 14, height '6\' 3"'  (1.905 m), weight (!) 144.9 kg, SpO2 95 %. Physical Exam: Pleasant white male no acute distress Head is normocephalic, atraumatic Lungs clear to auscultation with equal breath sounds bilaterally Heart examination reveals a tachycardic rhythm. Abdomen is soft with occasional bowel sounds appreciated.  There is a large midline surgical scar with multiple areas of easily reducible  soft incisional hernias.  No rigidity is noted.  X-rays personally reviewed.  This morning's x-ray shows a resolving bowel obstruction versus ileus.  Assessment/Plan: Impression: Partial bowel obstruction secondary to adhesive disease, incisional hernias. Plan: No need for acute surgical intervention.  Would continue NG tube decompression for another 24 hours.  Patient was informed that he needs to follow-up with the surgeon in [place] for further management and treatment.  Aviva Signs 10/02/2017, 9:35 AM

## 2017-10-02 NOTE — Discharge Summary (Signed)
Physician Discharge Summary  Dale Franklin TIW:[id number] DOB: Dec 14, 1960 DOA: 10/01/2017  PCP: Karsten Ro, DO  Admit date: 10/01/2017 Discharge date: 10/02/2017  Admitted From: Home Disposition: Patient signed out AMA  Recommendations for Outpatient Follow-up:  1. Follow up with PCP in 1 week.  Instructed to return to ED if he has persistent abdominal pain, nausea and vomiting, fevers or chills.  Home Health: None Equipment/Devices: None  Discharge Condition: Guarded CODE STATUS: Full code Diet recommendation: Recommend to be n.p.o. at this time.    Discharge Diagnoses:  Principal Problem:   Small bowel obstruction due to adhesions [facility])  Active Problems:   Malignant neoplasm of prostate (HCC)   Bladder outlet obstruction   COPD (chronic obstructive pulmonary disease) (HCC)   Diabetes mellitus without complication (HCC)   Hypertension   Tobacco abuse   Atrial fibrillation, chronic (HCC)   Prolonged QT interval   Bedbug bite  Brief narrative/HPI 57 y.o. male, with history of hypertension, diabetes mellitus, A. fib on Coumadin, obesity, ventral hernia repair with recurrent small bowel obstruction who was last hospitalized 2 months back with small bowel obstruction that was managed nonoperatively presented to the ED with generalized abdominal pain more prominent in the infraumbilical area around his hernia since yesterday.  Patient reports that he noticed some dark stool 2 weeks back and his urologist told him to be off Coumadin. Patient reports feeling hot but does not have fever or chills.  Reports feeling nauseated and had an episode of vomiting in the ED.  He denies any headache, blurred vision, dizziness, chest pain, shortness of breath, palpitations, dysuria, diarrhea or constipation.  Denies any weakness, tingling or numbness of his extremities.  Denies any recent illness or sick contact. In the ED he was tachycardic with distended abdomen and generalized tenderness  with guarding.  Blood pressure was also soft.  Blood work showed WC of 14 K, normal lipase, glucose of 174 and normal chemistry. Given IV fluids and IV morphine ordered by IV Dilaudid.  X-ray of the abdomen showed nonspecific bowel gas pattern with distended small bowel loops suspicious of small bowel obstruction.  CT of the abdomen ordered but not as found a bedbug in the patient's room.  The CT suite had to be close down for deep cleaning since patient had bedbug infestation. NG tube placed in the ED, IV fluids started and hospitalist consult for admission to medical floor.  Surgery consulted by ED physician.  Hospital course  Active Problems: Small bowel obstruction due to adhesions [facility]) Monitored on telemetry and NG to wall suction.  Abdominal pain and distention seems better today but follow-up x-ray although improved still had signs of small bowel obstruction.  Electrolytes normal. Given PRN IV morphine for pain and antiemetics for nausea. Seen by surgery and recommended conservative management and outpatient follow-up with his surgeon in [place]. Patient frequently asking for ice chips and complaining of having discomfort on NG.    This afternoon he told the nurse that he wanted to leave as he did not have any output through the NG if he did not intake does ice chips.  Stated that he was feeling uncomfortable overall.  I spoke to him at length that he has ongoing small bowel obstruction and has good output from his NG.  Discussed medical risk of leaving [place] including worsening of his small bowel obstruction with this complications.  Patient verbally understood these complications and still decided to leave for home.  NG tube removed and  patient signed out AMA.  Diabetes mellitus type 2 without complication ([place]) Home oral medications were held due to n.p.o. status and monitor on SSI  Persistent atrial fibrillation Coumadin was held due to n.p.o.  status.  Prolonged QTC Elect lites normal.  Monitor on telemetry.  Essential hypertension Home blood pressure meds were held and given PRN IV hydralazine.  Bedbug infestation Was given topical steroid ointment.    consults: Surgery   Discharge Instructions   Allergies as of 10/02/2017   No Known Allergies     Medication List    TAKE these medications   albuterol 108 (90 Base) MCG/ACT inhaler Commonly known as:  PROVENTIL HFA;VENTOLIN HFA Inhale 1-2 puffs into the lungs every 6 (six) hours as needed for wheezing or shortness of breath.   DULoxetine 30 MG capsule Commonly known as:  CYMBALTA Take 90 mg by mouth daily.   glipiZIDE 10 MG tablet Commonly known as:  GLUCOTROL Take 10 mg by mouth 2 (two) times daily.   lisinopril-hydrochlorothiazide 10-12.5 MG tablet Commonly known as:  PRINZIDE,ZESTORETIC Take 0.5 tablets by mouth daily.   Melatonin 3 MG Tabs Take 3 mg by mouth at bedtime.   metFORMIN 500 MG 24 hr tablet Commonly known as:  GLUCOPHAGE-XR Take 500 mg by mouth 2 (two) times daily.   metoprolol succinate 25 MG 24 hr tablet Commonly known as:  TOPROL-XL Take 25 mg by mouth 2 (two) times daily.   morphine 15 MG tablet Commonly known as:  MSIR Take 15 mg by mouth every 6 (six) hours as needed for pain.   oxybutynin 5 MG tablet Commonly known as:  DITROPAN Take 5 mg by mouth 3 (three) times daily.   simvastatin 10 MG tablet Commonly known as:  ZOCOR Take 10 mg by mouth at bedtime.   traZODone 100 MG tablet Commonly known as:  DESYREL Take 50 mg by mouth at bedtime.   URIBEL 118 MG Caps Take 1 capsule (118 mg total) by mouth every 8 (eight) hours as needed.   warfarin 2.5 MG tablet Commonly known as:  COUMADIN TAKE ONE TABLET BY MOUTH IN THE EVENING (BEDTIME) What changed:  Another medication with the same name was removed. Continue taking this medication, and follow the directions you see here.      Follow-up Information     Karsten Ro, DO. Schedule an appointment as soon as possible for a visit in 1 week(s).   Specialty:  Family Medicine Contact information: [address] [place] [address] [phone]          No Known Allergies      Procedures/Studies: Dg Chest Port 1v Same Day  Result Date: 10/01/2017 CLINICAL DATA:  NG tube placement EXAM: PORTABLE CHEST 1 VIEW COMPARISON:  10/01/2017 and prior studies FINDINGS: An NG tube is noted with tip at the EG junction. Cardiomegaly and pulmonary vascular congestion again noted. No pneumothorax or definite pleural effusion. No other changes noted. IMPRESSION: NG tube with tip overlying the EG junction.  Recommend advancement. Cardiomegaly with pulmonary vascular congestion. Electronically Signed   By: Margarette Canada M.D.   On: 10/01/2017 12:02   Dg Abdomen Acute W/chest  Result Date: 10/01/2017 CLINICAL DATA:  Acute abdominal pain and vomiting. EXAM: DG ABDOMEN ACUTE W/ 1V CHEST COMPARISON:  07/21/2017 and prior radiographs FINDINGS: Cardiomegaly and pulmonary vascular congestion noted. No airspace disease, definite pleural effusion or pneumothorax. A few distended small bowel loops are noted. A moderate to large amount of gas is  noted in what appears to be the RIGHT colon. Gas in stomach is present. No definite pneumoperitoneum. IMPRESSION: 1. Nonspecific bowel gas pattern with few distended small bowel loops noted. Small-bowel obstruction is not excluded. No definite pneumoperitoneum. 2. Cardiomegaly with pulmonary vascular congestion. Electronically Signed   By: Margarette Canada M.D.   On: 10/01/2017 12:04   Dg Abd Portable 2v  Result Date: 10/02/2017 CLINICAL DATA:  Small-bowel obstruction EXAM: PORTABLE ABDOMEN - 2 VIEW COMPARISON:  10/01/2017 FINDINGS: Suboptimal image due to patient size Improvement in small bowel dilatation. Dilated right colon mildly improved. No free air on the upright view. IMPRESSION: Improvement in large and small bowel  dilatation most compatible with improving ileus or possibly small-bowel obstruction. Electronically Signed   By: Franchot Gallo M.D.   On: 10/02/2017 08:41    (Echo, Carotid, EGD, Colonoscopy, ERCP)    Subjective:   Discharge Exam: Vitals:   10/01/17 2128 10/02/17 0528  BP: (!) 131/100 (!) 131/105  Pulse: (!) 122 (!) 120  Resp: 20 14  Temp: 97.6 F (36.4 C) 97.9 F (36.6 C)  SpO2: 93% 95%   Vitals:   10/01/17 1345 10/01/17 1446 10/01/17 2128 10/02/17 0528  BP: (!) 152/102 (!) 128/92 (!) 131/100 (!) 131/105  Pulse: 97 (!) 103 (!) 122 (!) 120  Resp:  18 20 14   Temp:  98.3 F (36.8 C) 97.6 F (36.4 C) 97.9 F (36.6 C)  TempSrc:   Oral Oral  SpO2: 92% 94% 93% 95%  Weight:  (!) 144.9 kg    Height:  6\' 3"  (1.905 m)     General: Morbidly obese middle-aged male not in distress, irritable HEENT: NG in place draining significant dark bilious output, dry mucosa Chest: Clear bilaterally CVs: Normal S1-S2, no murmurs GI: Mild distention, soft, reducible infraumbilical hernia, tenderness around periumbilical area (better from yesterday) Musculoskeletal: Warm, no edema    The results of significant diagnostics from this hospitalization (including imaging, microbiology, ancillary and laboratory) are listed below for reference.     Microbiology: No results found for this or any previous visit (from the past 240 hour(s)).   Labs: BNP (last 3 results) No results for input(s): BNP in the last 8760 hours. Basic Metabolic Panel: Recent Labs  Lab 10/01/17 0907 10/02/17 0857  NA 137 139  K 4.4 3.8  CL 98 98  CO2 28 31  GLUCOSE 174* 190*  BUN 18 29*  CREATININE 1.07 1.02  CALCIUM 9.6 8.5*  MG 1.8  --    Liver Function Tests: Recent Labs  Lab 10/01/17 0907  AST 16  ALT 20  ALKPHOS 95  BILITOT 0.6  PROT 7.0  ALBUMIN 3.7   Recent Labs  Lab 10/01/17 0907  LIPASE 21   No results for input(s): AMMONIA in the last 168 hours. CBC: Recent Labs  Lab 10/01/17 0907  10/02/17 0857  WBC 14.2* 8.3  HGB 15.3 13.9  HCT 45.8 42.9  MCV 86.6 86.5  PLT 171 148*   Cardiac Enzymes: No results for input(s): CKTOTAL, CKMB, CKMBINDEX, TROPONINI in the last 168 hours. BNP: Invalid input(s): POCBNP CBG: Recent Labs  Lab 10/01/17 1637 10/01/17 2150 10/02/17 0747 10/02/17 1123  GLUCAP 159* 136* 151* 161*   D-Dimer No results for input(s): DDIMER in the last 72 hours. Hgb A1c No results for input(s): HGBA1C in the last 72 hours. Lipid Profile No results for input(s): CHOL, HDL, LDLCALC, TRIG, CHOLHDL, LDLDIRECT in the last 72 hours. Thyroid function studies No results for input(s): TSH,  T4TOTAL, T3FREE, THYROIDAB in the last 72 hours.  Invalid input(s): FREET3 Anemia work up No results for input(s): VITAMINB12, FOLATE, FERRITIN, TIBC, IRON, RETICCTPCT in the last 72 hours. Urinalysis    Component Value Date/Time   COLORURINE YELLOW 07/21/2017 2230   APPEARANCEUR HAZY (A) 07/21/2017 2230   LABSPEC 1.031 (H) 07/21/2017 2230   PHURINE 5.0 07/21/2017 2230   GLUCOSEU NEGATIVE 07/21/2017 2230   HGBUR NEGATIVE 07/21/2017 2230   BILIRUBINUR NEGATIVE 07/21/2017 2230   KETONESUR 5 (A) 07/21/2017 2230   PROTEINUR NEGATIVE 07/21/2017 2230   NITRITE NEGATIVE 07/21/2017 2230   LEUKOCYTESUR NEGATIVE 07/21/2017 2230   Sepsis Labs Invalid input(s): PROCALCITONIN,  WBC,  LACTICIDVEN Microbiology No results found for this or any previous visit (from the past 240 hour(s)).   Time coordinating discharge: <30 minutes  SIGNED:   Louellen Molder, MD  [facility] 10/02/2017, 12:45 PM Pager   If 7PM-7AM, please contact night-coverage [email or web address] Password [email or web address]

## 2017-10-02 NOTE — Discharge Instructions (Signed)
Small Bowel Obstruction °A small bowel obstruction means that something is blocking the small bowel. The small bowel is also called the small intestine. It is the long tube that connects the stomach to the colon. An obstruction will stop food and fluids from passing through the small bowel. Treatment depends on what is causing the problem and how bad the problem is. °Follow these instructions at home: °· Get a lot of rest. °· Follow your diet as told by your doctor. You may need to: °? Only drink clear liquids until you start to get better. °? Avoid solid foods as told by your doctor. °· Take over-the-counter and prescription medicines only as told by your doctor. °· Keep all follow-up visits as told by your doctor. This is important. °Contact a doctor if: °· You have a fever. °· You have chills. °Get help right away if: °· You have pain or cramps that get worse. °· You throw up (vomit) blood. °· You have a feeling of being sick to your stomach (nausea) that does not go away. °· You cannot stop throwing up. °· You cannot drink fluids. °· You feel confused. °· You feel dry or thirsty (dehydrated). °· Your belly gets more bloated. °· You feel weak or you pass out (faint). °This information is not intended to replace advice given to you by your health care provider. Make sure you discuss any questions you have with your health care provider. °Document Released: 03/11/2004 Document Revised: 09/29/2015 Document Reviewed: 03/28/2014 °Elsevier Interactive Patient Education © 2018 Elsevier Inc. ° °

## 2017-10-06 DIAGNOSIS — K56600 Partial intestinal obstruction, unspecified as to cause: Secondary | ICD-10-CM | POA: Diagnosis not present

## 2017-10-06 DIAGNOSIS — I482 Chronic atrial fibrillation: Secondary | ICD-10-CM | POA: Diagnosis not present

## 2017-10-06 DIAGNOSIS — I10 Essential (primary) hypertension: Secondary | ICD-10-CM | POA: Diagnosis not present

## 2017-10-06 DIAGNOSIS — E1165 Type 2 diabetes mellitus with hyperglycemia: Secondary | ICD-10-CM | POA: Diagnosis not present

## 2017-10-13 ENCOUNTER — Encounter (INDEPENDENT_AMBULATORY_CARE_PROVIDER_SITE_OTHER): Payer: Self-pay | Admitting: Internal Medicine

## 2017-10-13 ENCOUNTER — Ambulatory Visit (INDEPENDENT_AMBULATORY_CARE_PROVIDER_SITE_OTHER): Payer: Medicare Other | Admitting: Internal Medicine

## 2017-10-13 VITALS — BP 140/70 | HR 60 | Temp 97.0°F | Ht 74.0 in | Wt 304.5 lb

## 2017-10-13 DIAGNOSIS — M79629 Pain in unspecified upper arm: Secondary | ICD-10-CM | POA: Diagnosis not present

## 2017-10-13 DIAGNOSIS — K625 Hemorrhage of anus and rectum: Secondary | ICD-10-CM | POA: Diagnosis not present

## 2017-10-13 DIAGNOSIS — M25512 Pain in left shoulder: Secondary | ICD-10-CM | POA: Diagnosis not present

## 2017-10-13 DIAGNOSIS — Z79891 Long term (current) use of opiate analgesic: Secondary | ICD-10-CM | POA: Diagnosis not present

## 2017-10-13 DIAGNOSIS — G4733 Obstructive sleep apnea (adult) (pediatric): Secondary | ICD-10-CM | POA: Diagnosis not present

## 2017-10-13 DIAGNOSIS — G894 Chronic pain syndrome: Secondary | ICD-10-CM | POA: Diagnosis not present

## 2017-10-13 NOTE — Patient Instructions (Signed)
The risks of bleeding, perforation and infection were reviewed with patient.  

## 2017-10-13 NOTE — Progress Notes (Addendum)
Subjective:    Patient ID: Dale Franklin, male    DOB: 05/01/1960, 57 y.o.   MRN: [id number]  HPI Referred by Dr. Nadara Mustard for rectal bleeding. Occurred a month ago. Rectal bleeding for about a month. Seen at [facility] for dark stools, abdominal distention, nausea and vomiting.  Underwent an Xray which revealed: 1. Nonspecific bowel gas pattern with few distended small bowel loops noted. Small-bowel obstruction is not excluded. No definite pneumoperitoneum. 2. Cardiomegaly with pulmonary vascular congestion. Has had 2 recent bowel obstructions in June and in August of this year and admitted to [facility]. Hx of prostate cancer, diagnosed in 2018. Followed by [facility]. Hx of  COPD, atrial fib, asthma, depression, diabetes, GERD, hypertension, sleep apnea, morbid obesity.      . Maintained on Warfarin for atrial fib. No family hx of colon cancer.  He states his BMs are normal now. No recent rectal bleeding.  Hx of ventral hernia repair x 5 in the past.  Appetite is good. Wt loss 26 pounds which was intentional. Planning on having repeat ventral hernia surgery and was told he needed to lose weight. Review of Systems Past Medical History:  Diagnosis Date  . Arthritis   . Bowel obstruction ([place])   . COPD (chronic obstructive pulmonary disease) ([place])   . Depression   . Diabetes mellitus without complication ([place])   . Dysrhythmia    A flutter  . Hypertension   . Prostate cancer [facility])     Past Surgical History:  Procedure Laterality Date  . GOLD SEED IMPLANT N/A 05/31/2016   Procedure: GOLD SEED IMPLANT;  Surgeon: Franchot Gallo, MD;  Location: [facility];  Service: Urology;  Laterality: N/A;  . HAND SURGERY Right   . HERNIA REPAIR    . TRANSURETHRAL RESECTION OF PROSTATE N/A 05/31/2016   Procedure: TRANSURETHRAL RESECTION OF THE PROSTATE (TURP);  Surgeon: Franchot Gallo, MD;  Location: [facility];  Service: Urology;  Laterality: N/A;  . WRIST SURGERY Right     No Known Allergies  Current  Outpatient Medications on File Prior to Visit  Medication Sig Dispense Refill  . albuterol (PROVENTIL HFA;VENTOLIN HFA) 108 (90 Base) MCG/ACT inhaler Inhale 1-2 puffs into the lungs every 6 (six) hours as needed for wheezing or shortness of breath.    . DULoxetine (CYMBALTA) 30 MG capsule Take 90 mg by mouth daily.   5  . glipiZIDE (GLUCOTROL) 10 MG tablet Take 10 mg by mouth 2 (two) times daily.    Marland Kitchen leuprolide (LUPRON) 11.25 MG injection Inject into the muscle every 4 (four) months.    Marland Kitchen lisinopril-hydrochlorothiazide (PRINZIDE,ZESTORETIC) 10-12.5 MG tablet Take 0.5 tablets by mouth daily.   12  . metFORMIN (GLUCOPHAGE-XR) 500 MG 24 hr tablet Take 500 mg by mouth 2 (two) times daily.   3  . metoprolol succinate (TOPROL-XL) 25 MG 24 hr tablet Take 25 mg by mouth 2 (two) times daily.   3  . morphine (MSIR) 15 MG tablet Take 30 mg by mouth every 6 (six) hours.   0  . oxybutynin (DITROPAN) 5 MG tablet Take 5 mg by mouth 3 (three) times daily.     . simvastatin (ZOCOR) 10 MG tablet Take 10 mg by mouth at bedtime.  12  . traZODone (DESYREL) 100 MG tablet Take 50 mg by mouth at bedtime.   3  . warfarin (COUMADIN) 2.5 MG tablet TAKE ONE TABLET BY MOUTH IN THE EVENING (BEDTIME)     No current facility-administered medications on file prior  to visit.         Objective:   Physical Exam Blood pressure 140/70, pulse 60, temperature (!) 97 F (36.1 C), height 6\' 2"  (1.88 m), weight (!) 304 lb 8 oz (138.1 kg). Alert and oriented. Skin warm and dry. Oral mucosa is moist.   . Sclera anicteric, conjunctivae is pink. Thyroid not enlarged. No cervical lymphadenopathy. Lungs clear. Heart regular rate and rhythm.  Abdomen is soft. Bowel sounds are positive. No hepatomegaly. No abdominal masses felt. No tenderness.  No edema to lower extremities. Morbidly obese.          Assessment & Plan:  Rectal bleeding. Colonic neoplasm needs to be ruled out.  Colonoscopy with propofol.

## 2017-10-18 ENCOUNTER — Encounter (INDEPENDENT_AMBULATORY_CARE_PROVIDER_SITE_OTHER): Payer: Self-pay | Admitting: *Deleted

## 2017-10-18 ENCOUNTER — Other Ambulatory Visit (INDEPENDENT_AMBULATORY_CARE_PROVIDER_SITE_OTHER): Payer: Self-pay | Admitting: Internal Medicine

## 2017-10-18 ENCOUNTER — Telehealth (INDEPENDENT_AMBULATORY_CARE_PROVIDER_SITE_OTHER): Payer: Self-pay | Admitting: *Deleted

## 2017-10-18 DIAGNOSIS — I482 Chronic atrial fibrillation: Secondary | ICD-10-CM | POA: Diagnosis not present

## 2017-10-18 DIAGNOSIS — M62830 Muscle spasm of back: Secondary | ICD-10-CM | POA: Diagnosis not present

## 2017-10-18 DIAGNOSIS — K625 Hemorrhage of anus and rectum: Secondary | ICD-10-CM | POA: Insufficient documentation

## 2017-10-18 DIAGNOSIS — M542 Cervicalgia: Secondary | ICD-10-CM | POA: Diagnosis not present

## 2017-10-18 NOTE — Telephone Encounter (Signed)
Patient needs suprep 

## 2017-10-19 ENCOUNTER — Telehealth (INDEPENDENT_AMBULATORY_CARE_PROVIDER_SITE_OTHER): Payer: Self-pay | Admitting: *Deleted

## 2017-10-19 MED ORDER — SUPREP BOWEL PREP KIT 17.5-3.13-1.6 GM/177ML PO SOLN: 1.0000 | Freq: Once | ORAL | 0 refills | Status: AC

## 2017-10-19 NOTE — Telephone Encounter (Signed)
Per Dr Nadara Mustard patient can stop Warfarin 5 days prior to TCS sch'd 11/04/17, however, he wants him to start back day after procedure if possible

## 2017-10-19 NOTE — Telephone Encounter (Signed)
Noted  

## 2017-10-28 ENCOUNTER — Encounter (HOSPITAL_COMMUNITY): Payer: Self-pay

## 2017-10-28 ENCOUNTER — Encounter (HOSPITAL_COMMUNITY)
Admission: RE | Admit: 2017-10-28 | Discharge: 2017-10-28 | Disposition: A | Discharge status: Home / Self Care | Payer: Medicare Other | Source: Ambulatory Visit | Attending: Internal Medicine | Admitting: Internal Medicine

## 2017-11-01 DIAGNOSIS — I10 Essential (primary) hypertension: Secondary | ICD-10-CM | POA: Diagnosis not present

## 2017-11-01 DIAGNOSIS — K56600 Partial intestinal obstruction, unspecified as to cause: Secondary | ICD-10-CM | POA: Diagnosis not present

## 2017-11-01 DIAGNOSIS — E1165 Type 2 diabetes mellitus with hyperglycemia: Secondary | ICD-10-CM | POA: Diagnosis not present

## 2017-11-01 DIAGNOSIS — I482 Chronic atrial fibrillation: Secondary | ICD-10-CM | POA: Diagnosis not present

## 2017-11-04 ENCOUNTER — Ambulatory Visit (HOSPITAL_COMMUNITY): Payer: Medicare Other | Admitting: Anesthesiology

## 2017-11-04 ENCOUNTER — Ambulatory Visit (HOSPITAL_COMMUNITY)
Admission: RE | Admit: 2017-11-04 | Discharge: 2017-11-04 | Disposition: A | Discharge status: Home / Self Care | Payer: Medicare Other | Source: Ambulatory Visit | Attending: Internal Medicine | Admitting: Internal Medicine

## 2017-11-04 ENCOUNTER — Encounter (HOSPITAL_COMMUNITY)
Admission: RE | Disposition: A | Discharge status: Home / Self Care | Payer: Self-pay | Source: Ambulatory Visit | Attending: Internal Medicine

## 2017-11-04 DIAGNOSIS — J449 Chronic obstructive pulmonary disease, unspecified: Secondary | ICD-10-CM | POA: Diagnosis not present

## 2017-11-04 DIAGNOSIS — E119 Type 2 diabetes mellitus without complications: Secondary | ICD-10-CM | POA: Diagnosis not present

## 2017-11-04 DIAGNOSIS — Z79899 Other long term (current) drug therapy: Secondary | ICD-10-CM | POA: Diagnosis not present

## 2017-11-04 DIAGNOSIS — K635 Polyp of colon: Secondary | ICD-10-CM | POA: Diagnosis not present

## 2017-11-04 DIAGNOSIS — D127 Benign neoplasm of rectosigmoid junction: Secondary | ICD-10-CM | POA: Diagnosis not present

## 2017-11-04 DIAGNOSIS — M1990 Unspecified osteoarthritis, unspecified site: Secondary | ICD-10-CM | POA: Diagnosis not present

## 2017-11-04 DIAGNOSIS — I4891 Unspecified atrial fibrillation: Secondary | ICD-10-CM | POA: Diagnosis not present

## 2017-11-04 DIAGNOSIS — F17210 Nicotine dependence, cigarettes, uncomplicated: Secondary | ICD-10-CM | POA: Insufficient documentation

## 2017-11-04 DIAGNOSIS — K644 Residual hemorrhoidal skin tags: Secondary | ICD-10-CM | POA: Insufficient documentation

## 2017-11-04 DIAGNOSIS — K625 Hemorrhage of anus and rectum: Secondary | ICD-10-CM | POA: Insufficient documentation

## 2017-11-04 DIAGNOSIS — Z8546 Personal history of malignant neoplasm of prostate: Secondary | ICD-10-CM | POA: Insufficient documentation

## 2017-11-04 DIAGNOSIS — Z7984 Long term (current) use of oral hypoglycemic drugs: Secondary | ICD-10-CM | POA: Insufficient documentation

## 2017-11-04 DIAGNOSIS — Z7901 Long term (current) use of anticoagulants: Secondary | ICD-10-CM | POA: Insufficient documentation

## 2017-11-04 DIAGNOSIS — F329 Major depressive disorder, single episode, unspecified: Secondary | ICD-10-CM | POA: Insufficient documentation

## 2017-11-04 DIAGNOSIS — I10 Essential (primary) hypertension: Secondary | ICD-10-CM | POA: Diagnosis not present

## 2017-11-04 DIAGNOSIS — D123 Benign neoplasm of transverse colon: Secondary | ICD-10-CM | POA: Diagnosis not present

## 2017-11-04 DIAGNOSIS — D120 Benign neoplasm of cecum: Secondary | ICD-10-CM | POA: Insufficient documentation

## 2017-11-04 DIAGNOSIS — D125 Benign neoplasm of sigmoid colon: Secondary | ICD-10-CM | POA: Diagnosis not present

## 2017-11-04 HISTORY — PX: POLYPECTOMY: SHX5525

## 2017-11-04 HISTORY — PX: COLONOSCOPY WITH PROPOFOL: SHX5780

## 2017-11-04 LAB — GLUCOSE, CAPILLARY
GLUCOSE-CAPILLARY: 106 mg/dL — AB (ref 70–99)
Glucose-Capillary: 69 mg/dL — ABNORMAL LOW (ref 70–99)
Glucose-Capillary: 65 mg/dL — ABNORMAL LOW (ref 70–99)
Glucose-Capillary: 87 mg/dL (ref 70–99)

## 2017-11-04 SURGERY — COLONOSCOPY WITH PROPOFOL
Anesthesia: Monitor Anesthesia Care

## 2017-11-04 MED ORDER — STERILE WATER FOR IRRIGATION IR SOLN
Status: DC | PRN
  Administered 2017-11-04: 1.5 mL

## 2017-11-04 MED ORDER — LACTATED RINGERS IV SOLN
INTRAVENOUS | Status: DC
  Administered 2017-11-04: 10:00:00 via INTRAVENOUS

## 2017-11-04 MED ORDER — CHLORHEXIDINE GLUCONATE CLOTH 2 % EX PADS: 6.0000 | MEDICATED_PAD | Freq: Once | CUTANEOUS | Status: DC

## 2017-11-04 MED ORDER — MIDAZOLAM HCL 2 MG/2ML IJ SOLN
INTRAMUSCULAR | Status: AC
  Filled 2017-11-04: qty 2

## 2017-11-04 MED ORDER — PROPOFOL 10 MG/ML IV BOLUS
INTRAVENOUS | Status: AC
  Filled 2017-11-04: qty 40

## 2017-11-04 MED ORDER — MIDAZOLAM HCL 5 MG/5ML IJ SOLN
INTRAMUSCULAR | Status: DC | PRN
  Administered 2017-11-04: 2 mg via INTRAVENOUS

## 2017-11-04 MED ORDER — PROPOFOL 500 MG/50ML IV EMUL
INTRAVENOUS | Status: DC | PRN
  Administered 2017-11-04 (×2): via INTRAVENOUS
  Administered 2017-11-04: 175 ug/kg/min via INTRAVENOUS

## 2017-11-04 NOTE — H&P (Signed)
Dale Franklin is an 57 y.o. male.   Chief Complaint: Patient is here for colonoscopy. HPI: Patient is 57 year old Caucasian male with multiple medical problems who is anticoagulated for atrial fibrillation who experienced rectal bleeding few weeks ago lasting for 4 days.  He would pass blood with his bowel movement and also without his bowel movements.  He describes it to be fresh and small to moderate in amount.  He did pass blood this morning following prep was moderate amount.  He denies diarrhea constipation.  He does have lower abdominal pain primarily in the left side.  He has good appetite.  He has not lost any weight.  He does not take OTC NSAIDs.  He has been off warfarin for 4 days. He has never been screened for CRC. Family history is negative for CRC.  Past Medical History:  Diagnosis Date  . Arthritis   . Bowel obstruction ([place])   . COPD (chronic obstructive pulmonary disease) ([place])   . Depression   . Diabetes mellitus without complication ([place])   . Dysrhythmia    A flutter  . Hypertension   . Prostate cancer [facility])     Past Surgical History:  Procedure Laterality Date  . GOLD SEED IMPLANT N/A 05/31/2016   Procedure: GOLD SEED IMPLANT;  Surgeon: Franchot Gallo, MD;  Location: [facility];  Service: Urology;  Laterality: N/A;  . HAND SURGERY Right   . HERNIA REPAIR    . TRANSURETHRAL RESECTION OF PROSTATE N/A 05/31/2016   Procedure: TRANSURETHRAL RESECTION OF THE PROSTATE (TURP);  Surgeon: Franchot Gallo, MD;  Location: [facility];  Service: Urology;  Laterality: N/A;  . WRIST SURGERY Right     Family History  Problem Relation Age of Onset  . Diabetes Mother   . Cancer Father   . Cancer Paternal Uncle        prostate   Social History:  reports that he has been smoking cigarettes. He has been smoking about 0.50 packs per day. He has never used smokeless tobacco. He reports that he drinks alcohol. He reports that he does not use drugs.  Allergies: No Known  Allergies  Medications Prior to Admission  Medication Sig Dispense Refill  . baclofen (LIORESAL) 10 MG tablet Take 5-10 mg by mouth 2 (two) times daily as needed (back/neck spasms.).   0  . DULoxetine (CYMBALTA) 30 MG capsule Take 90 mg by mouth daily.   5  . glipiZIDE (GLUCOTROL) 10 MG tablet Take 10 mg by mouth 2 (two) times daily.    Marland Kitchen lisinopril-hydrochlorothiazide (PRINZIDE,ZESTORETIC) 10-12.5 MG tablet Take 1 tablet by mouth daily.   12  . metFORMIN (GLUCOPHAGE-XR) 500 MG 24 hr tablet Take 500 mg by mouth 2 (two) times daily.   3  . metoprolol succinate (TOPROL-XL) 25 MG 24 hr tablet Take 25 mg by mouth 2 (two) times daily.   3  . morphine (MSIR) 30 MG tablet Take 30 mg by mouth every 8 (eight) hours as needed for pain.  0  . oxybutynin (DITROPAN) 5 MG tablet Take 5 mg by mouth 3 (three) times daily.     . simvastatin (ZOCOR) 10 MG tablet Take 10 mg by mouth at bedtime.  12  . traZODone (DESYREL) 50 MG tablet Take 50 mg by mouth at bedtime.    Marland Kitchen warfarin (COUMADIN) 10 MG tablet Take 10 mg by mouth every evening.    . warfarin (COUMADIN) 2.5 MG tablet Take 2.5 mg by mouth at bedtime.     Marland Kitchen  albuterol (PROVENTIL HFA;VENTOLIN HFA) 108 (90 Base) MCG/ACT inhaler Inhale 1-2 puffs into the lungs every 6 (six) hours as needed for wheezing or shortness of breath.    Marland Kitchen leuprolide (LUPRON) 11.25 MG injection Inject 11.25 mg into the muscle every 4 (four) months.       Results for orders placed or performed during the hospital encounter of 11/04/17 (from the past 48 hour(s))  Glucose, capillary     Status: Abnormal   Collection Time: 11/04/17  9:30 AM  Result Value Ref Range   Glucose-Capillary 106 (H) 70 - 99 mg/dL   No results found.  ROS  Blood pressure 125/87, pulse (!) 102, temperature 98 F (36.7 C), temperature source Oral, resp. rate 16, height 6\' 2"  (1.88 m), weight (!) 137.4 kg, SpO2 98 %. Physical Exam  Constitutional:  Obese Caucasian male in NAD.  HENT:  Mouth/Throat:  Oropharynx is clear and moist.  Eyes: Conjunctivae are normal. No scleral icterus.  Neck: No thyromegaly present.  Cardiovascular:  Irregular rhythm normal S1 and S2.  No murmur or gallop noted.  Respiratory: Effort normal and breath sounds normal.  GI:  Abdomen is full with long midline scar.  Hernia noted to the right side of lower end of the scar.  On palpation abdomen is soft.  He has mild tenderness at LLQ without masses.  No organomegaly.   Musculoskeletal: He exhibits no edema.  Lymphadenopathy:    He has no cervical adenopathy.  Neurological: He is alert.  Skin: Skin is warm and dry.   CBC on 10/02/2017 WBC 8.3, H&H 13.9 and 42.9 and platelet count 140 8K.  Assessment/Plan Rectal bleeding in the setting of chronic anticoagulation. Diagnostic colonoscopy under monitored anesthesia care.  Hildred Laser, MD 11/04/2017, 10:01 AM

## 2017-11-04 NOTE — Transfer of Care (Signed)
Immediate Anesthesia Transfer of Care Note  Patient: Dale Franklin  Procedure(s) Performed: COLONOSCOPY WITH PROPOFOL (N/A ) POLYPECTOMY  Patient Location: PACU  Anesthesia Type:MAC  Level of Consciousness: awake and patient cooperative  Airway & Oxygen Therapy: Patient Spontanous Breathing  Post-op Assessment: Report given to RN, Post -op Vital signs reviewed and stable and Patient moving all extremities  Post vital signs: Reviewed and stable  Last Vitals:  Vitals Value Taken Time  BP    Temp    Pulse    Resp    SpO2      Last Pain:  Vitals:   11/04/17 1020  TempSrc:   PainSc: 0-No pain      Patients Stated Pain Goal: 7 (89/21/19 4174)  Complications: No apparent anesthesia complications

## 2017-11-04 NOTE — Op Note (Signed)
[facility] Patient Name: Dale Franklin Procedure Date: 11/04/2017 9:57 AM MRN: [id number] Date of Birth: 03-Mar-1960 Attending MD: Hildred Laser , MD CSN: [id number] Age: 57 Admit Type: Outpatient Procedure:                Colonoscopy Indications:              Rectal bleeding Providers:                Hildred Laser, MD, Charlsie Quest. Theda Sers RN, RN,                            Randa Spike, Technician Referring MD:             Rory Percy, MD Medicines:                Propofol per Anesthesia Complications:            No immediate complications. Estimated Blood Loss:     Estimated blood loss was minimal. Procedure:                Pre-Anesthesia Assessment:                           - Prior to the procedure, a History and Physical                            was performed, and patient medications and                            allergies were reviewed. The patient's tolerance of                            previous anesthesia was also reviewed. The risks                            and benefits of the procedure and the sedation                            options and risks were discussed with the patient.                            All questions were answered, and informed consent                            was obtained. Prior Anticoagulants: The patient                            last took Coumadin (warfarin) 4 days prior to the                            procedure. ASA Grade Assessment: III - A patient                            with severe systemic disease. After reviewing the  risks and benefits, the patient was deemed in                            satisfactory condition to undergo the procedure.                           After obtaining informed consent, the colonoscope                            was passed under direct vision. Throughout the                            procedure, the patient's blood pressure, pulse, and                            oxygen  saturations were monitored continuously. The                            [id number]-H190DL ([id number]) scope was introduced through                            the and advanced to the the cecum, identified by                            appendiceal orifice and ileocecal valve. The                            colonoscopy was performed without difficulty. The                            patient tolerated the procedure well. The quality                            of the bowel preparation was adequate. The                            ileocecal valve, appendiceal orifice, and rectum                            were photographed. Scope In: 10:31:34 AM Scope Out: 11:02:10 AM Scope Withdrawal Time: 0 hours 25 minutes 22 seconds  Total Procedure Duration: 0 hours 30 minutes 36 seconds  Findings:      The perianal and digital rectal examinations were normal.      Four sessile polyps were found in the recto-sigmoid colon, transverse       colon and cecum. The polyps were 4 to 7 mm in size. These polyps were       removed with a cold snare. Resection and retrieval were complete. The       pathology specimen was placed into Bottle Number 1.      External hemorrhoids were found during retroflexion. The hemorrhoids       were small. Impression:               - Four 4 to 7 mm polyps at the recto-sigmoid colon,  in the transverse colon and in the cecum, removed                            with a cold snare. Resected and retrieved.                           - External hemorrhoids. Moderate Sedation:      Per Anesthesia Care Recommendation:           - Patient has a contact number available for                            emergencies. The signs and symptoms of potential                            delayed complications were discussed with the                            patient. Return to normal activities tomorrow.                            Written discharge instructions were provided to the                             patient.                           - Resume previous diet today.                           - Continue present medications.                           - Resume Coumadin (warfarin) at prior dose today.                            Refer to [facility] for further adjustment of                            therapy.                           - Await pathology results.                           - Repeat colonoscopy is recommended. The                            colonoscopy date will be determined after pathology                            results from today's exam become available for                            review. Procedure Code(s):        --- Professional ---  45385, Colonoscopy, flexible; with removal of                            tumor(s), polyp(s), or other lesion(s) by snare                            technique Diagnosis Code(s):        --- Professional ---                           D12.7, Benign neoplasm of rectosigmoid junction                           D12.3, Benign neoplasm of transverse colon (hepatic                            flexure or splenic flexure)                           D12.0, Benign neoplasm of cecum                           K64.4, Residual hemorrhoidal skin tags                           K62.5, Hemorrhage of anus and rectum CPT copyright 2017 American Medical Association. All rights reserved. The codes documented in this report are preliminary and upon coder review may  be revised to meet current compliance requirements. Hildred Laser, MD Hildred Laser, MD 11/04/2017 11:10:58 AM This report has been signed electronically. Number of Addenda: 0

## 2017-11-04 NOTE — Discharge Instructions (Signed)
Resume warfarin this evening as usual dose.  Get INR checked in 7 to 10 days. Resume other medications as before. Resume usual diet. No driving for 24 hours. Physician will call with biopsy results.       Colonoscopy, Adult, Care After This sheet gives you information about how to care for yourself after your procedure. Your doctor may also give you more specific instructions. If you have problems or questions, call your doctor. Follow these instructions at home: General instructions   For the first 24 hours after the procedure: ? Do not drive or use machinery. ? Do not sign important documents. ? Do not drink alcohol. ? Do your daily activities more slowly than normal. ? Eat foods that are soft and easy to digest. ? Rest often.  Take over-the-counter or prescription medicines only as told by your doctor.  It is up to you to get the results of your procedure. Ask your doctor, or the department performing the procedure, when your results will be ready. To help cramping and bloating:  Try walking around.  Put heat on your belly (abdomen) as told by your doctor. Use a heat source that your doctor recommends, such as a moist heat pack or a heating pad. ? Put a towel between your skin and the heat source. ? Leave the heat on for 20-30 minutes. ? Remove the heat if your skin turns bright red. This is especially important if you cannot feel pain, heat, or cold. You can get burned. Eating and drinking  Drink enough fluid to keep your pee (urine) clear or pale yellow.  Return to your normal diet as told by your doctor. Avoid heavy or fried foods that are hard to digest.  Avoid drinking alcohol for as long as told by your doctor. Contact a doctor if:  You have blood in your poop (stool) 2-3 days after the procedure. Get help right away if:  You have more than a small amount of blood in your poop.  You see large clumps of tissue (blood clots) in your poop.  Your belly is  swollen.  You feel sick to your stomach (nauseous).  You throw up (vomit).  You have a fever.  You have belly pain that gets worse, and medicine does not help your pain. This information is not intended to replace advice given to you by your health care provider. Make sure you discuss any questions you have with your health care provider. Document Released: 03/06/2010 Document Revised: 10/27/2015 Document Reviewed: 10/27/2015 Elsevier Interactive Patient Education  2017 [place].     Colon Polyps Polyps are tissue growths inside the body. Polyps can grow in many places, including the large intestine (colon). A polyp may be a round bump or a mushroom-shaped growth. You could have one polyp or several. Most colon polyps are noncancerous (benign). However, some colon polyps can become cancerous over time. What are the causes? The exact cause of colon polyps is not known. What increases the risk? This condition is more likely to develop in people who:  Have a family history of colon cancer or colon polyps.  Are older than 5 or older than 45 if they are African American.  Have inflammatory bowel disease, such as ulcerative colitis or Crohn disease.  Are overweight.  Smoke cigarettes.  Do not get enough exercise.  Drink too much alcohol.  Eat a diet that is: ? High in fat and red meat. ? Low in fiber.  Had childhood cancer that was  treated with abdominal radiation.  What are the signs or symptoms? Most polyps do not cause symptoms. If you have symptoms, they may include:  Blood coming from your rectum when having a bowel movement.  Blood in your stool.The stool may look dark red or black.  A change in bowel habits, such as constipation or diarrhea.  How is this diagnosed? This condition is diagnosed with a colonoscopy. This is a procedure that uses a lighted, flexible scope to look at the inside of your colon. How is this treated? Treatment for this condition  involves removing any polyps that are found. Those polyps will then be tested for cancer. If cancer is found, your health care provider will talk to you about options for colon cancer treatment. Follow these instructions at home: Diet  Eat plenty of fiber, such as fruits, vegetables, and whole grains.  Eat foods that are high in calcium and vitamin D, such as milk, cheese, yogurt, eggs, liver, fish, and broccoli.  Limit foods high in fat, red meats, and processed meats, such as hot dogs, sausage, bacon, and lunch meats.  Maintain a healthy weight, or lose weight if recommended by your health care provider. General instructions  Do not smoke cigarettes.  Do not drink alcohol excessively.  Keep all follow-up visits as told by your health care provider. This is important. This includes keeping regularly scheduled colonoscopies. Talk to your health care provider about when you need a colonoscopy.  Exercise every day or as told by your health care provider. Contact a health care provider if:  You have new or worsening bleeding during a bowel movement.  You have new or increased blood in your stool.  You have a change in bowel habits.  You unexpectedly lose weight. This information is not intended to replace advice given to you by your health care provider. Make sure you discuss any questions you have with your health care provider. Document Released: 10/29/2003 Document Revised: 07/10/2015 Document Reviewed: 12/23/2014 Elsevier Interactive Patient Education  2018 [place], Care After These instructions provide you with information about caring for yourself after your procedure. Your health care provider may also give you more specific instructions. Your treatment has been planned according to current medical practices, but problems sometimes occur. Call your health care provider if you have any problems or questions after your procedure. What can I  expect after the procedure? After your procedure, it is common to:  Feel sleepy for several hours.  Feel clumsy and have poor balance for several hours.  Feel forgetful about what happened after the procedure.  Have poor judgment for several hours.  Feel nauseous or vomit.  Have a sore throat if you had a breathing tube during the procedure.  Follow these instructions at home: For at least 24 hours after the procedure:   Do not: ? Participate in activities in which you could fall or become injured. ? Drive. ? Use heavy machinery. ? Drink alcohol. ? Take sleeping pills or medicines that cause drowsiness. ? Make important decisions or sign legal documents. ? Take care of children on your own.  Rest. Eating and drinking  Follow the diet that is recommended by your health care provider.  If you vomit, drink water, juice, or soup when you can drink without vomiting.  Make sure you have little or no nausea before eating solid foods. General instructions  Have a responsible adult stay with you until you are awake and  alert.  Take over-the-counter and prescription medicines only as told by your health care provider.  If you smoke, do not smoke without supervision.  Keep all follow-up visits as told by your health care provider. This is important. Contact a health care provider if:  You keep feeling nauseous or you keep vomiting.  You feel light-headed.  You develop a rash.  You have a fever. Get help right away if:  You have trouble breathing. This information is not intended to replace advice given to you by your health care provider. Make sure you discuss any questions you have with your health care provider. Document Released: 05/25/2015 Document Revised: 09/24/2015 Document Reviewed: 05/25/2015 Elsevier Interactive Patient Education  [employer].

## 2017-11-04 NOTE — Progress Notes (Signed)
Dr. Rick Duff notified of patient blood glucose of 65. Patient alert, oriented x 4.  Given Graham crackers and peanut butter snack x 4 at patient request. Will recheck blood glucose.

## 2017-11-04 NOTE — Anesthesia Postprocedure Evaluation (Signed)
Anesthesia Post Note  Patient: Dale Franklin  Procedure(s) Performed: COLONOSCOPY WITH PROPOFOL (N/A ) POLYPECTOMY  Patient location during evaluation: PACU Anesthesia Type: MAC Level of consciousness: awake and patient cooperative Pain management: pain level controlled Vital Signs Assessment: post-procedure vital signs reviewed and stable Respiratory status: spontaneous breathing, nonlabored ventilation and respiratory function stable Cardiovascular status: blood pressure returned to baseline Postop Assessment: no apparent nausea or vomiting Anesthetic complications: no     Last Vitals:  Vitals:   11/04/17 0937  BP: 125/87  Pulse: (!) 102  Resp: 16  Temp: 36.7 C  SpO2: 98%    Last Pain:  Vitals:   11/04/17 1020  TempSrc:   PainSc: 0-No pain                 Corrin Sieling,Joram J

## 2017-11-04 NOTE — Progress Notes (Signed)
Patient had a blood sugar of 69. Advised by Dr. Rick Duff to give patient a regular soda. Will have blood sugar rechecked once in post-op.

## 2017-11-04 NOTE — Anesthesia Preprocedure Evaluation (Signed)
Anesthesia Evaluation  Patient identified by MRN, date of birth, ID band Patient awake    Reviewed: Allergy & Precautions, H&P , NPO status , Patient's Chart, lab work & pertinent test results, reviewed documented beta blocker date and time   Airway Mallampati: II  TM Distance: >3 FB Neck ROM: full   Comment: TMJ Dental no notable dental hx.    Pulmonary neg pulmonary ROS, COPD, Current Smoker,    Pulmonary exam normal breath sounds clear to auscultation       Cardiovascular Exercise Tolerance: Good hypertension, negative cardio ROS  + dysrhythmias Atrial Fibrillation  Rhythm:regular Rate:Normal     Neuro/Psych PSYCHIATRIC DISORDERS Depression negative neurological ROS  negative psych ROS   GI/Hepatic negative GI ROS, Neg liver ROS,   Endo/Other  negative endocrine ROSdiabetes  Renal/GU negative Renal ROS  negative genitourinary   Musculoskeletal   Abdominal   Peds  Hematology negative hematology ROS (+)   Anesthesia Other Findings   Reproductive/Obstetrics negative OB ROS                             Anesthesia Physical Anesthesia Plan  ASA: III  Anesthesia Plan: MAC   Post-op Pain Management:    Induction:   PONV Risk Score and Plan:   Airway Management Planned:   Additional Equipment:   Intra-op Plan:   Post-operative Plan:   Informed Consent: I have reviewed the patients History and Physical, chart, labs and discussed the procedure including the risks, benefits and alternatives for the proposed anesthesia with the patient or authorized representative who has indicated his/her understanding and acceptance.   Dental Advisory Given  Plan Discussed with: CRNA  Anesthesia Plan Comments:         Anesthesia Quick Evaluation

## 2017-11-09 ENCOUNTER — Encounter (HOSPITAL_COMMUNITY): Payer: Self-pay | Admitting: Internal Medicine

## 2017-11-15 ENCOUNTER — Ambulatory Visit: Payer: Medicare Other | Admitting: Urology

## 2017-11-15 DIAGNOSIS — Z79899 Other long term (current) drug therapy: Secondary | ICD-10-CM | POA: Diagnosis not present

## 2017-11-15 DIAGNOSIS — R9721 Rising PSA following treatment for malignant neoplasm of prostate: Secondary | ICD-10-CM | POA: Diagnosis not present

## 2017-11-15 DIAGNOSIS — C61 Malignant neoplasm of prostate: Secondary | ICD-10-CM

## 2017-11-18 ENCOUNTER — Telehealth (INDEPENDENT_AMBULATORY_CARE_PROVIDER_SITE_OTHER): Payer: Self-pay | Admitting: Internal Medicine

## 2017-11-18 NOTE — Telephone Encounter (Signed)
Dale Franklin, if he is having bloody diarrhea, I think u probably need to let Dr. Laural Golden know just in case there was a perforation.

## 2017-11-18 NOTE — Telephone Encounter (Signed)
[name] -please advise.

## 2017-11-18 NOTE — Telephone Encounter (Signed)
Patient left message stating he had a colonoscopy on 11-04-17 - patient stated he is having bloody diarrhea - please advise  Ph# [phone]

## 2017-11-19 ENCOUNTER — Encounter (HOSPITAL_COMMUNITY): Payer: Self-pay | Admitting: Emergency Medicine

## 2017-11-19 ENCOUNTER — Other Ambulatory Visit: Payer: Self-pay

## 2017-11-19 ENCOUNTER — Observation Stay (HOSPITAL_COMMUNITY)
Admission: EM | Admit: 2017-11-19 | Discharge: 2017-11-20 | Disposition: A | Discharge status: Home / Self Care | Payer: Medicare Other | Attending: Internal Medicine | Admitting: Internal Medicine

## 2017-11-19 DIAGNOSIS — Z7901 Long term (current) use of anticoagulants: Secondary | ICD-10-CM | POA: Diagnosis not present

## 2017-11-19 DIAGNOSIS — K922 Gastrointestinal hemorrhage, unspecified: Secondary | ICD-10-CM | POA: Diagnosis not present

## 2017-11-19 DIAGNOSIS — K920 Hematemesis: Principal | ICD-10-CM | POA: Insufficient documentation

## 2017-11-19 DIAGNOSIS — C61 Malignant neoplasm of prostate: Secondary | ICD-10-CM | POA: Diagnosis not present

## 2017-11-19 DIAGNOSIS — I482 Chronic atrial fibrillation, unspecified: Secondary | ICD-10-CM | POA: Diagnosis not present

## 2017-11-19 DIAGNOSIS — Z7984 Long term (current) use of oral hypoglycemic drugs: Secondary | ICD-10-CM | POA: Diagnosis not present

## 2017-11-19 DIAGNOSIS — E119 Type 2 diabetes mellitus without complications: Secondary | ICD-10-CM

## 2017-11-19 DIAGNOSIS — K625 Hemorrhage of anus and rectum: Secondary | ICD-10-CM

## 2017-11-19 DIAGNOSIS — E662 Morbid (severe) obesity with alveolar hypoventilation: Secondary | ICD-10-CM | POA: Insufficient documentation

## 2017-11-19 DIAGNOSIS — J449 Chronic obstructive pulmonary disease, unspecified: Secondary | ICD-10-CM | POA: Diagnosis not present

## 2017-11-19 DIAGNOSIS — F329 Major depressive disorder, single episode, unspecified: Secondary | ICD-10-CM | POA: Diagnosis not present

## 2017-11-19 DIAGNOSIS — F32A Depression, unspecified: Secondary | ICD-10-CM | POA: Diagnosis present

## 2017-11-19 DIAGNOSIS — F17210 Nicotine dependence, cigarettes, uncomplicated: Secondary | ICD-10-CM | POA: Diagnosis not present

## 2017-11-19 DIAGNOSIS — Z79899 Other long term (current) drug therapy: Secondary | ICD-10-CM | POA: Insufficient documentation

## 2017-11-19 DIAGNOSIS — I10 Essential (primary) hypertension: Secondary | ICD-10-CM | POA: Diagnosis not present

## 2017-11-19 LAB — COMPREHENSIVE METABOLIC PANEL
ALT: 27 U/L (ref 0–44)
AST: 20 U/L (ref 15–41)
Albumin: 3.5 g/dL (ref 3.5–5.0)
Alkaline Phosphatase: 103 U/L (ref 38–126)
Anion gap: 4 — ABNORMAL LOW (ref 5–15)
BILIRUBIN TOTAL: 0.6 mg/dL (ref 0.3–1.2)
BUN: 30 mg/dL — AB (ref 6–20)
CHLORIDE: 105 mmol/L (ref 98–111)
CO2: 29 mmol/L (ref 22–32)
CREATININE: 0.97 mg/dL (ref 0.61–1.24)
Calcium: 8.8 mg/dL — ABNORMAL LOW (ref 8.9–10.3)
GFR calc Af Amer: >60 mL/min (ref >60)
GFR calc non Af Amer: >60 mL/min (ref >60)
GLUCOSE: 111 mg/dL — AB (ref 70–99)
Potassium: 4.1 mmol/L (ref 3.5–5.1)
Sodium: 138 mmol/L (ref 135–145)
Total Protein: 6.4 g/dL — ABNORMAL LOW (ref 6.5–8.1)

## 2017-11-19 LAB — PROTIME-INR
INR: 3.1
Prothrombin Time: 31.7 seconds — ABNORMAL HIGH (ref 11.4–15.2)

## 2017-11-19 LAB — TYPE AND SCREEN
ABO/RH(D): O POS
Antibody Screen: NEGATIVE

## 2017-11-19 LAB — CBC
HEMATOCRIT: 34.6 % — AB (ref 39.0–52.0)
Hemoglobin: 11 g/dL — ABNORMAL LOW (ref 13.0–17.0)
MCH: 27.6 pg (ref 26.0–34.0)
MCHC: 31.8 g/dL (ref 30.0–36.0)
MCV: 86.7 fL (ref 78.0–100.0)
PLATELETS: 135 10*3/uL — AB (ref 150–400)
RBC: 3.99 MIL/uL — AB (ref 4.22–5.81)
RDW: 15.1 % (ref 11.5–15.5)
WBC: 7.1 10*3/uL (ref 4.0–10.5)

## 2017-11-19 LAB — GLUCOSE, CAPILLARY: GLUCOSE-CAPILLARY: 96 mg/dL (ref 70–99)

## 2017-11-19 MED ORDER — SODIUM CHLORIDE 0.9 % IV BOLUS: 500.0000 mL | Freq: Once | INTRAVENOUS | Status: DC

## 2017-11-19 MED ORDER — SODIUM CHLORIDE 0.9% FLUSH
3.0000 mL | Freq: Two times a day (BID) | INTRAVENOUS | Status: DC
  Administered 2017-11-19: 3 mL via INTRAVENOUS

## 2017-11-19 MED ORDER — SIMVASTATIN 10 MG PO TABS: 10.0000 mg | ORAL_TABLET | Freq: Every day | ORAL | Status: DC

## 2017-11-19 MED ORDER — DULOXETINE HCL 30 MG PO CPEP
90.0000 mg | ORAL_CAPSULE | Freq: Every day | ORAL | Status: DC
  Administered 2017-11-20: 90 mg via ORAL
  Filled 2017-11-19: qty 3

## 2017-11-19 MED ORDER — ONDANSETRON HCL 4 MG/2ML IJ SOLN: 4.0000 mg | Freq: Four times a day (QID) | INTRAMUSCULAR | Status: DC | PRN

## 2017-11-19 MED ORDER — TRAZODONE HCL 50 MG PO TABS
50.0000 mg | ORAL_TABLET | Freq: Every day | ORAL | Status: DC
  Administered 2017-11-19: 50 mg via ORAL
  Filled 2017-11-19: qty 1

## 2017-11-19 MED ORDER — ONDANSETRON HCL 4 MG PO TABS: 4.0000 mg | ORAL_TABLET | Freq: Four times a day (QID) | ORAL | Status: DC | PRN

## 2017-11-19 MED ORDER — SODIUM CHLORIDE 0.9 % IV SOLN
INTRAVENOUS | Status: AC
  Administered 2017-11-19: 22:00:00 via INTRAVENOUS

## 2017-11-19 MED ORDER — ALBUTEROL SULFATE (2.5 MG/3ML) 0.083% IN NEBU: 3.0000 mL | INHALATION_SOLUTION | Freq: Four times a day (QID) | RESPIRATORY_TRACT | Status: DC | PRN

## 2017-11-19 MED ORDER — MORPHINE SULFATE 15 MG PO TABS
30.0000 mg | ORAL_TABLET | Freq: Three times a day (TID) | ORAL | Status: DC | PRN
  Administered 2017-11-19 – 2017-11-20 (×2): 30 mg via ORAL
  Filled 2017-11-19 (×2): qty 2

## 2017-11-19 MED ORDER — LORAZEPAM 0.5 MG PO TABS
0.5000 mg | ORAL_TABLET | Freq: Four times a day (QID) | ORAL | Status: DC | PRN
  Administered 2017-11-19: 1 mg via ORAL
  Administered 2017-11-20: 0.5 mg via ORAL
  Filled 2017-11-19: qty 2
  Filled 2017-11-19: qty 1

## 2017-11-19 MED ORDER — OXYBUTYNIN CHLORIDE 5 MG PO TABS
5.0000 mg | ORAL_TABLET | Freq: Three times a day (TID) | ORAL | Status: DC
  Administered 2017-11-19 – 2017-11-20 (×2): 5 mg via ORAL
  Filled 2017-11-19 (×2): qty 1

## 2017-11-19 MED ORDER — ACETAMINOPHEN 325 MG PO TABS: 650.0000 mg | ORAL_TABLET | Freq: Four times a day (QID) | ORAL | Status: DC | PRN

## 2017-11-19 MED ORDER — ACETAMINOPHEN 650 MG RE SUPP: 650.0000 mg | Freq: Four times a day (QID) | RECTAL | Status: DC | PRN

## 2017-11-19 MED ORDER — INSULIN ASPART 100 UNIT/ML ~~LOC~~ SOLN: 0.0000 [IU] | SUBCUTANEOUS | Status: DC

## 2017-11-19 NOTE — H&P (Signed)
History and Physical    Dale Franklin:[id number] DOB: October 07, 1960 DOA: 11/19/2017  PCP: Karsten Ro, DO   Patient coming from: Home   Chief Complaint: Rectal bleeding   HPI: Dale Franklin is a 57 y.o. male with medical history significant for type 2 diabetes mellitus, COPD, prostate cancer status post radiation, hypertension, and atrial fibrillation on Coumadin, now presenting to the emergency department with 1 day of rectal bleeding.  The patient underwent colonoscopy on 11/04/2017 with polypectomy x4 (tubular adenomas on pathology report), and continued to do well until yesterday when he noted some red blood per rectum.  He reports that stool was loose, there was mild discomfort in the left lower quadrant but no real abdominal pain.  He denies any associated nausea or vomiting.  He has gone on to have a couple more episodes of red blood per rectum.  Denies any recent shortness of breath, cough, chest pain, fevers, or chills.  Developed some mild lightheadedness upon standing and presents to the ED for evaluation.  He did not take his warfarin today.  ED Course: Upon arrival to the ED, patient is found to be afebrile, saturating well on room air, blood pressure 105/69 and normal heart rate.  Chemistry panel features a BUN of 30, similar to prior.  CBC is notable for a normocytic anemia with hemoglobin 11.0, down from 13.9 in August.  INR is 3.10.  Patient was given 500 cc normal saline in the ED.  Gastroenterology was consulted by the ED physician and recommended hospitalist admission with clear liquid diet and bleeding scan if patient has bleeding episode in the hospital.  Review of Systems:  All other systems reviewed and apart from HPI, are negative.  Past Medical History:  Diagnosis Date  . Arthritis   . Bowel obstruction ([place])   . COPD (chronic obstructive pulmonary disease) ([place])   . Depression   . Diabetes mellitus without complication ([place])   . Dysrhythmia    A flutter  .  Hypertension   . Prostate cancer [facility])     Past Surgical History:  Procedure Laterality Date  . COLONOSCOPY WITH PROPOFOL N/A 11/04/2017   Procedure: COLONOSCOPY WITH PROPOFOL;  Surgeon: Rogene Houston, MD;  [facility];  Service: Endoscopy;  Laterality: N/A;  2:25  . GOLD SEED IMPLANT N/A 05/31/2016   Procedure: GOLD SEED IMPLANT;  Surgeon: Franchot Gallo, MD;  Location: [facility];  Service: Urology;  Laterality: N/A;  . HAND SURGERY Right   . HERNIA REPAIR    . POLYPECTOMY  11/04/2017   Procedure: POLYPECTOMY;  Surgeon: Rogene Houston, MD;  [facility];  Service: Endoscopy;;  cecal polyp (CSx1), transverse colon (CSx2),recto-sigmoid(CSx1)  . TRANSURETHRAL RESECTION OF PROSTATE N/A 05/31/2016   Procedure: TRANSURETHRAL RESECTION OF THE PROSTATE (TURP);  Surgeon: Franchot Gallo, MD;  Location: [facility];  Service: Urology;  Laterality: N/A;  . WRIST SURGERY Right      reports that he has been smoking cigarettes. He has been smoking about 0.50 packs per day. He has never used smokeless tobacco. He reports that he drinks alcohol. He reports that he does not use drugs.  No Known Allergies  Family History  Problem Relation Age of Onset  . Diabetes Mother   . Cancer Father   . Cancer Paternal Uncle        prostate     Prior to Admission medications   Medication Sig Start Date End Date Taking? Authorizing Provider  albuterol (PROVENTIL HFA;VENTOLIN  HFA) 108 (90 Base) MCG/ACT inhaler Inhale 1-2 puffs into the lungs every 6 (six) hours as needed for wheezing or shortness of breath.   Yes [provider]  DULoxetine (CYMBALTA) 30 MG capsule Take 90 mg by mouth daily.    Yes [provider]  glipiZIDE (GLUCOTROL) 10 MG tablet Take 10 mg by mouth 2 (two) times daily.   Yes [provider]  leuprolide (LUPRON) 11.25 MG injection Inject 11.25 mg into the muscle every 4 (four) months.    Yes [provider]    lisinopril-hydrochlorothiazide (PRINZIDE,ZESTORETIC) 10-12.5 MG tablet Take 1 tablet by mouth daily.    Yes [provider]  metFORMIN (GLUCOPHAGE-XR) 500 MG 24 hr tablet Take 500 mg by mouth 2 (two) times daily.    Yes [provider]  metoprolol succinate (TOPROL-XL) 25 MG 24 hr tablet Take 25 mg by mouth 2 (two) times daily.    Yes [provider]  morphine (MSIR) 30 MG tablet Take 30 mg by mouth every 8 (eight) hours as needed for pain. 10/13/17  Yes [provider]  oxybutynin (DITROPAN) 5 MG tablet Take 5 mg by mouth 3 (three) times daily.    Yes [provider]  simvastatin (ZOCOR) 10 MG tablet Take 10 mg by mouth at bedtime. 06/30/17  Yes [provider]  traZODone (DESYREL) 50 MG tablet Take 50 mg by mouth at bedtime.   Yes [provider]  warfarin (COUMADIN) 10 MG tablet Take 10 mg by mouth every evening.   Yes [provider]  warfarin (COUMADIN) 2.5 MG tablet Take 2.5 mg by mouth at bedtime.  07/05/16  Yes [provider]    Physical Exam: Vitals:   11/19/17 1809 11/19/17 1811 11/19/17 1939  BP: 110/73  105/69  Pulse: 88  86  Resp: 18  18  Temp: (!) 97.5 F (36.4 C)    TempSrc: Oral    SpO2: 99%  100%  Weight:  (!) 137 kg   Height:  6\' 2"  (1.88 m)      Constitutional: NAD, calm  Eyes: PERTLA, lids and conjunctivae normal ENMT: Mucous membranes are moist. Posterior pharynx clear of any exudate or lesions.   Neck: normal, supple, no masses, no thyromegaly Respiratory: clear to auscultation bilaterally, no wheezing, no crackles. Normal respiratory effort.    Cardiovascular: S1 & S2 heard, regular rate and rhythm. No extremity edema.   Abdomen: No distension, soft, non-tender. Bowel sounds active.  Musculoskeletal: no clubbing / cyanosis. No joint deformity upper and lower extremities.    Skin: no significant rashes, lesions, ulcers. Warm, dry, well-perfused. Neurologic: CN 2-12 grossly intact.  Sensation intact. Strength 5/5 in all 4 limbs.  Psychiatric: Alert and oriented x 3. Calm, cooperative.    Labs on Admission: I have personally reviewed following labs and imaging studies  CBC: Recent Labs  Lab 11/19/17 1852  WBC 7.1  HGB 11.0*  HCT 34.6*  MCV 86.7  PLT 341*   Basic Metabolic Panel: Recent Labs  Lab 11/19/17 1852  NA 138  K 4.1  CL 105  CO2 29  GLUCOSE 111*  BUN 30*  CREATININE 0.97  CALCIUM 8.8*   GFR: Estimated Creatinine Clearance: 123.7 mL/min (by C-G formula based on SCr of 0.97 mg/dL). Liver Function Tests: Recent Labs  Lab 11/19/17 1852  AST 20  ALT 27  ALKPHOS 103  BILITOT 0.6  PROT 6.4*  ALBUMIN 3.5   No results for input(s): LIPASE, AMYLASE in the last 168  hours. No results for input(s): AMMONIA in the last 168 hours. Coagulation Profile: Recent Labs  Lab 11/19/17 1852  INR 3.10   Cardiac Enzymes: No results for input(s): CKTOTAL, CKMB, CKMBINDEX, TROPONINI in the last 168 hours. BNP (last 3 results) No results for input(s): PROBNP in the last 8760 hours. HbA1C: No results for input(s): HGBA1C in the last 72 hours. CBG: No results for input(s): GLUCAP in the last 168 hours. Lipid Profile: No results for input(s): CHOL, HDL, LDLCALC, TRIG, CHOLHDL, LDLDIRECT in the last 72 hours. Thyroid Function Tests: No results for input(s): TSH, T4TOTAL, FREET4, T3FREE, THYROIDAB in the last 72 hours. Anemia Panel: No results for input(s): VITAMINB12, FOLATE, FERRITIN, TIBC, IRON, RETICCTPCT in the last 72 hours. Urine analysis:    Component Value Date/Time   COLORURINE YELLOW 07/21/2017 2230   APPEARANCEUR HAZY (A) 07/21/2017 2230   LABSPEC 1.031 (H) 07/21/2017 2230   PHURINE 5.0 07/21/2017 2230   GLUCOSEU NEGATIVE 07/21/2017 2230   HGBUR NEGATIVE 07/21/2017 2230   BILIRUBINUR NEGATIVE 07/21/2017 2230   KETONESUR 5 (A) 07/21/2017 2230   PROTEINUR NEGATIVE 07/21/2017 2230   NITRITE NEGATIVE 07/21/2017 2230   LEUKOCYTESUR  NEGATIVE 07/21/2017 2230   Sepsis Labs: @LABRCNTIP (procalcitonin:4,lacticidven:4) )No results found for this or any previous visit (from the past 240 hour(s)).   Radiological Exams on Admission: No results found.  EKG: Not performed.   Assessment/Plan   1. Rectal bleeding  - Presents with bright red rectal bleeding x1 day  - Hemodynamically stable in ED  - Hgb is 11.0 on admission, down from 13.9 in August  - Colonoscopy with polypectomy x4 (tubular adenomas) on 11/04/17  - No upper abd discomfort or N/V, BUN is stable  - Likely lower source; no diverticula or AVM reported on colonoscopy; possibly a delayed post-polypectomy bleed  - GI is consulting and much appreciated, recommending clear liquid diet and consideration of bleeding scan if bleeds in hospital  - Type and screen, continue IVF hydration, follow H&H    2. Atrial fibrillation  - Rate-controlled on admission  - CHADS-VASc may be only 2 (HTN, DM) - INR is 3.1, warfarin held in light of GIB  - Plan to continue holding warfarin, reverse if bleeding worsens    3. Type II DM  - A1c was 6.5% last year  - Managed at home with glipizide and metformin, held on admission  - Check CBG's and use a low-intensity SSI with Novolog as needed while in hospital   4. Hypertension  - BP low-normal in ED and with acute GIB, will hold antihypertensives initially    5. Prostate cancer  - Status-post RT, follows with onc and urology and managed with Lupron injections    6. Depression  - Continue Cymbalta and trazodone    DVT prophylaxis: warfarin prior to admission, held  Code Status: Full  Family Communication: Discussed with patient   Consults called: GI  Admission status: Observation     Vianne Bulls, MD [facility] Pager [phone]  If 7PM-7AM, please contact night-coverage [email or web address] Password [email or web address]  11/19/2017, 8:21 PM

## 2017-11-19 NOTE — ED Notes (Signed)
Patient has agreed to be admitted

## 2017-11-19 NOTE — ED Notes (Signed)
Please note patient taken up by RN. IV infusion was placed on hold due to day time nurse unable to get IV access. Patient would not stay on cardiac monitor and was standing in the hall. Tech was busy so RN transported patient in a wheel chair. Patient was very agitated and attempted to place patient on telemetry monitor on 300 floor. Patient refused to let me do so and refused to get in bed. 300 RN arrived in room for patient care.

## 2017-11-19 NOTE — ED Notes (Signed)
Patient states at this time he is uncertain if he wants to be admitted. Patient is waiting to talk to the hospitalist.

## 2017-11-19 NOTE — ED Provider Notes (Signed)
[facility] EMERGENCY DEPARTMENT Provider Note   CSN: [id number] Arrival date & time: 11/19/17  1724     History   Chief Complaint Chief Complaint  Patient presents with  . Rectal Bleeding    HPI Dale Franklin is a 57 y.o. male.  Patient c/o rectal bleeding onset last night. Bright red blood, mild to moderate. Had a few loose stools prior to onset bleeding. No abd pain. No other abnormal bruising or bleeding - is on anticoagulation therapy for afib. Had colonoscopy w removal of a few benign polpys and ext hemorrhoids approximately 2 weeks ago. Denies hx pud/ugib. No faintness or dizziness. No abd pain.   The history is provided by the patient.  Rectal Bleeding  Associated symptoms: no abdominal pain, no fever and no vomiting     Past Medical History:  Diagnosis Date  . Arthritis   . Bowel obstruction ([place])   . COPD (chronic obstructive pulmonary disease) ([place])   . Depression   . Diabetes mellitus without complication ([place])   . Dysrhythmia    A flutter  . Hypertension   . Prostate cancer [facility])     Patient Active Problem List   Diagnosis Date Noted  . Rectal bleeding 10/18/2017  . Small bowel obstruction due to adhesions ([place]) 10/01/2017  . Encounter for nasogastric (NG) tube placement   . Obesity (BMI 30-39.9)   . Atrial fibrillation, chronic   . Prolonged QT interval   . Bedbug bite   . SBO (small bowel obstruction) ([place]) 07/21/2017  . Tobacco abuse 07/21/2017  . Acute metabolic encephalopathy 63/87/5643  . COPD (chronic obstructive pulmonary disease) ([place])   . Depression   . Diabetes mellitus without complication ([place])   . Hypertension   . Bladder outlet obstruction 06/01/2016  . Malignant neoplasm of prostate ([place]) 05/31/2016  . Prostate cancer ([place]) 05/11/2016    Past Surgical History:  Procedure Laterality Date  . COLONOSCOPY WITH PROPOFOL N/A 11/04/2017   Procedure: COLONOSCOPY WITH PROPOFOL;  Surgeon: Rogene Houston, MD;  [facility];   Service: Endoscopy;  Laterality: N/A;  2:25  . GOLD SEED IMPLANT N/A 05/31/2016   Procedure: GOLD SEED IMPLANT;  Surgeon: Franchot Gallo, MD;  Location: [facility];  Service: Urology;  Laterality: N/A;  . HAND SURGERY Right   . HERNIA REPAIR    . POLYPECTOMY  11/04/2017   Procedure: POLYPECTOMY;  Surgeon: Rogene Houston, MD;  [facility];  Service: Endoscopy;;  cecal polyp (CSx1), transverse colon (CSx2),recto-sigmoid(CSx1)  . TRANSURETHRAL RESECTION OF PROSTATE N/A 05/31/2016   Procedure: TRANSURETHRAL RESECTION OF THE PROSTATE (TURP);  Surgeon: Franchot Gallo, MD;  Location[facility];  Service: Urology;  Laterality: N/A;  . WRIST SURGERY Right         Home Medications    Prior to Admission medications   Medication Sig Start Date End Date Taking? Authorizing Provider  albuterol (PROVENTIL HFA;VENTOLIN HFA) 108 (90 Base) MCG/ACT inhaler Inhale 1-2 puffs into the lungs every 6 (six) hours as needed for wheezing or shortness of breath.   Yes [provider]  DULoxetine (CYMBALTA) 30 MG capsule Take 90 mg by mouth daily.    Yes [provider]  glipiZIDE (GLUCOTROL) 10 MG tablet Take 10 mg by mouth 2 (two) times daily.   Yes [provider]  leuprolide (LUPRON) 11.25 MG injection Inject 11.25 mg into the muscle every 4 (four) months.    Yes [provider]  lisinopril-hydrochlorothiazide (PRINZIDE,ZESTORETIC) 10-12.5 MG tablet Take 1  tablet by mouth daily.    Yes [provider]  metFORMIN (GLUCOPHAGE-XR) 500 MG 24 hr tablet Take 500 mg by mouth 2 (two) times daily.    Yes [provider]  metoprolol succinate (TOPROL-XL) 25 MG 24 hr tablet Take 25 mg by mouth 2 (two) times daily.    Yes [provider]  morphine (MSIR) 30 MG tablet Take 30 mg by mouth every 8 (eight) hours as needed for pain. 10/13/17  Yes [provider]  oxybutynin (DITROPAN) 5 MG tablet Take 5 mg by mouth 3 (three) times daily.    Yes  [provider]  simvastatin (ZOCOR) 10 MG tablet Take 10 mg by mouth at bedtime. 06/30/17  Yes [provider]  traZODone (DESYREL) 50 MG tablet Take 50 mg by mouth at bedtime.   Yes [provider]  warfarin (COUMADIN) 10 MG tablet Take 10 mg by mouth every evening.   Yes [provider]  warfarin (COUMADIN) 2.5 MG tablet Take 2.5 mg by mouth at bedtime.  07/05/16  Yes [provider]    Family History Family History  Problem Relation Age of Onset  . Diabetes Mother   . Cancer Father   . Cancer Paternal Uncle        prostate    Social History Social History   Tobacco Use  . Smoking status: Current Every Day Smoker    Packs/day: 0.50    Types: Cigarettes  . Smokeless tobacco: Never Used  Substance Use Topics  . Alcohol use: Yes    Comment: occasionally  . Drug use: No     Allergies   Patient has no known allergies.   Review of Systems Review of Systems  Constitutional: Negative for fever.  HENT: Negative for sore throat.   Eyes: Negative for redness.  Respiratory: Negative for shortness of breath.   Cardiovascular: Negative for chest pain.  Gastrointestinal: Positive for blood in stool and hematochezia. Negative for abdominal pain and vomiting.  Genitourinary: Negative for flank pain.  Musculoskeletal: Negative for back pain.  Skin: Negative for rash.  Neurological: Negative for headaches.  Hematological:       +anticoag therapy.   Psychiatric/Behavioral: Negative for confusion.     Physical Exam Updated Vital Signs BP 110/73 (BP Location: Left Arm)   Pulse 88   Temp (!) 97.5 F (36.4 C) (Oral)   Resp 18   Ht 1.88 m (6\' 2" )   Wt (!) 137 kg   SpO2 99%   BMI 38.78 kg/m   Physical Exam  Constitutional: He appears well-developed and well-nourished.  HENT:  Mouth/Throat: Oropharynx is clear and moist.  Eyes: Conjunctivae are normal.  Neck: Neck supple. No tracheal deviation present.  Cardiovascular: Normal  rate, regular rhythm, normal heart sounds and intact distal pulses. Exam reveals no gallop and no friction rub.  No murmur heard. Pulmonary/Chest: Effort normal and breath sounds normal. No accessory muscle usage. No respiratory distress.  Abdominal: Soft. Bowel sounds are normal. He exhibits no distension. There is no tenderness.  Genitourinary:  Genitourinary Comments: Scant amount loose/watery red blood on rectal. No mass. No melena.   Musculoskeletal: He exhibits no edema.  Neurological: He is alert.  Skin: Skin is warm and dry.  Psychiatric: He has a normal mood and affect.  Nursing note and vitals reviewed.    ED Treatments / Results  Labs (all labs ordered are listed, but only abnormal results are displayed) Results for orders placed or performed during the hospital  encounter of 11/19/17  CBC  Result Value Ref Range   WBC 7.1 4.0 - 10.5 K/uL   RBC 3.99 (L) 4.22 - 5.81 MIL/uL   Hemoglobin 11.0 (L) 13.0 - 17.0 g/dL   HCT 34.6 (L) 39.0 - 52.0 %   MCV 86.7 78.0 - 100.0 fL   MCH 27.6 26.0 - 34.0 pg   MCHC 31.8 30.0 - 36.0 g/dL   RDW 15.1 11.5 - 15.5 %   Platelets 135 (L) 150 - 400 K/uL  Comprehensive metabolic panel  Result Value Ref Range   Sodium 138 135 - 145 mmol/L   Potassium 4.1 3.5 - 5.1 mmol/L   Chloride 105 98 - 111 mmol/L   CO2 29 22 - 32 mmol/L   Glucose, Bld 111 (H) 70 - 99 mg/dL   BUN 30 (H) 6 - 20 mg/dL   Creatinine, Ser 0.97 0.61 - 1.24 mg/dL   Calcium 8.8 (L) 8.9 - 10.3 mg/dL   Total Protein 6.4 (L) 6.5 - 8.1 g/dL   Albumin 3.5 3.5 - 5.0 g/dL   AST 20 15 - 41 U/L   ALT 27 0 - 44 U/L   Alkaline Phosphatase 103 38 - 126 U/L   Total Bilirubin 0.6 0.3 - 1.2 mg/dL   GFR calc non Af Amer >60 >60 mL/min   GFR calc Af Amer >60 >60 mL/min   Anion gap 4 (L) 5 - 15  Protime-INR  Result Value Ref Range   Prothrombin Time 31.7 (H) 11.4 - 15.2 seconds   INR 3.10    EKG None  Radiology No results found.  Procedures Procedures (including critical care  time)  Medications Ordered in ED Medications  sodium chloride 0.9 % bolus 500 mL (has no administration in time range)     Initial Impression / Assessment and Plan / ED Course  I have reviewed the triage vital signs and the nursing notes.  Pertinent labs & imaging results that were available during my care of the patient were reviewed by me and considered in my medical decision making (see chart for details).   labs sent.   Reviewed nursing notes and prior charts for additional history.   Pt called his GI doc last night - was told to hold coumadin and f/u Monday. As had another episode today, came to ED.   Labs reviewed - hgb is lower than 2 months prior result, but similar to 3 months ago results. Pt not orthostatic, but is anticoagulated - will discuss with his GI doctor.   Discussed w Dr Laural Golden - he indicates to admit to hospitalists, keep on clear liquid diet, indicates they can check bleeding scan.    Final Clinical Impressions(s) / ED Diagnoses   Final diagnoses:  None    ED Discharge Orders    None       Lajean Saver, MD 11/19/17 2001

## 2017-11-19 NOTE — ED Triage Notes (Signed)
Bright red blood in stools since last night.  Pt has BM in ED with bright red blood.

## 2017-11-20 DIAGNOSIS — K922 Gastrointestinal hemorrhage, unspecified: Secondary | ICD-10-CM

## 2017-11-20 DIAGNOSIS — F329 Major depressive disorder, single episode, unspecified: Secondary | ICD-10-CM | POA: Diagnosis not present

## 2017-11-20 DIAGNOSIS — E119 Type 2 diabetes mellitus without complications: Secondary | ICD-10-CM | POA: Diagnosis not present

## 2017-11-20 DIAGNOSIS — I10 Essential (primary) hypertension: Secondary | ICD-10-CM | POA: Diagnosis not present

## 2017-11-20 LAB — HEMOGLOBIN AND HEMATOCRIT, BLOOD
HEMATOCRIT: 37.6 % — AB (ref 39.0–52.0)
HEMATOCRIT: 36.4 % — AB (ref 39.0–52.0)
Hemoglobin: 11.8 g/dL — ABNORMAL LOW (ref 13.0–17.0)
Hemoglobin: 11.7 g/dL — ABNORMAL LOW (ref 13.0–17.0)

## 2017-11-20 LAB — BASIC METABOLIC PANEL
Anion gap: 4 — ABNORMAL LOW (ref 5–15)
BUN: 25 mg/dL — AB (ref 6–20)
CO2: 29 mmol/L (ref 22–32)
CREATININE: 0.85 mg/dL (ref 0.61–1.24)
Calcium: 8.7 mg/dL — ABNORMAL LOW (ref 8.9–10.3)
Chloride: 104 mmol/L (ref 98–111)
GFR calc Af Amer: >60 mL/min (ref >60)
GFR calc non Af Amer: >60 mL/min (ref >60)
GLUCOSE: 106 mg/dL — AB (ref 70–99)
POTASSIUM: 4.6 mmol/L (ref 3.5–5.1)
Sodium: 137 mmol/L (ref 135–145)

## 2017-11-20 LAB — GLUCOSE, CAPILLARY
GLUCOSE-CAPILLARY: 106 mg/dL — AB (ref 70–99)
GLUCOSE-CAPILLARY: 114 mg/dL — AB (ref 70–99)
GLUCOSE-CAPILLARY: 80 mg/dL (ref 70–99)
Glucose-Capillary: 92 mg/dL (ref 70–99)

## 2017-11-20 LAB — PROTIME-INR
INR: 2.85
Prothrombin Time: 29.7 seconds — ABNORMAL HIGH (ref 11.4–15.2)

## 2017-11-20 MED ORDER — HYDROCORTISONE ACETATE 25 MG RE SUPP: 25.0000 mg | Freq: Every day | RECTAL | 0 refills | Status: AC

## 2017-11-20 MED ORDER — WARFARIN SODIUM 10 MG PO TABS: 10.0000 mg | ORAL_TABLET | Freq: Every evening | ORAL | Status: DC

## 2017-11-20 MED ORDER — DOCUSATE SODIUM 100 MG PO CAPS: 200.0000 mg | ORAL_CAPSULE | Freq: Every day | ORAL | 1 refills | Status: AC

## 2017-11-20 MED ORDER — WARFARIN SODIUM 2.5 MG PO TABS: 2.5000 mg | ORAL_TABLET | Freq: Every day | ORAL | Status: DC

## 2017-11-20 MED ORDER — PSYLLIUM 58.6 % PO POWD: 2.0000 | Freq: Every day | ORAL | 3 refills | Status: AC

## 2017-11-20 NOTE — Consult Note (Signed)
Referring Provider: Barton Dubois, MD  primary Care Physician: Rory Percy, MD  primary Gastroenterologist:  Dr. Laural Golden  Reason for Consultation:    Rectal bleeding.  HPI:   Patient is 57 year old Caucasian male who presented to emergency room because of recurrent rectal bleeding.  His hemoglobin was 11 g.  His baseline hemoglobin is around 15 g. Patient was hemodynamically stable.  Since patient is anticoagulated it was decided to bring him in not knowing whether he would stop bleeding spontaneously or continue. It has not passed any more blood per rectum other than small amount of blood on the tissue when he had a bowel movement.  He states he passed formed stool. His hemoglobin on 10/02/2017 was 13.9. He has been having intermittent rectal bleeding for 2 months.  He was seen in our office on 10/13/2017. He underwent colonoscopy on 11/04/2017.  4 small polyps were cold snared.  3 turned out to be tubular adenomas and the fourth polyp was sessile serrated polyp.  He did fine until 06/04/2017 when he noted blood with his bowel movement.  He called office.  I talked with him over the phone and he was doing well.  Was decided to monitor his course. He passed more blood per rectum with his bowel movement yesterday when he decided to come to emergency room.  He now feels fine.  He states he is prone to constipation.  He denies nausea or vomiting.  He has mild LLQ pain which he has had off and on for several weeks if not longer.  He does not take aspirin or other OTC NSAIDs. He has very good appetite.  He generally eats sandwiches.  He is trying to lose weight.  He states his weight has been fluctuating between 301 and 330 pounds  Past Medical History:  Diagnosis Date  . Arthritis   . Bowel obstruction ([place])   . COPD (chronic obstructive pulmonary disease) ([place])   . Depression   . Diabetes mellitus without complication ([place])   . Dysrhythmia    A flutter  . Hypertension   . Prostate cancer [facility])      Past Surgical History:  Procedure Laterality Date  . COLONOSCOPY WITH PROPOFOL N/A 11/04/2017   Procedure: COLONOSCOPY WITH PROPOFOL;  Surgeon: Rogene Houston, MD;  Location: [facility];  Service: Endoscopy;  Laterality: N/A;  2:25  . GOLD SEED IMPLANT N/A 05/31/2016   Procedure: GOLD SEED IMPLANT;  Surgeon: Franchot Gallo, MD;  Location: [facility] ORS;  Service: Urology;  Laterality: N/A;  . HAND SURGERY Right   . HERNIA REPAIR    . POLYPECTOMY  11/04/2017   Procedure: POLYPECTOMY;  Surgeon: Rogene Houston, MD;  Location: [facility];  Service: Endoscopy;;  cecal polyp (CSx1), transverse colon (CSx2),recto-sigmoid(CSx1)  . TRANSURETHRAL RESECTION OF PROSTATE N/A 05/31/2016   Procedure: TRANSURETHRAL RESECTION OF THE PROSTATE (TURP);  Surgeon: Franchot Gallo, MD;  Location: [facility] ORS;  Service: Urology;  Laterality: N/A;  . WRIST SURGERY Right     Prior to Admission medications   Medication Sig Start Date End Date Taking? Authorizing Provider  albuterol (PROVENTIL HFA;VENTOLIN HFA) 108 (90 Base) MCG/ACT inhaler Inhale 1-2 puffs into the lungs every 6 (six) hours as needed for wheezing or shortness of breath.   Yes [provider]  DULoxetine (CYMBALTA) 30 MG capsule Take 90 mg by mouth daily.    Yes [provider]  glipiZIDE (GLUCOTROL) 10 MG tablet Take 10 mg by mouth 2 (two) times daily.  Yes [provider]  leuprolide (LUPRON) 11.25 MG injection Inject 11.25 mg into the muscle every 4 (four) months.    Yes [provider]  lisinopril-hydrochlorothiazide (PRINZIDE,ZESTORETIC) 10-12.5 MG tablet Take 1 tablet by mouth daily.    Yes [provider]  metFORMIN (GLUCOPHAGE-XR) 500 MG 24 hr tablet Take 500 mg by mouth 2 (two) times daily.    Yes [provider]  metoprolol succinate (TOPROL-XL) 25 MG 24 hr tablet Take 25 mg by mouth 2 (two) times daily.    Yes [provider]  morphine (MSIR) 30 MG tablet Take 30 mg by mouth  every 8 (eight) hours as needed for pain. 10/13/17  Yes [provider]  oxybutynin (DITROPAN) 5 MG tablet Take 5 mg by mouth 3 (three) times daily.    Yes [provider]  simvastatin (ZOCOR) 10 MG tablet Take 10 mg by mouth at bedtime. 06/30/17  Yes [provider]  traZODone (DESYREL) 50 MG tablet Take 50 mg by mouth at bedtime.   Yes [provider]  warfarin (COUMADIN) 10 MG tablet Take 10 mg by mouth every evening.   Yes [provider]  warfarin (COUMADIN) 2.5 MG tablet Take 2.5 mg by mouth at bedtime.  07/05/16  Yes [provider]    Current Facility-Administered Medications  Medication Dose Route Frequency Provider Last Rate Last Dose  . acetaminophen (TYLENOL) tablet 650 mg  650 mg Oral Q6H PRN Opyd, Ilene Qua, MD       Or  . acetaminophen (TYLENOL) suppository 650 mg  650 mg Rectal Q6H PRN Opyd, Ilene Qua, MD      . albuterol (PROVENTIL) (2.5 MG/3ML) 0.083% nebulizer solution 3 mL  3 mL Inhalation Q6H PRN Opyd, Ilene Qua, MD      . DULoxetine (CYMBALTA) DR capsule 90 mg  90 mg Oral Daily Opyd, Ilene Qua, MD   90 mg at 11/20/17 0900  . insulin aspart (novoLOG) injection 0-9 Units  0-9 Units Subcutaneous Q4H Opyd, Ilene Qua, MD      . LORazepam (ATIVAN) tablet 0.5-1 mg  0.5-1 mg Oral Q6H PRN Opyd, Ilene Qua, MD   0.5 mg at 11/20/17 0900  . morphine (MSIR) tablet 30 mg  30 mg Oral Q8H PRN Opyd, Ilene Qua, MD   30 mg at 11/20/17 0859  . ondansetron (ZOFRAN) tablet 4 mg  4 mg Oral Q6H PRN Opyd, Ilene Qua, MD       Or  . ondansetron (ZOFRAN) injection 4 mg  4 mg Intravenous Q6H PRN Opyd, Ilene Qua, MD      . oxybutynin (DITROPAN) tablet 5 mg  5 mg Oral TID Opyd, Ilene Qua, MD   5 mg at 11/20/17 0900  . simvastatin (ZOCOR) tablet 10 mg  10 mg Oral q1800 Opyd, Timothy S, MD      . sodium chloride 0.9 % bolus 500 mL  500 mL Intravenous Once Opyd, Ilene Qua, MD   Stopped at 11/19/17 1901  . sodium chloride flush (NS) 0.9 % injection 3 mL  3  mL Intravenous Q12H Opyd, Ilene Qua, MD   3 mL at 11/19/17 2212  . traZODone (DESYREL) tablet 50 mg  50 mg Oral QHS Opyd, Ilene Qua, MD   50 mg at 11/19/17 2212    Allergies as of 11/19/2017  . (No Known Allergies)    Family History  Problem Relation Age of Onset  . Diabetes Mother   . Cancer Father   . Cancer Paternal Uncle  prostate    Social History   Socioeconomic History  . Marital status: Divorced    Spouse name: Not on file  . Number of children: Not on file  . Years of education: Not on file  . Highest education level: Not on file  Occupational History  . Not on file  Social Needs  . Financial resource strain: Not hard at all  . Food insecurity:    Worry: Patient refused    Inability: Patient refused  . Transportation needs:    Medical: Patient refused    Non-medical: Patient refused  Tobacco Use  . Smoking status: Current Every Day Smoker    Packs/day: 0.50    Types: Cigarettes  . Smokeless tobacco: Never Used  Substance and Sexual Activity  . Alcohol use: Yes    Comment: occasionally  . Drug use: No  . Sexual activity: Not on file  Lifestyle  . Physical activity:    Days per week: Patient refused    Minutes per session: Patient refused  . Stress: Not on file  Relationships  . Social connections:    Talks on phone: Patient refused    Gets together: Patient refused    Attends religious service: Patient refused    Active member of club or organization: Patient refused    Attends meetings of clubs or organizations: Patient refused    Relationship status: Patient refused  . Intimate partner violence:    Fear of current or ex partner: Patient refused    Emotionally abused: Patient refused    Physically abused: Patient refused    Forced sexual activity: Patient refused  Other Topics Concern  . Not on file  Social History Narrative  . Not on file     Physical Exam: Temp:  [97.5 F (36.4 C)-97.6 F (36.4 C)] 97.6 F (36.4 C) (10/05  2152) Pulse Rate:  [79-88] 88 (10/05 2152) Resp:  [16-18] 16 (10/05 2152) BP: (105-116)/(69-93) 113/93 (10/05 2152) SpO2:  [96 %-100 %] 96 % (10/05 2226) Weight:  [009 kg] 137 kg (10/05 2146) Last BM Date: 11/20/17  Patient is alert and in no acute distress. Conjunctiva is pink.  Sclerae nonicteric. Oropharyngeal mucosa is normal No neck masses or thyromegaly noted. Cardiac exam with irregular rhythm normal S1 and S2.  No murmur gallop noted. Lungs are clear to auscultation. Abdomen is obese.  He has long right upper paramedian scar.  He has some scarring in the mid abdomen he has 3 ventral hernia 1 and the midline above umbilicus and to below on either side.  They are completely reducible.  Abdomen is soft.  He has mild tenderness at LLQ.  No organomegaly or masses. He does not have peripheral edema or clubbing.   Lab Results: Recent Labs    11/19/17 1852 11/20/17 0351 11/20/17 1133  WBC 7.1  --   --   HGB 11.0* 11.8* 11.7*  HCT 34.6* 37.6* 36.4*  PLT 135*  --   --    BMET Recent Labs    11/19/17 1852 11/20/17 0351  NA 138 137  K 4.1 4.6  CL 105 104  CO2 29 29  GLUCOSE 111* 106*  BUN 30* 25*  CREATININE 0.97 0.85  CALCIUM 8.8* 8.7*   LFT Recent Labs    11/19/17 1852  PROT 6.4*  ALBUMIN 3.5  AST 20  ALT 27  ALKPHOS 103  BILITOT 0.6   PT/INR Recent Labs    11/19/17 1852 11/20/17 0351  LABPROT 31.7* 29.7*  INR 3.10  2.85    Assessment;  Patient is 57 year old Caucasian male who is anticoagulated because of atrial fibrillation who is been experiencing rectal bleeding intermittently for over a month.  He underwent colonoscopy about 16 days ago with removal of small polyps via cold snare(3 polyps are tubular adenomas and the fourth polyp was sessile serrated polyp).  He also had external hemorrhoids. Patient presents with recurrent bleeding which started about 3 days ago.  He has not passed any more blood per rectum since last night. Suspect hemorrhoidal  bleeding.  Bleeding continues he may need further evaluation to confirm the bleeding source before banding or other treatment options considered. Patient is prone to constipation and would benefit from fiber supplement as well as stool softener.  Patient has mild thrombocytopenia.  Thrombocytopenia dates back to at least March 26, 2016. Review of CT from 07/20/2017 reveals no abnormality to liver disease suggest cirrhosis.  Therefore thrombocytopenia may be due to medications.  Recommendations;  Anusol HC Suppository 1 per rectum daily at bedtime for 2 weeks. Metamucil or equivalent 4 g p.o. Nightly. Colace 200 mg p.o. Nightly. Patient can resume warfarin on 11/24/2017. We will check H&H in 2 weeks.   My office will contact patient.     LOS: 0 days   Caroleen Stoermer  11/20/2017, 12:21 PM

## 2017-11-20 NOTE — Discharge Summary (Signed)
Physician Discharge Summary  Dale Franklin GQQ:[id number] DOB: [birth year] DOA: 11/19/2017  PCP: Karsten Ro, DO  Admit date: 11/19/2017 Discharge date: 11/20/2017  Time spent: 35 minutes  Recommendations for Outpatient Follow-up:  1. Repeat CBC to follow hemoglobin trend 2. Follow INR 3. Close follow up to CBG's; adjust hypoglycemic regimen as needed.    Discharge Diagnoses:  Principal Problem:   Acute GI bleeding Active Problems:   Prostate cancer ([place])   COPD (chronic obstructive pulmonary disease) ([facility])   Depression   Hypertension   Atrial fibrillation, chronic   Diabetes mellitus type II, non insulin dependent ([place]) Obesity class 2  Discharge Condition: Stable and improved.  Patient discharged home with instruction to follow-up with PCP and gastroenterology service as an outpatient.  Diet recommendation: Heart healthy, modified carbohydrates, low calorie diet.  Filed Weights   11/19/17 1811 11/19/17 2146  Weight: (!) 137 kg (!) 137 kg    History of present illness:  As per H&P written by Dr. Myna Hidalgo on 11/19/2017 57 y.o. male with medical history significant for type 2 diabetes mellitus, COPD, prostate cancer status post radiation, hypertension, and atrial fibrillation on Coumadin, now presenting to the emergency department with 1 day of rectal bleeding.  The patient underwent colonoscopy on 11/04/2017 with polypectomy x4 (tubular adenomas on pathology report), and continued to do well until yesterday when he noted some red blood per rectum.  He reports that stool was loose, there was mild discomfort in the left lower quadrant but no real abdominal pain.  He denies any associated nausea or vomiting.  He has gone on to have a couple more episodes of red blood per rectum.  Denies any recent shortness of breath, cough, chest pain, fevers, or chills.  Developed some mild lightheadedness upon standing and presents to the ED for evaluation.    Hospital Course:  1-rectal  bleeding -No further bleeding appreciated since admission. -GI has seen patient and there is no needs for interventions. -Bleeding most likely associated with hemorrhoids -Patient will be discharged on Anusol treatment, stool softener and instruction to keep himself well-hydrated Patient advised to stop the use of NSAIDs and to resume his Coumadin on 10/10. -Outpatient follow-up with GI will be arranged.  2-atrial fibrillation: Chronic -Rate controlled and stable -CHADsVASC score 2 -resume coumaidn for secondary prevention on 10/10 -INR therapeutic currently (2.8)  3-type 2 diabetes mellitus -Last A1c 6.5 -Continue the use of metformin and glipizide. -Outpatient follow-up with PCP to further adjust hypoglycemic regimen as needed.  4-essential hypertension -Blood pressure stable. -Resume outpatient antihypertensive regimen. -Healthy diet has been discussed with patient.  5-class II obesity -Body mass index is 37.75 kg/m. -Low calorie diet, portion control and increase physical activity has been discussed with patient.  6-prostate cancer -Continue outpatient follow-up with oncology and urology service -Continue Lupron.  7-depression -Mood stable -No suicidal ideation or hallucination -Continue Cymbalta and trazodone.  Procedures:  See below for x-ray reports.  Consultations:  GI  Discharge Exam: Vitals:   11/19/17 2152 11/19/17 2226  BP: (!) 113/93   Pulse: 88   Resp: 16   Temp: 97.6 F (36.4 C)   SpO2: 96% 96%    General: Afebrile, no chest pain, no nausea, no vomiting.  Patient reports no further bloody stools appreciated. Cardiovascular: S1-S2, no rubs, no gallops, no JVD. Respiratory: Good air movement bilaterally, no wheezing, no crackles. Abdomen: Soft, nontender, positive bowel sounds. Extremities: Trace edema bilaterally, no cyanosis, no clubbing.  Discharge Instructions   Discharge Instructions  Diet - low sodium heart healthy   Complete by:   As directed    Discharge instructions   Complete by:  As directed    Take medications as prescribed Keep yourself well-hydrated Arrange follow-up with PCP in 10 days Follow-up with gastroenterology service as instructed. Hold Coumadin until 11/24/2017; then resume as previously prescribed and follow up with coumadin clinic.     Allergies as of 11/20/2017   No Known Allergies     Medication List    TAKE these medications   albuterol 108 (90 Base) MCG/ACT inhaler Commonly known as:  PROVENTIL HFA;VENTOLIN HFA Inhale 1-2 puffs into the lungs every 6 (six) hours as needed for wheezing or shortness of breath.   docusate sodium 100 MG capsule Commonly known as:  COLACE Take 2 capsules (200 mg total) by mouth daily.   DULoxetine 30 MG capsule Commonly known as:  CYMBALTA Take 90 mg by mouth daily.   glipiZIDE 10 MG tablet Commonly known as:  GLUCOTROL Take 10 mg by mouth 2 (two) times daily.   hydrocortisone 25 MG suppository Commonly known as:  ANUSOL-HC Place 1 suppository (25 mg total) rectally daily for 14 days.   leuprolide 11.25 MG injection Commonly known as:  LUPRON Inject 11.25 mg into the muscle every 4 (four) months.   lisinopril-hydrochlorothiazide 10-12.5 MG tablet Commonly known as:  PRINZIDE,ZESTORETIC Take 1 tablet by mouth daily.   metFORMIN 500 MG 24 hr tablet Commonly known as:  GLUCOPHAGE-XR Take 500 mg by mouth 2 (two) times daily.   metoprolol succinate 25 MG 24 hr tablet Commonly known as:  TOPROL-XL Take 25 mg by mouth 2 (two) times daily.   morphine 30 MG tablet Commonly known as:  MSIR Take 30 mg by mouth every 8 (eight) hours as needed for pain.   oxybutynin 5 MG tablet Commonly known as:  DITROPAN Take 5 mg by mouth 3 (three) times daily.   psyllium 58.6 % powder Commonly known as:  METAMUCIL Take 2 packets by mouth daily.   simvastatin 10 MG tablet Commonly known as:  ZOCOR Take 10 mg by mouth at bedtime.   traZODone 50 MG  tablet Commonly known as:  DESYREL Take 50 mg by mouth at bedtime.   warfarin 10 MG tablet Commonly known as:  COUMADIN Take 1 tablet (10 mg total) by mouth every evening. Hold until 11/24/17 What changed:  additional instructions   warfarin 2.5 MG tablet Commonly known as:  COUMADIN Take 1 tablet (2.5 mg total) by mouth at bedtime. Hold until 10/10 What changed:  additional instructions      No Known Allergies Follow-up Information    Karsten Ro, DO. Schedule an appointment as soon as possible for a visit in 10 day(s).   Specialty:  Family Medicine Contact information: [address] [place] [address] [phone]        Rogene Houston, MD Follow up today.   Specialty:  Gastroenterology Why:  office will call you with appointment details. Contact information: [address], [address] [place] [address] [phone]           The results of significant diagnostics from this hospitalization (including imaging, microbiology, ancillary and laboratory) are listed below for reference.     Labs: Basic Metabolic Panel: Recent Labs  Lab 11/19/17 1852 11/20/17 0351  NA 138 137  K 4.1 4.6  CL 105 104  CO2 29 29  GLUCOSE 111* 106*  BUN 30* 25*  CREATININE 0.97  0.85  CALCIUM 8.8* 8.7*   Liver Function Tests: Recent Labs  Lab 11/19/17 1852  AST 20  ALT 27  ALKPHOS 103  BILITOT 0.6  PROT 6.4*  ALBUMIN 3.5   CBC: Recent Labs  Lab 11/19/17 1852 11/20/17 0351 11/20/17 1133  WBC 7.1  --   --   HGB 11.0* 11.8* 11.7*  HCT 34.6* 37.6* 36.4*  MCV 86.7  --   --   PLT 135*  --   --    CBG: Recent Labs  Lab 11/19/17 2202 11/20/17 0002 11/20/17 0334 11/20/17 0749 11/20/17 1143  GLUCAP 96 106* 92 114* 80    Signed:  Barton Dubois MD.  [facility] 11/20/2017, 1:02 PM

## 2017-11-24 ENCOUNTER — Telehealth (INDEPENDENT_AMBULATORY_CARE_PROVIDER_SITE_OTHER): Payer: Self-pay | Admitting: *Deleted

## 2017-11-24 NOTE — Telephone Encounter (Signed)
Patient had procedure 9/20/219. After the procedure he says that he misplaced his paper work. He experienced rectal bleeding afterwards. Then , he states that he has been constipated. He took laxatives and last night and this morning he had good Bm's.He admits that he has not done anything that he was told to do post the procedure because of misplacing the paper work.The suppositories would be $75 and he could not afford that.  He is complaining of edema from the knees down, the patient was also seen in the ED October 5 th for evaluation of Rectal Bleeding.  Contact Number : [phone]

## 2017-11-24 NOTE — Telephone Encounter (Signed)
Patient went to the ED on 11/19/2017 for evaluation.

## 2017-11-28 ENCOUNTER — Telehealth (INDEPENDENT_AMBULATORY_CARE_PROVIDER_SITE_OTHER): Payer: Self-pay | Admitting: Internal Medicine

## 2017-11-28 NOTE — Telephone Encounter (Signed)
Patient would like for you to call him at ph# [phone]

## 2017-11-28 NOTE — Telephone Encounter (Signed)
No answer

## 2017-11-28 NOTE — Telephone Encounter (Signed)
Message left on answering machine 

## 2017-11-28 NOTE — Telephone Encounter (Signed)
No answer. Will try again later. 

## 2017-12-13 DIAGNOSIS — M79629 Pain in unspecified upper arm: Secondary | ICD-10-CM | POA: Diagnosis not present

## 2017-12-13 DIAGNOSIS — M25512 Pain in left shoulder: Secondary | ICD-10-CM | POA: Diagnosis not present

## 2017-12-13 DIAGNOSIS — G4733 Obstructive sleep apnea (adult) (pediatric): Secondary | ICD-10-CM | POA: Diagnosis not present

## 2017-12-13 NOTE — Telephone Encounter (Signed)
This is old note. Patient was briefly hospitalized.

## 2017-12-19 ENCOUNTER — Telehealth (INDEPENDENT_AMBULATORY_CARE_PROVIDER_SITE_OTHER): Payer: Self-pay | Admitting: Internal Medicine

## 2017-12-19 NOTE — Telephone Encounter (Signed)
Forwarded to Dr.Rehman. 

## 2017-12-19 NOTE — Telephone Encounter (Signed)
Patient left message over the weekend stating he is still having some rectal bleeding after having a colonoscopy on 10-17-17  -  Please call [phone]

## 2017-12-19 NOTE — Telephone Encounter (Signed)
Dale Franklin, would u show Dr Laural Golden this since he is having rectal bleeding after procedure.

## 2017-12-19 NOTE — Telephone Encounter (Signed)
Patient was called message left asking him to call so that we may found out additional information.

## 2017-12-30 ENCOUNTER — Observation Stay (HOSPITAL_COMMUNITY)
Admission: EM | Admit: 2017-12-30 | Discharge: 2018-01-01 | Disposition: A | Discharge status: Home / Self Care | Payer: Medicare Other | Attending: Family Medicine | Admitting: Family Medicine

## 2017-12-30 ENCOUNTER — Other Ambulatory Visit: Payer: Self-pay

## 2017-12-30 ENCOUNTER — Encounter (HOSPITAL_COMMUNITY)
Admission: EM | Disposition: A | Discharge status: Home / Self Care | Payer: Self-pay | Source: Home / Self Care | Attending: Emergency Medicine

## 2017-12-30 ENCOUNTER — Emergency Department (HOSPITAL_COMMUNITY): Payer: Medicare Other

## 2017-12-30 ENCOUNTER — Encounter (HOSPITAL_COMMUNITY): Payer: Self-pay | Admitting: Emergency Medicine

## 2017-12-30 DIAGNOSIS — Z923 Personal history of irradiation: Secondary | ICD-10-CM | POA: Insufficient documentation

## 2017-12-30 DIAGNOSIS — D500 Iron deficiency anemia secondary to blood loss (chronic): Secondary | ICD-10-CM | POA: Diagnosis not present

## 2017-12-30 DIAGNOSIS — F329 Major depressive disorder, single episode, unspecified: Secondary | ICD-10-CM | POA: Insufficient documentation

## 2017-12-30 DIAGNOSIS — J449 Chronic obstructive pulmonary disease, unspecified: Secondary | ICD-10-CM | POA: Diagnosis present

## 2017-12-30 DIAGNOSIS — K529 Noninfective gastroenteritis and colitis, unspecified: Secondary | ICD-10-CM | POA: Diagnosis present

## 2017-12-30 DIAGNOSIS — C61 Malignant neoplasm of prostate: Secondary | ICD-10-CM | POA: Diagnosis not present

## 2017-12-30 DIAGNOSIS — K922 Gastrointestinal hemorrhage, unspecified: Secondary | ICD-10-CM | POA: Diagnosis not present

## 2017-12-30 DIAGNOSIS — E119 Type 2 diabetes mellitus without complications: Secondary | ICD-10-CM | POA: Diagnosis not present

## 2017-12-30 DIAGNOSIS — I10 Essential (primary) hypertension: Secondary | ICD-10-CM | POA: Diagnosis present

## 2017-12-30 DIAGNOSIS — I482 Chronic atrial fibrillation, unspecified: Secondary | ICD-10-CM | POA: Diagnosis not present

## 2017-12-30 DIAGNOSIS — F32A Depression, unspecified: Secondary | ICD-10-CM | POA: Diagnosis present

## 2017-12-30 DIAGNOSIS — Z720 Tobacco use: Secondary | ICD-10-CM | POA: Diagnosis present

## 2017-12-30 DIAGNOSIS — K644 Residual hemorrhoidal skin tags: Secondary | ICD-10-CM | POA: Insufficient documentation

## 2017-12-30 DIAGNOSIS — Z7951 Long term (current) use of inhaled steroids: Secondary | ICD-10-CM | POA: Diagnosis not present

## 2017-12-30 DIAGNOSIS — K5521 Angiodysplasia of colon with hemorrhage: Principal | ICD-10-CM | POA: Insufficient documentation

## 2017-12-30 DIAGNOSIS — Z833 Family history of diabetes mellitus: Secondary | ICD-10-CM | POA: Insufficient documentation

## 2017-12-30 DIAGNOSIS — K625 Hemorrhage of anus and rectum: Secondary | ICD-10-CM | POA: Diagnosis present

## 2017-12-30 DIAGNOSIS — I708 Atherosclerosis of other arteries: Secondary | ICD-10-CM | POA: Diagnosis not present

## 2017-12-30 DIAGNOSIS — Z7984 Long term (current) use of oral hypoglycemic drugs: Secondary | ICD-10-CM | POA: Insufficient documentation

## 2017-12-30 DIAGNOSIS — Z8042 Family history of malignant neoplasm of prostate: Secondary | ICD-10-CM | POA: Insufficient documentation

## 2017-12-30 DIAGNOSIS — Z8719 Personal history of other diseases of the digestive system: Secondary | ICD-10-CM | POA: Insufficient documentation

## 2017-12-30 DIAGNOSIS — Z809 Family history of malignant neoplasm, unspecified: Secondary | ICD-10-CM | POA: Diagnosis not present

## 2017-12-30 DIAGNOSIS — D734 Cyst of spleen: Secondary | ICD-10-CM | POA: Diagnosis not present

## 2017-12-30 DIAGNOSIS — Z6839 Body mass index (BMI) 39.0-39.9, adult: Secondary | ICD-10-CM | POA: Diagnosis not present

## 2017-12-30 DIAGNOSIS — K6289 Other specified diseases of anus and rectum: Secondary | ICD-10-CM | POA: Diagnosis not present

## 2017-12-30 DIAGNOSIS — E785 Hyperlipidemia, unspecified: Secondary | ICD-10-CM | POA: Insufficient documentation

## 2017-12-30 DIAGNOSIS — N320 Bladder-neck obstruction: Secondary | ICD-10-CM | POA: Insufficient documentation

## 2017-12-30 DIAGNOSIS — M25512 Pain in left shoulder: Secondary | ICD-10-CM | POA: Insufficient documentation

## 2017-12-30 DIAGNOSIS — K8020 Calculus of gallbladder without cholecystitis without obstruction: Secondary | ICD-10-CM | POA: Diagnosis not present

## 2017-12-30 DIAGNOSIS — Z79899 Other long term (current) drug therapy: Secondary | ICD-10-CM | POA: Diagnosis not present

## 2017-12-30 DIAGNOSIS — K439 Ventral hernia without obstruction or gangrene: Secondary | ICD-10-CM | POA: Insufficient documentation

## 2017-12-30 DIAGNOSIS — G4700 Insomnia, unspecified: Secondary | ICD-10-CM | POA: Insufficient documentation

## 2017-12-30 DIAGNOSIS — E669 Obesity, unspecified: Secondary | ICD-10-CM | POA: Diagnosis not present

## 2017-12-30 DIAGNOSIS — G8929 Other chronic pain: Secondary | ICD-10-CM | POA: Diagnosis present

## 2017-12-30 DIAGNOSIS — F17210 Nicotine dependence, cigarettes, uncomplicated: Secondary | ICD-10-CM | POA: Insufficient documentation

## 2017-12-30 DIAGNOSIS — Z86010 Personal history of colonic polyps: Secondary | ICD-10-CM | POA: Insufficient documentation

## 2017-12-30 DIAGNOSIS — Z7901 Long term (current) use of anticoagulants: Secondary | ICD-10-CM | POA: Insufficient documentation

## 2017-12-30 DIAGNOSIS — M1990 Unspecified osteoarthritis, unspecified site: Secondary | ICD-10-CM | POA: Diagnosis not present

## 2017-12-30 HISTORY — PX: FLEXIBLE SIGMOIDOSCOPY: SHX5431

## 2017-12-30 LAB — COMPREHENSIVE METABOLIC PANEL
ALK PHOS: 88 U/L (ref 38–126)
ALT: 14 U/L (ref 0–44)
AST: 17 U/L (ref 15–41)
Albumin: 3.5 g/dL (ref 3.5–5.0)
Anion gap: 8 (ref 5–15)
BUN: 18 mg/dL (ref 6–20)
CO2: 23 mmol/L (ref 22–32)
CREATININE: 0.79 mg/dL (ref 0.61–1.24)
Calcium: 8.7 mg/dL — ABNORMAL LOW (ref 8.9–10.3)
Chloride: 105 mmol/L (ref 98–111)
GFR calc non Af Amer: >60 mL/min (ref >60)
GLUCOSE: 121 mg/dL — AB (ref 70–99)
Potassium: 4 mmol/L (ref 3.5–5.1)
Sodium: 136 mmol/L (ref 135–145)
Total Bilirubin: 0.7 mg/dL (ref 0.3–1.2)
Total Protein: 6.6 g/dL (ref 6.5–8.1)

## 2017-12-30 LAB — CBC WITH DIFFERENTIAL/PLATELET
Abs Immature Granulocytes: 0.04 10*3/uL (ref 0.00–0.07)
Basophils Absolute: 0.1 10*3/uL (ref 0.0–0.1)
Basophils Relative: 1 %
EOS PCT: 2 %
Eosinophils Absolute: 0.2 10*3/uL (ref 0.0–0.5)
HEMATOCRIT: 38.7 % — AB (ref 39.0–52.0)
HEMOGLOBIN: 12.1 g/dL — AB (ref 13.0–17.0)
Immature Granulocytes: 0 %
LYMPHS ABS: 2.7 10*3/uL (ref 0.7–4.0)
LYMPHS PCT: 28 %
MCH: 26.4 pg (ref 26.0–34.0)
MCHC: 31.3 g/dL (ref 30.0–36.0)
MCV: 84.5 fL (ref 80.0–100.0)
MONO ABS: 0.5 10*3/uL (ref 0.1–1.0)
Monocytes Relative: 6 %
Neutro Abs: 6.1 10*3/uL (ref 1.7–7.7)
Neutrophils Relative %: 63 %
Platelets: 171 10*3/uL (ref 150–400)
RBC: 4.58 MIL/uL (ref 4.22–5.81)
RDW: 15.1 % (ref 11.5–15.5)
WBC: 9.6 10*3/uL (ref 4.0–10.5)
nRBC: 0 % (ref 0.0–0.2)

## 2017-12-30 LAB — TYPE AND SCREEN
ABO/RH(D): O POS
Antibody Screen: NEGATIVE

## 2017-12-30 LAB — GLUCOSE, CAPILLARY
GLUCOSE-CAPILLARY: 94 mg/dL (ref 70–99)
Glucose-Capillary: 76 mg/dL (ref 70–99)
Glucose-Capillary: 100 mg/dL — ABNORMAL HIGH (ref 70–99)
Glucose-Capillary: 73 mg/dL (ref 70–99)
Glucose-Capillary: 89 mg/dL (ref 70–99)
Glucose-Capillary: 82 mg/dL (ref 70–99)

## 2017-12-30 LAB — HEMOGLOBIN
HEMOGLOBIN: 11.7 g/dL — AB (ref 13.0–17.0)
HEMOGLOBIN: 11.1 g/dL — AB (ref 13.0–17.0)

## 2017-12-30 LAB — PROTIME-INR
INR: 2.69
Prothrombin Time: 28.2 seconds — ABNORMAL HIGH (ref 11.4–15.2)

## 2017-12-30 LAB — POC OCCULT BLOOD, ED: FECAL OCCULT BLD: POSITIVE — AB

## 2017-12-30 SURGERY — SIGMOIDOSCOPY, FLEXIBLE
Anesthesia: Moderate Sedation

## 2017-12-30 MED ORDER — IOPAMIDOL (ISOVUE-300) INJECTION 61%
100.0000 mL | Freq: Once | INTRAVENOUS | Status: AC | PRN
  Administered 2017-12-30: 100 mL via INTRAVENOUS

## 2017-12-30 MED ORDER — METRONIDAZOLE IN NACL 5-0.79 MG/ML-% IV SOLN
500.0000 mg | Freq: Three times a day (TID) | INTRAVENOUS | Status: DC
  Administered 2017-12-31 (×2): 500 mg via INTRAVENOUS
  Filled 2017-12-30 (×4): qty 100

## 2017-12-30 MED ORDER — DULOXETINE HCL 60 MG PO CPEP
90.0000 mg | ORAL_CAPSULE | Freq: Every day | ORAL | Status: DC
  Administered 2017-12-30 – 2018-01-01 (×3): 90 mg via ORAL
  Filled 2017-12-30 (×3): qty 1

## 2017-12-30 MED ORDER — MEPERIDINE HCL 50 MG/ML IJ SOLN
INTRAMUSCULAR | Status: AC
  Filled 2017-12-30: qty 1

## 2017-12-30 MED ORDER — TRAZODONE HCL 50 MG PO TABS
50.0000 mg | ORAL_TABLET | Freq: Every day | ORAL | Status: DC
  Administered 2017-12-30 – 2017-12-31 (×2): 50 mg via ORAL
  Filled 2017-12-30 (×2): qty 1

## 2017-12-30 MED ORDER — MEPERIDINE HCL 25 MG/ML IJ SOLN
INTRAMUSCULAR | Status: DC | PRN
  Administered 2017-12-30 (×2): 25 mg via INTRAVENOUS

## 2017-12-30 MED ORDER — MIDAZOLAM HCL 5 MG/5ML IJ SOLN
INTRAMUSCULAR | Status: DC | PRN
  Administered 2017-12-30: 3 mg via INTRAVENOUS
  Administered 2017-12-30 (×3): 2 mg via INTRAVENOUS
  Administered 2017-12-30: 3 mg via INTRAVENOUS

## 2017-12-30 MED ORDER — SIMVASTATIN 10 MG PO TABS
10.0000 mg | ORAL_TABLET | Freq: Every day | ORAL | Status: DC
  Administered 2017-12-30 – 2017-12-31 (×2): 10 mg via ORAL
  Filled 2017-12-30 (×2): qty 1

## 2017-12-30 MED ORDER — INSULIN ASPART 100 UNIT/ML ~~LOC~~ SOLN
0.0000 [IU] | SUBCUTANEOUS | Status: DC
  Administered 2017-12-31 (×3): 1 [IU] via SUBCUTANEOUS
  Administered 2017-12-31 (×2): 2 [IU] via SUBCUTANEOUS
  Administered 2017-12-31 – 2018-01-01 (×2): 1 [IU] via SUBCUTANEOUS

## 2017-12-30 MED ORDER — MORPHINE SULFATE 15 MG PO TABS
30.0000 mg | ORAL_TABLET | Freq: Three times a day (TID) | ORAL | Status: DC | PRN
  Administered 2017-12-30 – 2018-01-01 (×6): 30 mg via ORAL
  Filled 2017-12-30 (×6): qty 2

## 2017-12-30 MED ORDER — FLEET ENEMA 7-19 GM/118ML RE ENEM
1.0000 | ENEMA | Freq: Once | RECTAL | Status: AC
  Administered 2017-12-30: 1 via RECTAL

## 2017-12-30 MED ORDER — CIPROFLOXACIN IN D5W 400 MG/200ML IV SOLN
400.0000 mg | Freq: Two times a day (BID) | INTRAVENOUS | Status: DC
  Administered 2017-12-30 – 2017-12-31 (×2): 400 mg via INTRAVENOUS
  Filled 2017-12-30 (×3): qty 200

## 2017-12-30 MED ORDER — MIDAZOLAM HCL 5 MG/5ML IJ SOLN
INTRAMUSCULAR | Status: AC
  Filled 2017-12-30: qty 5

## 2017-12-30 MED ORDER — ONDANSETRON HCL 4 MG PO TABS: 4.0000 mg | ORAL_TABLET | Freq: Four times a day (QID) | ORAL | Status: DC | PRN

## 2017-12-30 MED ORDER — HYDROCHLOROTHIAZIDE 12.5 MG PO CAPS
12.5000 mg | ORAL_CAPSULE | Freq: Every day | ORAL | Status: DC
  Administered 2017-12-30 – 2018-01-01 (×3): 12.5 mg via ORAL
  Filled 2017-12-30 (×3): qty 1

## 2017-12-30 MED ORDER — DOCUSATE SODIUM 100 MG PO CAPS
100.0000 mg | ORAL_CAPSULE | Freq: Two times a day (BID) | ORAL | Status: DC
  Administered 2017-12-30 – 2018-01-01 (×4): 100 mg via ORAL
  Filled 2017-12-30 (×4): qty 1

## 2017-12-30 MED ORDER — SODIUM CHLORIDE 0.9% FLUSH
3.0000 mL | Freq: Two times a day (BID) | INTRAVENOUS | Status: DC
  Administered 2017-12-30 – 2017-12-31 (×2): 3 mL via INTRAVENOUS

## 2017-12-30 MED ORDER — MIDAZOLAM HCL 5 MG/5ML IJ SOLN
INTRAMUSCULAR | Status: AC
  Filled 2017-12-30: qty 10

## 2017-12-30 MED ORDER — VITAMIN K1 10 MG/ML IJ SOLN
2.0000 mg | Freq: Once | INTRAMUSCULAR | Status: AC
  Administered 2017-12-30: 2 mg via SUBCUTANEOUS
  Filled 2017-12-30: qty 1

## 2017-12-30 MED ORDER — METRONIDAZOLE IN NACL 5-0.79 MG/ML-% IV SOLN
500.0000 mg | Freq: Once | INTRAVENOUS | Status: AC
  Administered 2017-12-30: 500 mg via INTRAVENOUS

## 2017-12-30 MED ORDER — ACETAMINOPHEN 325 MG PO TABS: 650.0000 mg | ORAL_TABLET | Freq: Four times a day (QID) | ORAL | Status: DC | PRN

## 2017-12-30 MED ORDER — LIDOCAINE HCL URETHRAL/MUCOSAL 2 % EX GEL
CUTANEOUS | Status: AC
  Filled 2017-12-30: qty 30

## 2017-12-30 MED ORDER — ALBUTEROL SULFATE (2.5 MG/3ML) 0.083% IN NEBU: 3.0000 mL | INHALATION_SOLUTION | Freq: Four times a day (QID) | RESPIRATORY_TRACT | Status: DC | PRN

## 2017-12-30 MED ORDER — SODIUM CHLORIDE 0.9 % IV BOLUS
1000.0000 mL | Freq: Once | INTRAVENOUS | Status: AC
  Administered 2017-12-30: 1000 mL via INTRAVENOUS

## 2017-12-30 MED ORDER — ONDANSETRON HCL 4 MG/2ML IJ SOLN: 4.0000 mg | Freq: Four times a day (QID) | INTRAMUSCULAR | Status: DC | PRN

## 2017-12-30 MED ORDER — OXYBUTYNIN CHLORIDE 5 MG PO TABS
5.0000 mg | ORAL_TABLET | Freq: Three times a day (TID) | ORAL | Status: DC
  Administered 2017-12-30 – 2018-01-01 (×6): 5 mg via ORAL
  Filled 2017-12-30 (×6): qty 1

## 2017-12-30 MED ORDER — CIPROFLOXACIN IN D5W 400 MG/200ML IV SOLN
400.0000 mg | Freq: Once | INTRAVENOUS | Status: AC
  Administered 2017-12-30: 400 mg via INTRAVENOUS

## 2017-12-30 MED ORDER — METOPROLOL SUCCINATE ER 25 MG PO TB24
25.0000 mg | ORAL_TABLET | Freq: Two times a day (BID) | ORAL | Status: DC
  Administered 2017-12-30 – 2018-01-01 (×5): 25 mg via ORAL
  Filled 2017-12-30 (×5): qty 1

## 2017-12-30 MED ORDER — ACETAMINOPHEN 650 MG RE SUPP: 650.0000 mg | Freq: Four times a day (QID) | RECTAL | Status: DC | PRN

## 2017-12-30 MED ORDER — NICOTINE 14 MG/24HR TD PT24
14.0000 mg | MEDICATED_PATCH | Freq: Every day | TRANSDERMAL | Status: DC
  Administered 2017-12-30 – 2018-01-01 (×3): 14 mg via TRANSDERMAL
  Filled 2017-12-30 (×4): qty 1

## 2017-12-30 NOTE — ED Triage Notes (Signed)
Patient complaining of rectal bleeding intermittent x 2 weeks. States he woke this morning and passed "2 large clots of blood" and has had rectal bleeding through his clothes and onto his car seat. Denies pain to abdomen and rectum.

## 2017-12-30 NOTE — Progress Notes (Signed)
Patient begun on NicoDerm patch 14 mg/day

## 2017-12-30 NOTE — Op Note (Signed)
[facility] Patient Name: Dale Franklin Procedure Date: 12/30/2017 6:03 PM MRN: [id number] Date of Birth: [birth year] Attending MD: Hildred Laser , MD CSN: [id number] Age: 57 Admit Type: Inpatient Procedure:                Flexible Sigmoidoscopy Indications:              Rectal hemorrhage Providers:                Hildred Laser, MD, Janeece Riggers, RN, Randa Spike,                            Technician Referring MD:             Vance Gather, MD Medicines:                Meperidine 75 mg IV, Midazolam 12 mg IV Complications:            No immediate complications. Estimated Blood Loss:     Estimated blood loss:none from procedure. Procedure:                Pre-Anesthesia Assessment:                           - Prior to the procedure, a History and Physical                            was performed, and patient medications and                            allergies were reviewed. The patient's tolerance of                            previous anesthesia was also reviewed. The risks                            and benefits of the procedure and the sedation                            options and risks were discussed with the patient.                            All questions were answered, and informed consent                            was obtained. Prior Anticoagulants: The patient                            last took Coumadin (warfarin) 1 day prior to the                            procedure. ASA Grade Assessment: III - A patient                            with severe systemic disease. After reviewing the  risks and benefits, the patient was deemed in                            satisfactory condition to undergo the procedure.                           After obtaining informed consent, the scope was                            passed under direct vision. The [id number]-[id number]                            ([id number]) scope was introduced through the anus and            advanced to the the sigmoid colon. The flexible                            sigmoidoscopy was accomplished without difficulty.                            The patient tolerated the procedure fairly well.                            The quality of the bowel preparation was good. Scope In: 6:20:33 PM Scope Out: 6:43:10 PM Total Procedure Duration: 0 hours 22 minutes 37 seconds  Findings:      The perianal and digital rectal examinations were normal.      The sigmoid colon appeared normal.      Red blood was found in the rectum.      Active bleeding from telangiectasia, To stop active bleeding, hemostatic       spray was deployed. A single spray was applied. There was no bleeding at       the end of the procedure. Impression:               - The sigmoid colon is normal.                           - Active bleeding from rectal telangiectasia                            controlled with Hemospray.                           - No specimens collected. Moderate Sedation:      Moderate (conscious) sedation was administered by the endoscopy nurse       and supervised by the endoscopist. The following parameters were       monitored: oxygen saturation, heart rate, blood pressure, CO2       capnography and response to care. Total physician intraservice time was       20 minutes. Recommendation:           - Return patient to hospital ward for ongoing care.                           - Full liquid diet today.                           -  Continue present medications.                           - Vit K 2 mg SQx 1                           - CBC and INR in am. Procedure Code(s):        --- Professional ---                           [phone], Sigmoidoscopy, flexible; with control of                            bleeding, any method                           G0500, Moderate sedation services provided by the                            same physician or other qualified health care                             professional performing a gastrointestinal                            endoscopic service that sedation supports,                            requiring the presence of an independent trained                            observer to assist in the monitoring of the                            patient's level of consciousness and physiological                            status; initial 15 minutes of intra-service time;                            patient age 62 years or older (additional time may                            be reported with [phone], as appropriate) Diagnosis Code(s):        --- Professional ---                           K62.5, Hemorrhage of anus and rectum CPT copyright 2018 [employer]. All rights reserved. The codes documented in this report are preliminary and upon coder review may  be revised to meet current compliance requirements. Hildred Laser, MD Hildred Laser, MD 12/30/2017 7:00:34 PM This report has been signed electronically. Number of Addenda: 0

## 2017-12-30 NOTE — ED Notes (Signed)
Patient transported to CT 

## 2017-12-30 NOTE — Progress Notes (Signed)
Patient left floor unattended and was found by another staff member by the main entrance wanting to go to his car.  Patient became belligerent and verbally abusive to staff when escorted back to his room.  Patient informed that he could leave AMA if he wanted to but could not leave the floor.  Patient left in room sitting on side of the bed with siderails up x2 and callbell in reach

## 2017-12-30 NOTE — H&P (Addendum)
History and Physical   Dale Franklin [id number]:[id number] DOB: [birth year] DOA: 12/30/2017  Referring MD/NP/PA: Dr. Thurnell Garbe, [place] PCP: Karsten Ro, DO Outpatient Specialists: Laural Golden, GI  Patient coming from: Home  Chief Complaint: Rectal bleeding  HPI: Dale Franklin is a 57 y.o. male with a history of prostate CA s/p radiation seeds on lupron, AFib on coumadin, T2DM, HTN, HLD, and rectal bleeding returning to the ED for rectal bleeding. He noted abrupt onset of fecal urgency this morning passing a large amount of red blood per rectum containing clots. This is associated with decreased appetite, modest LLQ tenderness, no nausea or vomiting. This is in the setting of intermittent red bleeding with stools for several weeks. He drove himself to the ED and noted bleeding through his clothes onto the car seat on the way. He's had no BM since arrival but feels the same urge as before at the time of interview.   He reports stable HTN, compliant with lisinopril-HCTZ, and metoprolol Stable AFib without chest pain, palpitations, leg swelling, or dyspnea, taking coumadin 12.5mg  daily with stable INR DM reported as stable on metformin, glipizide.   Prostate CA stable followed by Dr. Diona Fanti and [facility] Dr. Quitman Livings.  ED Course: Hypertensive and stable vital signs demonstrating low abdominal tenderness and grossly positive rectal exam for blood, no rectal pain. Hgb 12.1, up from previous discharge on 10/6 which was 11.7g/dl. INR therapeutic. CT abdomen and pelvis demonstrated rectal wall thickening without other acute findings. Type and screen sent, coumadin not reversed, cipro and flagyl started, GI consulted and hospitalists asked to admit.  Review of Systems: No melena, urinary symptoms or other unusual bleeding or bruising, and per HPI. All others reviewed and are negative.   Past Medical History:  Diagnosis Date  . Arthritis   . Bowel obstruction ([place])   . COPD (chronic obstructive pulmonary disease)  ([place])   . Depression   . Diabetes mellitus without complication ([place])   . Dysrhythmia    A flutter  . Hypertension   . Prostate cancer [facility])    Past Surgical History:  Procedure Laterality Date  . COLONOSCOPY WITH PROPOFOL N/A 11/04/2017   Procedure: COLONOSCOPY WITH PROPOFOL;  Surgeon: Rogene Houston, MD;  Location: [facility] ENDO SUITE;  Service: Endoscopy;  Laterality: N/A;  2:25  . GOLD SEED IMPLANT N/A 05/31/2016   Procedure: GOLD SEED IMPLANT;  Surgeon: Franchot Gallo, MD;  Location: [facility] ORS;  Service: Urology;  Laterality: N/A;  . HAND SURGERY Right   . HERNIA REPAIR    . POLYPECTOMY  11/04/2017   Procedure: POLYPECTOMY;  Surgeon: Rogene Houston, MD;  Location: [facility] ENDO SUITE;  Service: Endoscopy;;  cecal polyp (CSx1), transverse colon (CSx2),recto-sigmoid(CSx1)  . TRANSURETHRAL RESECTION OF PROSTATE N/A 05/31/2016   Procedure: TRANSURETHRAL RESECTION OF THE PROSTATE (TURP);  Surgeon: Franchot Gallo, MD;  Location: [facility] ORS;  Service: Urology;  Laterality: N/A;  . WRIST SURGERY Right    - Tobacco use ~0.5ppd, no significant EtOH and no illicit drugs. Moved back from [place] and lives independently.  reports that he has been smoking cigarettes. He has been smoking about 0.50 packs per day. He has never used smokeless tobacco. He reports that he drank alcohol. He reports that he does not use drugs. No Known Allergies Family History  Problem Relation Age of Onset  . Diabetes Mother   . Cancer Father   . Cancer Paternal Uncle        prostate   - Family history otherwise reviewed and not  pertinent.  Prior to Admission medications   Medication Sig Start Date End Date Taking? Authorizing Provider  albuterol (PROVENTIL HFA;VENTOLIN HFA) 108 (90 Base) MCG/ACT inhaler Inhale 1-2 puffs into the lungs every 6 (six) hours as needed for wheezing or shortness of breath.   Yes [provider]  docusate sodium (COLACE) 100 MG capsule Take 2 capsules (200 mg total) by mouth daily.  11/20/17 11/20/18 Yes Barton Dubois, MD  DULoxetine (CYMBALTA) 30 MG capsule Take 90 mg by mouth daily.    Yes [provider]  glipiZIDE (GLUCOTROL) 10 MG tablet Take 10 mg by mouth 2 (two) times daily.   Yes [provider]  leuprolide (LUPRON) 11.25 MG injection Inject 11.25 mg into the muscle every 4 (four) months.    Yes [provider]  lisinopril-hydrochlorothiazide (PRINZIDE,ZESTORETIC) 10-12.5 MG tablet Take 1 tablet by mouth daily.    Yes [provider]  metFORMIN (GLUCOPHAGE-XR) 500 MG 24 hr tablet Take 500 mg by mouth 2 (two) times daily.    Yes [provider]  metoprolol succinate (TOPROL-XL) 25 MG 24 hr tablet Take 25 mg by mouth 2 (two) times daily.    Yes [provider]  morphine (MSIR) 30 MG tablet Take 30 mg by mouth every 8 (eight) hours as needed for pain. 10/13/17  Yes [provider]  oxybutynin (DITROPAN) 5 MG tablet Take 5 mg by mouth 3 (three) times daily.    Yes [provider]  simvastatin (ZOCOR) 10 MG tablet Take 10 mg by mouth at bedtime. 06/30/17  Yes [provider]  traZODone (DESYREL) 50 MG tablet Take 50 mg by mouth at bedtime.   Yes [provider]  warfarin (COUMADIN) 10 MG tablet Take 1 tablet (10 mg total) by mouth every evening. Hold until 11/24/17 11/20/17  Yes Barton Dubois, MD  warfarin (COUMADIN) 2.5 MG tablet Take 1 tablet (2.5 mg total) by mouth at bedtime. Hold until 10/10 11/20/17  Yes Barton Dubois, MD  psyllium (METAMUCIL MULTIHEALTH FIBER) 58.6 % powder Take 2 packets by mouth daily. 11/20/17   Barton Dubois, MD    Physical Exam: Vitals:   12/30/17 0900 12/30/17 0930 12/30/17 1100 12/30/17 1130  BP: 127/67 (!) 173/90 (!) 143/84   Pulse: 83 81 87 88  Resp: 14 (!) 22 12 12   Temp:    98 F (36.7 C)  TempSrc:    Oral  SpO2: 95% 96% 100% 100%  Weight:      Height:       Constitutional: 57 y.o. male in no distress, calm demeanor Eyes: Lids and  conjunctivae normal, PERRL ENMT: Mucous membranes are moist. Posterior pharynx clear of any exudate or lesions. Fair dentition.  Neck: normal, supple, no masses, no thyromegaly Respiratory: Non-labored breathing room air without accessory muscle use. Clear breath sounds to auscultation bilaterally Cardiovascular: Regular rate and rhythm, no murmurs, rubs, or gallops. No carotid bruits. No JVD. No LE edema. 2+ pedal pulses. Abdomen: Normoactive bowel sounds. No tenderness, non-distended, and no masses palpated. No hepatosplenomegaly. GU: No indwelling catheter Musculoskeletal: No clubbing / cyanosis. No joint deformity upper and lower extremities. Good ROM, no contractures. Normal muscle tone.  Skin: Warm, dry. No rashes, wounds, or ulcers. No significant lesions noted.  Neurologic: CN II-XII grossly intact. Gait not assessed. Speech normal. No focal deficits in motor strength or sensation in all extremities.  Psychiatric: Alert and oriented x3. Normal judgment and insight. Mood euthymic with congruent affect.   Labs on Admission: I have  personally reviewed following labs and imaging studies  CBC: Recent Labs  Lab 12/30/17 0813  WBC 9.6  NEUTROABS 6.1  HGB 12.1*  HCT 38.7*  MCV 84.5  PLT 182   Basic Metabolic Panel: Recent Labs  Lab 12/30/17 0813  NA 136  K 4.0  CL 105  CO2 23  GLUCOSE 121*  BUN 18  CREATININE 0.79  CALCIUM 8.7*   GFR: Estimated Creatinine Clearance: 149.6 mL/min (by C-G formula based on SCr of 0.79 mg/dL). Liver Function Tests: Recent Labs  Lab 12/30/17 0813  AST 17  ALT 14  ALKPHOS 88  BILITOT 0.7  PROT 6.6  ALBUMIN 3.5   No results for input(s): LIPASE, AMYLASE in the last 168 hours. No results for input(s): AMMONIA in the last 168 hours. Coagulation Profile: Recent Labs  Lab 12/30/17 0813  INR 2.69   Cardiac Enzymes: No results for input(s): CKTOTAL, CKMB, CKMBINDEX, TROPONINI in the last 168 hours. BNP (last 3 results) No results for  input(s): PROBNP in the last 8760 hours. HbA1C: No results for input(s): HGBA1C in the last 72 hours. CBG: No results for input(s): GLUCAP in the last 168 hours. Lipid Profile: No results for input(s): CHOL, HDL, LDLCALC, TRIG, CHOLHDL, LDLDIRECT in the last 72 hours. Thyroid Function Tests: No results for input(s): TSH, T4TOTAL, FREET4, T3FREE, THYROIDAB in the last 72 hours. Anemia Panel: No results for input(s): VITAMINB12, FOLATE, FERRITIN, TIBC, IRON, RETICCTPCT in the last 72 hours. Urine analysis:    Component Value Date/Time   COLORURINE YELLOW 07/21/2017 2230   APPEARANCEUR HAZY (A) 07/21/2017 2230   LABSPEC 1.031 (H) 07/21/2017 2230   PHURINE 5.0 07/21/2017 2230   GLUCOSEU NEGATIVE 07/21/2017 2230   HGBUR NEGATIVE 07/21/2017 2230   BILIRUBINUR NEGATIVE 07/21/2017 2230   KETONESUR 5 (A) 07/21/2017 2230   PROTEINUR NEGATIVE 07/21/2017 2230   NITRITE NEGATIVE 07/21/2017 2230   LEUKOCYTESUR NEGATIVE 07/21/2017 2230    No results found for this or any previous visit (from the past 240 hour(s)).   Radiological Exams on Admission: Ct Abdomen Pelvis W Contrast  Result Date: 12/30/2017 CLINICAL DATA:  Intermittent rectal bleeding. History of prostate carcinoma. EXAM: CT ABDOMEN AND PELVIS WITH CONTRAST TECHNIQUE: Multidetector CT imaging of the abdomen and pelvis was performed using the standard protocol following bolus administration of intravenous contrast. CONTRAST:  119mL ISOVUE-300 IOPAMIDOL (ISOVUE-300) INJECTION 61% COMPARISON:  None. FINDINGS: Lower chest: There is scarring in the left lung base region. No lung base edema or consolidation. Hepatobiliary: No focal liver lesions are appreciable. There are small gallstones in the gallbladder. The gallbladder wall does not appear appreciably thickened. There is no biliary duct dilatation. Pancreas: There is no evident pancreatic mass or inflammatory focus. Spleen: There is a cyst arising from the anterior spleen measuring 1.1 x  1.0 cm. No other splenic lesions are evident. Adrenals/Urinary Tract: There is a stable nodular opacity in the anterior right adrenal measuring 1.0 x 0.7 cm. Adrenals bilaterally otherwise appear normal. Kidneys bilaterally show no evident mass or hydronephrosis on either side. There is no renal or ureteral calculus on either side. Urinary bladder is midline with wall thickness within normal limits. Stomach/Bowel: There is mild rectal wall thickening. No fistula or perirectal stranding evident. No bowel wall thickening is noted elsewhere. Note that there is a midline lower abdominal wall hernia which contains loops of small and large bowel without bowel compromise evident. There is no demonstrable bowel obstruction. There is no free air or portal venous air. Vascular/Lymphatic: There  is aortic and iliac artery atherosclerosis. No aneurysm evident. Major mesenteric arterial vessels are patent. There is no appreciable adenopathy in the abdomen or pelvis. Reproductive: Seed implants are noted in the prostate. The prostate and seminal vesicles are normal in size and contour. No pelvic mass is demonstrated on this study. Other: Appendix appears normal. No abscess or ascites is evident in the abdomen or pelvis. There is a ventral hernia in the lower abdomen which contains bowel. The neck of the hernia from right to left dimension measures 7.7 cm. Superior to inferior dimension of the hernia measures 4.5 cm. Musculoskeletal: There are no blastic or lytic bone lesions. There is no intramuscular lesion. IMPRESSION: 1. Wall thickening in the rectum, felt to represent a degree of proctitis. No perirectal stranding or abnormal fluid. Question proctitis as cause for rectal bleeding. Given rectal bleeding, direct visualization of the colon is felt to be warranted after appropriate colonic cleansing. 2. Lower abdominal ventral hernia which contains loops of small and large bowel without bowel compromise. 3.  Cholelithiasis. 4. No  bowel obstruction. No abscess in the abdomen or pelvis. Appendix appears normal. 5.  Status post seed implant therapy for prostate carcinoma. 6.  No renal or ureteral calculus.  No hydronephrosis. 7.  Stable small nodular opacity in the right adrenal. Electronically Signed   By: Lowella Grip III M.D.   On: 12/30/2017 10:16   Assessment/Plan Principal Problem:   Proctocolitis with rectal bleeding Active Problems:   Malignant neoplasm of prostate (HCC)   COPD (chronic obstructive pulmonary disease) (HCC)   Depression   Diabetes mellitus without complication (HCC)   Hypertension   Tobacco abuse   Obesity (BMI 30-39.9)   Atrial fibrillation, chronic   Rectal bleeding   Acute GI bleeding   Chronic left shoulder pain   Proctitis with rectal bleeding and acute on chronic blood loss anemia: - Continue cipro/flagyl - GI consulted, planning flex sig, keep NPO or diet per GI. - Has IV access, type and screen performed, trend H/H q6h  - Hold anticoagulation for now. INR is therapeutic. Hemodynamically stable, so not planning on reversing, but would defer to GI.   AFib: Stable, rate controlled. - CHA2DS2-VASc Scoreis 2 due to HTN and DM. In the setting of recurrent GI bleeding, ?whether aspirin would be an acceptable alternative. Would have to discuss with patient prior to any changes.  - Hold blood thinners for now. - Continue metoprolol  T2DM:  - Will hold metformin and OSU given recent contrast, cover with SSI AC/HS  HTN:  - Restart HCTZ and metoprolol, restart lisinopril 11/16 if Cr stable.  Prostate CA s/p TURP: ?radiation proctitis.  - Continue oxybutynin, no recent BOO, LUTS  Chronic left shoulder pain: Under care of pain management.  - Continue home morphine 30mg  TID, cymbalta  COPD: No flare - continue prn albuterol  Hyperlipidemia:  - Continue statin  Insomnia:  - Continue trazodone  DVT prophylaxis: SCDs  Code Status: Full  Family Communication: None at  bedside Disposition Plan: Home once hemodynamically stable, no further bleeding Consults called: GI, Dr. Laural Golden  Admission status: Observation    Patrecia Pour, MD [facility] [email or web address] Password [email or web address] 12/30/2017, 12:02 PM

## 2017-12-30 NOTE — Consult Note (Signed)
Reason for Consult: rectal bleeding Referring Physician:   [name] is an 57 y.o. male.  HPI: Presented to the ED around 730am with rectal bleeding. States he passed 2 large clots while at home. States has had rectal bleeding off and on for about a month. Admitted in October for same and was observed. He maintained his hemoglobin. States he has not been constipated. Takes a stool softener. BMs are somewhat soft. Underwent a colonoscopy in September of this year for rectal bleeding. Impression: - Four 4 to 7 mm polyps at the recto-sigmoid colon,  in the transverse colon and in the cecum, removed    with a cold snare. Resected and retrieved. 3 tubular adenomas and one sessile serrated polyp.   External hemorrhoids. No rectal bleeding since being in ED, but does feel if he went to [facility], he would pass blood.    12/30/2017 PT/INR  28.2 and 2.69    Takes Coumadin 12.70m daily for atrial fib.   Past Medical History:  Diagnosis Date  . Arthritis   . Bowel obstruction ([place]   . COPD (chronic obstructive pulmonary disease) ([place]   . Depression   . Diabetes mellitus without complication ([place]   . Dysrhythmia    A flutter  . Hypertension   . Prostate cancer ([facility]     Past Surgical History:  Procedure Laterality Date  . COLONOSCOPY WITH PROPOFOL N/A 11/04/2017   Procedure: COLONOSCOPY WITH PROPOFOL;  Surgeon: RRogene Houston MD;  [facility];  Service: Endoscopy;  Laterality: N/A;  2:25  . GOLD SEED IMPLANT N/A 05/31/2016   Procedure: GOLD SEED IMPLANT;  Surgeon: SFranchot Gallo MD;  Location: [facility];  Service: Urology;  Laterality: N/A;  . HAND SURGERY Right   . HERNIA REPAIR    . POLYPECTOMY  11/04/2017   Procedure: POLYPECTOMY;  Surgeon: RRogene Houston MD;  Location: [facility];  Service: Endoscopy;;  cecal polyp (CSx1), transverse colon (CSx2),recto-sigmoid(CSx1)  . TRANSURETHRAL RESECTION OF PROSTATE N/A 05/31/2016   Procedure: TRANSURETHRAL RESECTION OF THE  PROSTATE (TURP);  Surgeon: SFranchot Gallo MD;  Location: [facility];  Service: Urology;  Laterality: N/A;  . WRIST SURGERY Right     Family History  Problem Relation Age of Onset  . Diabetes Mother   . Cancer Father   . Cancer Paternal Uncle        prostate    Social History:  reports that he has been smoking cigarettes. He has been smoking about 0.50 packs per day. He has never used smokeless tobacco. He reports that he drank alcohol. He reports that he does not use drugs.  Allergies: No Known Allergies  Medications: I have reviewed the patient's current medications.  Results for orders placed or performed during the hospital encounter of 12/30/17 (from the past 48 hour(s))  Comprehensive metabolic panel     Status: Abnormal   Collection Time: 12/30/17  8:13 AM  Result Value Ref Range   Sodium 136 135 - 145 mmol/L   Potassium 4.0 3.5 - 5.1 mmol/L   Chloride 105 98 - 111 mmol/L   CO2 23 22 - 32 mmol/L   Glucose, Bld 121 (H) 70 - 99 mg/dL   BUN 18 6 - 20 mg/dL   Creatinine, Ser 0.79 0.61 - 1.24 mg/dL   Calcium 8.7 (L) 8.9 - 10.3 mg/dL   Total Protein 6.6 6.5 - 8.1 g/dL   Albumin 3.5 3.5 - 5.0 g/dL   AST 17 15 - 41 U/L  ALT 14 0 - 44 U/L   Alkaline Phosphatase 88 38 - 126 U/L   Total Bilirubin 0.7 0.3 - 1.2 mg/dL   GFR calc non Af Amer >60 >60 mL/min   GFR calc Af Amer >60 >60 mL/min    Comment: (NOTE) The eGFR has been calculated using the CKD EPI equation. This calculation has not been validated in all clinical situations. eGFR's persistently <60 mL/min signify possible Chronic Kidney Disease.    Anion gap 8 5 - 15    Comment: Performed at [facility], [address] [place], [place] [address]  CBC WITH DIFFERENTIAL     Status: Abnormal   Collection Time: 12/30/17  8:13 AM  Result Value Ref Range   WBC 9.6 4.0 - 10.5 K/uL   RBC 4.58 4.22 - 5.81 MIL/uL   Hemoglobin 12.1 (L) 13.0 - 17.0 g/dL   HCT 38.7 (L) 39.0 - 52.0 %   MCV 84.5 80.0 - 100.0 fL   MCH 26.4 26.0  - 34.0 pg   MCHC 31.3 30.0 - 36.0 g/dL   RDW 15.1 11.5 - 15.5 %   Platelets 171 150 - 400 K/uL   nRBC 0.0 0.0 - 0.2 %   Neutrophils Relative % 63 %   Neutro Abs 6.1 1.7 - 7.7 K/uL   Lymphocytes Relative 28 %   Lymphs Abs 2.7 0.7 - 4.0 K/uL   Monocytes Relative 6 %   Monocytes Absolute 0.5 0.1 - 1.0 K/uL   Eosinophils Relative 2 %   Eosinophils Absolute 0.2 0.0 - 0.5 K/uL   Basophils Relative 1 %   Basophils Absolute 0.1 0.0 - 0.1 K/uL   Immature Granulocytes 0 %   Abs Immature Granulocytes 0.04 0.00 - 0.07 K/uL    Comment: Performed at [facility], [address], [place], [place] [address]  Protime-INR     Status: Abnormal   Collection Time: 12/30/17  8:13 AM  Result Value Ref Range   Prothrombin Time 28.2 (H) 11.4 - 15.2 seconds   INR 2.69     Comment: Performed at [facility], [address], [place], [place] [address]  Type and screen [facility]     Status: None   Collection Time: 12/30/17  8:13 AM  Result Value Ref Range   ABO/RH(D) O POS    Antibody Screen NEG    Sample Expiration      01/02/2018 Performed at [facility], [address], [place], [place] [address]   POC occult blood, ED Provider will collect     Status: Abnormal   Collection Time: 12/30/17  8:21 AM  Result Value Ref Range   Fecal Occult Bld POSITIVE (A) NEGATIVE    Ct Abdomen Pelvis W Contrast  Result Date: 12/30/2017 CLINICAL DATA:  Intermittent rectal bleeding. History of prostate carcinoma. EXAM: CT ABDOMEN AND PELVIS WITH CONTRAST TECHNIQUE: Multidetector CT imaging of the abdomen and pelvis was performed using the standard protocol following bolus administration of intravenous contrast. CONTRAST:  138m ISOVUE-300 IOPAMIDOL (ISOVUE-300) INJECTION 61% COMPARISON:  None. FINDINGS: Lower chest: There is scarring in the left lung base region. No lung base edema or consolidation. Hepatobiliary: No focal liver lesions are appreciable. There are small gallstones in the gallbladder. The  gallbladder wall does not appear appreciably thickened. There is no biliary duct dilatation. Pancreas: There is no evident pancreatic mass or inflammatory focus. Spleen: There is a cyst arising from the anterior spleen measuring 1.1 x 1.0 cm. No other splenic lesions are evident. Adrenals/Urinary  Tract: There is a stable nodular opacity in the anterior right adrenal measuring 1.0 x 0.7 cm. Adrenals bilaterally otherwise appear normal. Kidneys bilaterally show no evident mass or hydronephrosis on either side. There is no renal or ureteral calculus on either side. Urinary bladder is midline with wall thickness within normal limits. Stomach/Bowel: There is mild rectal wall thickening. No fistula or perirectal stranding evident. No bowel wall thickening is noted elsewhere. Note that there is a midline lower abdominal wall hernia which contains loops of small and large bowel without bowel compromise evident. There is no demonstrable bowel obstruction. There is no free air or portal venous air. Vascular/Lymphatic: There is aortic and iliac artery atherosclerosis. No aneurysm evident. Major mesenteric arterial vessels are patent. There is no appreciable adenopathy in the abdomen or pelvis. Reproductive: Seed implants are noted in the prostate. The prostate and seminal vesicles are normal in size and contour. No pelvic mass is demonstrated on this study. Other: Appendix appears normal. No abscess or ascites is evident in the abdomen or pelvis. There is a ventral hernia in the lower abdomen which contains bowel. The neck of the hernia from right to left dimension measures 7.7 cm. Superior to inferior dimension of the hernia measures 4.5 cm. Musculoskeletal: There are no blastic or lytic bone lesions. There is no intramuscular lesion. IMPRESSION: 1. Wall thickening in the rectum, felt to represent a degree of proctitis. No perirectal stranding or abnormal fluid. Question proctitis as cause for rectal bleeding. Given rectal  bleeding, direct visualization of the colon is felt to be warranted after appropriate colonic cleansing. 2. Lower abdominal ventral hernia which contains loops of small and large bowel without bowel compromise. 3.  Cholelithiasis. 4. No bowel obstruction. No abscess in the abdomen or pelvis. Appendix appears normal. 5.  Status post seed implant therapy for prostate carcinoma. 6.  No renal or ureteral calculus.  No hydronephrosis. 7.  Stable small nodular opacity in the right adrenal. Electronically Signed   By: Lowella Grip III M.D.   On: 12/30/2017 10:16    ROS Blood pressure (!) 156/89, pulse 94, temperature 98 F (36.7 C), temperature source Oral, resp. rate 18, height 6' 2" (1.88 m), weight 136.1 kg, SpO2 99 %. Physical Exam Alert and oriented. Skin warm and dry. Oral mucosa is moist.   . Sclera anicteric, conjunctivae is pink. Thyroid not enlarged. No cervical lymphadenopathy. Lungs clear. Heart regular rate and rhythm.  Abdomen is soft. Bowel sounds are positive. No hepatomegaly. No abdominal masses felt. No tenderness.  No edema to lower extremities      Assessment/Plan: Rectal bleeding, probably hemorrhoidal. I discussed with Dr. Laural Golden. Sigmoidoscopy.   Helen Winterhalter L Jd Mccaster 12/30/2017, 11:09 AM

## 2017-12-30 NOTE — Progress Notes (Signed)
Brief flexible sigmoidoscopy note.  Examination performed to 50 cm. Active bleeding noted from Dieulafoy lesion at distal rectum. Bleeding controlled with hemospray.

## 2017-12-30 NOTE — ED Provider Notes (Signed)
[facility] EMERGENCY DEPARTMENT Provider Note   CSN: [id number] Arrival date & time: 12/30/17  0744     History   Chief Complaint Chief Complaint  Patient presents with  . Rectal Bleeding    HPI Dale Franklin is a 57 y.o. male.  Dale Franklin is a 57 y.o. Male with a history of hypertension, diabetes, COPD, A. fib currently anticoagulated on Coumadin, prostate cancer, and bowel obstruction, who presents to the emergency department for evaluation of rectal bleeding.  Patient reports for the past 2 weeks he has been noticing blood intermittently in his stools and when he woke up this morning he passed 2 large clots and has continued to have rectal bleeding through his close and into his car seat while on the way to the hospital.  He denies any pain in the abdomen or rectum.  Denies any nausea or vomiting.  No fevers or chills.  He is currently on Coumadin 12.5 daily for A. Fib.  Is followed by Dr. Laural Golden with GI.  Was recently admitted back in October for an episode of GI bleeding as well, last colonoscopy was in September and showed a few polyps and hemorrhoids.  Patient currently denies any lightheadedness or syncope, no chest pain, palpitations or shortness of breath.      Past Medical History:  Diagnosis Date  . Arthritis   . Bowel obstruction ([place])   . COPD (chronic obstructive pulmonary disease) ([place])   . Depression   . Diabetes mellitus without complication ([place])   . Dysrhythmia    A flutter  . Hypertension   . Prostate cancer [facility])     Patient Active Problem List   Diagnosis Date Noted  . Acute GI bleeding 11/19/2017  . Diabetes mellitus type II, non insulin dependent ([place]) 11/19/2017  . Rectal bleeding 10/18/2017  . Obesity (BMI 30-39.9)   . Atrial fibrillation, chronic   . Tobacco abuse 07/21/2017  . COPD (chronic obstructive pulmonary disease) ([place])   . Depression   . Diabetes mellitus without complication ([place])   . Hypertension   . Bladder outlet  obstruction 06/01/2016  . Malignant neoplasm of prostate ([place]) 05/31/2016  . Prostate cancer ([place]) 05/11/2016    Past Surgical History:  Procedure Laterality Date  . COLONOSCOPY WITH PROPOFOL N/A 11/04/2017   Procedure: COLONOSCOPY WITH PROPOFOL;  Surgeon: Rogene Houston, MD;  Location: [facility] ENDO SUITE;  Service: Endoscopy;  Laterality: N/A;  2:25  . GOLD SEED IMPLANT N/A 05/31/2016   Procedure: GOLD SEED IMPLANT;  Surgeon: Franchot Gallo, MD;  Location: [facility] ORS;  Service: Urology;  Laterality: N/A;  . HAND SURGERY Right   . HERNIA REPAIR    . POLYPECTOMY  11/04/2017   Procedure: POLYPECTOMY;  Surgeon: Rogene Houston, MD;  Location: [facility] ENDO SUITE;  Service: Endoscopy;;  cecal polyp (CSx1), transverse colon (CSx2),recto-sigmoid(CSx1)  . TRANSURETHRAL RESECTION OF PROSTATE N/A 05/31/2016   Procedure: TRANSURETHRAL RESECTION OF THE PROSTATE (TURP);  Surgeon: Franchot Gallo, MD;  Location: [facility] ORS;  Service: Urology;  Laterality: N/A;  . WRIST SURGERY Right         Home Medications    Prior to Admission medications   Medication Sig Start Date End Date Taking? Authorizing Provider  albuterol (PROVENTIL HFA;VENTOLIN HFA) 108 (90 Base) MCG/ACT inhaler Inhale 1-2 puffs into the lungs every 6 (six) hours as needed for wheezing or shortness of breath.    [provider]  docusate sodium (COLACE) 100 MG capsule Take 2 capsules (200 mg total) by  mouth daily. 11/20/17 11/20/18  Barton Dubois, MD  DULoxetine (CYMBALTA) 30 MG capsule Take 90 mg by mouth daily.     [provider]  glipiZIDE (GLUCOTROL) 10 MG tablet Take 10 mg by mouth 2 (two) times daily.    [provider]  leuprolide (LUPRON) 11.25 MG injection Inject 11.25 mg into the muscle every 4 (four) months.     [provider]  lisinopril-hydrochlorothiazide (PRINZIDE,ZESTORETIC) 10-12.5 MG tablet Take 1 tablet by mouth daily.     [provider]  metFORMIN (GLUCOPHAGE-XR) 500 MG 24 hr tablet  Take 500 mg by mouth 2 (two) times daily.     [provider]  metoprolol succinate (TOPROL-XL) 25 MG 24 hr tablet Take 25 mg by mouth 2 (two) times daily.     [provider]  morphine (MSIR) 30 MG tablet Take 30 mg by mouth every 8 (eight) hours as needed for pain. 10/13/17   [provider]  oxybutynin (DITROPAN) 5 MG tablet Take 5 mg by mouth 3 (three) times daily.     [provider]  psyllium (METAMUCIL MULTIHEALTH FIBER) 58.6 % powder Take 2 packets by mouth daily. 11/20/17   Barton Dubois, MD  simvastatin (ZOCOR) 10 MG tablet Take 10 mg by mouth at bedtime. 06/30/17   [provider]  traZODone (DESYREL) 50 MG tablet Take 50 mg by mouth at bedtime.    [provider]  warfarin (COUMADIN) 10 MG tablet Take 1 tablet (10 mg total) by mouth every evening. Hold until 11/24/17 11/20/17   Barton Dubois, MD  warfarin (COUMADIN) 2.5 MG tablet Take 1 tablet (2.5 mg total) by mouth at bedtime. Hold until 10/10 11/20/17   Barton Dubois, MD    Family History Family History  Problem Relation Age of Onset  . Diabetes Mother   . Cancer Father   . Cancer Paternal Uncle        prostate    Social History Social History   Tobacco Use  . Smoking status: Current Every Day Smoker    Packs/day: 0.50    Types: Cigarettes  . Smokeless tobacco: Never Used  Substance Use Topics  . Alcohol use: Not Currently    Frequency: Never    Comment: occasionally  . Drug use: No     Allergies   Patient has no known allergies.   Review of Systems Review of Systems  Constitutional: Negative for chills and fever.  HENT: Negative.   Eyes: Negative for visual disturbance.  Respiratory: Negative for cough and shortness of breath.   Cardiovascular: Negative for chest pain.  Gastrointestinal: Positive for anal bleeding and blood in stool. Negative for abdominal distention, abdominal pain, constipation, diarrhea, nausea, rectal pain and vomiting.    Genitourinary: Negative for dysuria and frequency.  Musculoskeletal: Negative for arthralgias, back pain and myalgias.  Skin: Negative for color change, pallor and rash.  Neurological: Negative for dizziness, syncope and light-headedness.     Physical Exam Updated Vital Signs BP (!) 156/89 (BP Location: Right Arm)   Pulse 94   Temp 98 F (36.7 C) (Oral)   Resp 18   Ht 6\' 2"  (1.88 m)   Wt 136.1 kg   SpO2 99%   BMI 38.52 kg/m   Physical Exam  Constitutional: He is oriented to person, place, and time. He appears well-developed and well-nourished. No distress.  HENT:  Head: Normocephalic and atraumatic.  Mouth/Throat: Oropharynx is clear and moist.  Eyes: Right eye exhibits no discharge. Left eye exhibits  no discharge.  Neck: Neck supple.  Cardiovascular: Normal rate, regular rhythm, normal heart sounds and intact distal pulses. Exam reveals no gallop and no friction rub.  No murmur heard. Pulmonary/Chest: Effort normal and breath sounds normal. No respiratory distress.  Respirations equal and unlabored, patient able to speak in full sentences, lungs clear to auscultation bilaterally  Abdominal: Soft. Bowel sounds are normal. He exhibits no distension and no mass. There is tenderness. There is no guarding.  Tenderness soft, nondistended, bowel sounds are present throughout, there is some tenderness across the lower abdomen, worse in the right lower quadrant and left without focal guarding, no rebound tenderness or rigidity, no CVA tenderness bilaterally  Genitourinary:  Genitourinary Comments: Reveals blood mixed with stool, there is no active large-volume bleeding, no pain or palpable masses on rectal exam  Musculoskeletal: He exhibits no deformity.  Neurological: He is alert and oriented to person, place, and time. Coordination normal.  Skin: Skin is warm and dry. Capillary refill takes less than 2 seconds. He is not diaphoretic.  Psychiatric: He has a normal mood and affect.  His behavior is normal.  Nursing note and vitals reviewed.    ED Treatments / Results  Labs (all labs ordered are listed, but only abnormal results are displayed) Labs Reviewed  COMPREHENSIVE METABOLIC PANEL - Abnormal; Notable for the following components:      Result Value   Glucose, Bld 121 (*)    Calcium 8.7 (*)    All other components within normal limits  CBC WITH DIFFERENTIAL/PLATELET - Abnormal; Notable for the following components:   Hemoglobin 12.1 (*)    HCT 38.7 (*)    All other components within normal limits  PROTIME-INR - Abnormal; Notable for the following components:   Prothrombin Time 28.2 (*)    All other components within normal limits  POC OCCULT BLOOD, ED - Abnormal; Notable for the following components:   Fecal Occult Bld POSITIVE (*)    All other components within normal limits  TYPE AND SCREEN    EKG None  Radiology Ct Abdomen Pelvis W Contrast  Result Date: 12/30/2017 CLINICAL DATA:  Intermittent rectal bleeding. History of prostate carcinoma. EXAM: CT ABDOMEN AND PELVIS WITH CONTRAST TECHNIQUE: Multidetector CT imaging of the abdomen and pelvis was performed using the standard protocol following bolus administration of intravenous contrast. CONTRAST:  149mL ISOVUE-300 IOPAMIDOL (ISOVUE-300) INJECTION 61% COMPARISON:  None. FINDINGS: Lower chest: There is scarring in the left lung base region. No lung base edema or consolidation. Hepatobiliary: No focal liver lesions are appreciable. There are small gallstones in the gallbladder. The gallbladder wall does not appear appreciably thickened. There is no biliary duct dilatation. Pancreas: There is no evident pancreatic mass or inflammatory focus. Spleen: There is a cyst arising from the anterior spleen measuring 1.1 x 1.0 cm. No other splenic lesions are evident. Adrenals/Urinary Tract: There is a stable nodular opacity in the anterior right adrenal measuring 1.0 x 0.7 cm. Adrenals bilaterally otherwise appear  normal. Kidneys bilaterally show no evident mass or hydronephrosis on either side. There is no renal or ureteral calculus on either side. Urinary bladder is midline with wall thickness within normal limits. Stomach/Bowel: There is mild rectal wall thickening. No fistula or perirectal stranding evident. No bowel wall thickening is noted elsewhere. Note that there is a midline lower abdominal wall hernia which contains loops of small and large bowel without bowel compromise evident. There is no demonstrable bowel obstruction. There is no free air or portal venous air.  Vascular/Lymphatic: There is aortic and iliac artery atherosclerosis. No aneurysm evident. Major mesenteric arterial vessels are patent. There is no appreciable adenopathy in the abdomen or pelvis. Reproductive: Seed implants are noted in the prostate. The prostate and seminal vesicles are normal in size and contour. No pelvic mass is demonstrated on this study. Other: Appendix appears normal. No abscess or ascites is evident in the abdomen or pelvis. There is a ventral hernia in the lower abdomen which contains bowel. The neck of the hernia from right to left dimension measures 7.7 cm. Superior to inferior dimension of the hernia measures 4.5 cm. Musculoskeletal: There are no blastic or lytic bone lesions. There is no intramuscular lesion. IMPRESSION: 1. Wall thickening in the rectum, felt to represent a degree of proctitis. No perirectal stranding or abnormal fluid. Question proctitis as cause for rectal bleeding. Given rectal bleeding, direct visualization of the colon is felt to be warranted after appropriate colonic cleansing. 2. Lower abdominal ventral hernia which contains loops of small and large bowel without bowel compromise. 3.  Cholelithiasis. 4. No bowel obstruction. No abscess in the abdomen or pelvis. Appendix appears normal. 5.  Status post seed implant therapy for prostate carcinoma. 6.  No renal or ureteral calculus.  No hydronephrosis.  7.  Stable small nodular opacity in the right adrenal. Electronically Signed   By: Lowella Grip III M.D.   On: 12/30/2017 10:16    Procedures .Critical Care Performed by: Jacqlyn Larsen, PA-C Authorized by: Jacqlyn Larsen, PA-C   Critical care provider statement:    Critical care time (minutes):  45   Critical care time was exclusive of:  Separately billable procedures and treating other patients   Critical care was necessary to treat or prevent imminent or life-threatening deterioration of the following conditions: GI bleed on coumadin.   Critical care was time spent personally by me on the following activities:  Discussions with consultants, evaluation of patient's response to treatment, examination of patient, ordering and performing treatments and interventions, ordering and review of laboratory studies, ordering and review of radiographic studies, pulse oximetry, re-evaluation of patient's condition, obtaining history from patient or surrogate and review of old charts   I assumed direction of critical care for this patient from another provider in my specialty: no     (including critical care time)  Medications Ordered in ED Medications  ciprofloxacin (CIPRO) IVPB 400 mg (has no administration in time range)  metroNIDAZOLE (FLAGYL) IVPB 500 mg (has no administration in time range)  sodium chloride 0.9 % bolus 1,000 mL (1,000 mLs Intravenous New Bag/Given 12/30/17 0832)  iopamidol (ISOVUE-300) 61 % injection 100 mL (100 mLs Intravenous Contrast Given 12/30/17 0956)     Initial Impression / Assessment and Plan / ED Course  I have reviewed the triage vital signs and the nursing notes.  Pertinent labs & imaging results that were available during my care of the patient were reviewed by me and considered in my medical decision making (see chart for details).  Patient presents with episode of GI bleeding this morning reports he passed 2 large clots and then passed large amount of  bright red blood per rectum this morning, has been having intermittent blood in his stools over the past 2 weeks.  Is currently on Coumadin for A. fib.  Has some mild lower abdominal tenderness on exam, he is hemodynamically stable with normal vitals on arrival.  Will get abdominal labs, type and screen and a CT abdomen pelvis given tenderness and  history of prostate cancer.  Patient was admitted in October with similar symptoms and bleeding was thought to potentially be due to recent colonoscopy but we are now almost 2 months out from patient's colonoscopy and he continues to have bleeding symptoms.  Labs significant for hemoglobin of 12.1, per GI notes patient's baseline hemoglobin is about 15, no leukocytosis.  INR is therapeutic at 2.69, Hemoccult was grossly positive with no active bleeding on rectal exam but blood mixed stools present in the rectal vault.  INR is therapeutic, but given the patient has not had any additional episodes of bleeding has remained hemodynamically stable with stable hemoglobin hold off on the any anticoagulation reversal at this time.  8:42 AM discussed with Dr. Laural Golden who recommends medicine admission and he will see the patient in consult, patient may need surgical consult for hemorrhoids if no other cause of patient's bleeding can be determined.  Return and show signs of proctitis which could certainly be causing patient's bleeding symptoms, will start patient on Cipro and Flagyl.  Will consult hospitalist for admission for GI bleeding while on blood thinners.  GI to consult.  Discussed with Dr. Bonner Puna with [facility] hospitalist who will see and admit the patient.  Final Clinical Impressions(s) / ED Diagnoses   Final diagnoses:  Lower GI bleed  Proctitis    ED Discharge Orders    None       Jacqlyn Larsen, [place] 12/30/17 1034    Francine Graven, DO 01/02/18 2214

## 2017-12-30 NOTE — Progress Notes (Signed)
Pharmacy Antibiotic Note  Kolbi Altadonna is a 57 y.o. male admitted on 12/30/2017 with intra-abdominal infection.  Pharmacy has been consulted for Ciprofloxacin dosing.  Plan: Ciprofloxacin 400 mg IV every 12 hours  Monitor labs, c/s, and patient improvement.  Height: 6\' 2"  (188 cm) Weight: 300 lb (136.1 kg) IBW/kg (Calculated) : 82.2  Temp (24hrs), Avg:98 F (36.7 C), Min:98 F (36.7 C), Max:98 F (36.7 C)  Recent Labs  Lab 12/30/17 0813  WBC 9.6  CREATININE 0.79    Estimated Creatinine Clearance: 149.6 mL/min (by C-G formula based on SCr of 0.79 mg/dL).    No Known Allergies  Antimicrobials this admission: Cipro 11/15 >>  Flagyl 11/15 >>   Dose adjustments this admission: N/A  Microbiology results: None pending  Thank you for allowing pharmacy to be a part of this patient's care.  Ramond Craver 12/30/2017 10:49 AM

## 2017-12-31 DIAGNOSIS — K922 Gastrointestinal hemorrhage, unspecified: Secondary | ICD-10-CM

## 2017-12-31 DIAGNOSIS — G8929 Other chronic pain: Secondary | ICD-10-CM

## 2017-12-31 DIAGNOSIS — Z720 Tobacco use: Secondary | ICD-10-CM

## 2017-12-31 DIAGNOSIS — E669 Obesity, unspecified: Secondary | ICD-10-CM

## 2017-12-31 DIAGNOSIS — I482 Chronic atrial fibrillation, unspecified: Secondary | ICD-10-CM | POA: Diagnosis not present

## 2017-12-31 DIAGNOSIS — M25512 Pain in left shoulder: Secondary | ICD-10-CM

## 2017-12-31 DIAGNOSIS — J449 Chronic obstructive pulmonary disease, unspecified: Secondary | ICD-10-CM

## 2017-12-31 DIAGNOSIS — K625 Hemorrhage of anus and rectum: Secondary | ICD-10-CM | POA: Diagnosis not present

## 2017-12-31 DIAGNOSIS — K529 Noninfective gastroenteritis and colitis, unspecified: Secondary | ICD-10-CM | POA: Diagnosis not present

## 2017-12-31 DIAGNOSIS — C61 Malignant neoplasm of prostate: Secondary | ICD-10-CM

## 2017-12-31 DIAGNOSIS — I10 Essential (primary) hypertension: Secondary | ICD-10-CM

## 2017-12-31 DIAGNOSIS — E119 Type 2 diabetes mellitus without complications: Secondary | ICD-10-CM

## 2017-12-31 LAB — GLUCOSE, CAPILLARY
GLUCOSE-CAPILLARY: 143 mg/dL — AB (ref 70–99)
GLUCOSE-CAPILLARY: 146 mg/dL — AB (ref 70–99)
GLUCOSE-CAPILLARY: 148 mg/dL — AB (ref 70–99)
Glucose-Capillary: 162 mg/dL — ABNORMAL HIGH (ref 70–99)
Glucose-Capillary: 173 mg/dL — ABNORMAL HIGH (ref 70–99)
Glucose-Capillary: 130 mg/dL — ABNORMAL HIGH (ref 70–99)

## 2017-12-31 LAB — CBC
HCT: 36 % — ABNORMAL LOW (ref 39.0–52.0)
Hemoglobin: 11 g/dL — ABNORMAL LOW (ref 13.0–17.0)
MCH: 26.8 pg (ref 26.0–34.0)
MCHC: 30.6 g/dL (ref 30.0–36.0)
MCV: 87.6 fL (ref 80.0–100.0)
Platelets: 143 10*3/uL — ABNORMAL LOW (ref 150–400)
RBC: 4.11 MIL/uL — ABNORMAL LOW (ref 4.22–5.81)
RDW: 15.3 % (ref 11.5–15.5)
WBC: 6.3 10*3/uL (ref 4.0–10.5)
nRBC: 0 % (ref 0.0–0.2)

## 2017-12-31 LAB — HEMOGLOBIN: Hemoglobin: 11 g/dL — ABNORMAL LOW (ref 13.0–17.0)

## 2017-12-31 LAB — PROTIME-INR
INR: 2.74
Prothrombin Time: 28.6 seconds — ABNORMAL HIGH (ref 11.4–15.2)

## 2017-12-31 MED ORDER — CIPROFLOXACIN HCL 250 MG PO TABS
500.0000 mg | ORAL_TABLET | Freq: Two times a day (BID) | ORAL | Status: DC
  Administered 2017-12-31 – 2018-01-01 (×2): 500 mg via ORAL
  Filled 2017-12-31 (×2): qty 2

## 2017-12-31 MED ORDER — METRONIDAZOLE 500 MG PO TABS
500.0000 mg | ORAL_TABLET | Freq: Three times a day (TID) | ORAL | Status: DC
  Administered 2017-12-31 – 2018-01-01 (×2): 500 mg via ORAL
  Filled 2017-12-31 (×2): qty 1

## 2017-12-31 NOTE — Progress Notes (Signed)
Patient requesting medication and vitals early so he can go to sleep.

## 2017-12-31 NOTE — Progress Notes (Signed)
Feels well this afternoon.  No abdominal pain.  Tolerating a regular diet. No blood per rectum/no bowel movement today.  Vital signs in last 24 hours: Temp:  [97.7 F (36.5 C)-98.1 F (36.7 C)] 97.9 F (36.6 C) (11/16 1401) Pulse Rate:  [71-100] 71 (11/16 1401) Resp:  [12-19] 19 (11/16 1401) BP: (97-144)/(58-93) 110/58 (11/16 1401) SpO2:  [96 %-100 %] 99 % (11/16 1401) Last BM Date: 12/30/17 General:   Alert,   pleasant and cooperative in NAD Abdomen: Obese.  Positive bowel sounds soft nontender without appreciable mass organomegaly. Extremities:  Without clubbing or edema.    Intake/Output from previous day: 11/15 0701 - 11/16 0700 In: 1700 [IV Piggyback:1700] Out: -  Intake/Output this shift: No intake/output data recorded.  Lab Results: Recent Labs    12/30/17 0813  12/30/17 1954 12/31/17 0026 12/31/17 0627  WBC 9.6  --   --   --  6.3  HGB 12.1*   < > 11.1* 11.0* 11.0*  HCT 38.7*  --   --   --  36.0*  PLT 171  --   --   --  143*   < > = values in this interval not displayed.   BMET Recent Labs    12/30/17 0813  NA 136  K 4.0  CL 105  CO2 23  GLUCOSE 121*  BUN 18  CREATININE 0.79  CALCIUM 8.7*   LFT Recent Labs    12/30/17 0813  PROT 6.6  ALBUMIN 3.5  AST 17  ALT 14  ALKPHOS 88  BILITOT 0.7   PT/INR Recent Labs    12/30/17 0813 12/31/17 0627  LABPROT 28.2* 28.6*  INR 2.69 2.74   Hepatitis Panel No results for input(s): HEPBSAG, HCVAB, HEPAIGM, HEPBIGM in the last 72 hours. C-Diff No results for input(s): CDIFFTOX in the last 72 hours.  Studies/Results: Ct Abdomen Pelvis W Contrast  Result Date: 12/30/2017 CLINICAL DATA:  Intermittent rectal bleeding. History of prostate carcinoma. EXAM: CT ABDOMEN AND PELVIS WITH CONTRAST TECHNIQUE: Multidetector CT imaging of the abdomen and pelvis was performed using the standard protocol following bolus administration of intravenous contrast. CONTRAST:  185mL ISOVUE-300 IOPAMIDOL (ISOVUE-300)  INJECTION 61% COMPARISON:  None. FINDINGS: Lower chest: There is scarring in the left lung base region. No lung base edema or consolidation. Hepatobiliary: No focal liver lesions are appreciable. There are small gallstones in the gallbladder. The gallbladder wall does not appear appreciably thickened. There is no biliary duct dilatation. Pancreas: There is no evident pancreatic mass or inflammatory focus. Spleen: There is a cyst arising from the anterior spleen measuring 1.1 x 1.0 cm. No other splenic lesions are evident. Adrenals/Urinary Tract: There is a stable nodular opacity in the anterior right adrenal measuring 1.0 x 0.7 cm. Adrenals bilaterally otherwise appear normal. Kidneys bilaterally show no evident mass or hydronephrosis on either side. There is no renal or ureteral calculus on either side. Urinary bladder is midline with wall thickness within normal limits. Stomach/Bowel: There is mild rectal wall thickening. No fistula or perirectal stranding evident. No bowel wall thickening is noted elsewhere. Note that there is a midline lower abdominal wall hernia which contains loops of small and large bowel without bowel compromise evident. There is no demonstrable bowel obstruction. There is no free air or portal venous air. Vascular/Lymphatic: There is aortic and iliac artery atherosclerosis. No aneurysm evident. Major mesenteric arterial vessels are patent. There is no appreciable adenopathy in the abdomen or pelvis. Reproductive: Seed implants are noted in the prostate. The  prostate and seminal vesicles are normal in size and contour. No pelvic mass is demonstrated on this study. Other: Appendix appears normal. No abscess or ascites is evident in the abdomen or pelvis. There is a ventral hernia in the lower abdomen which contains bowel. The neck of the hernia from right to left dimension measures 7.7 cm. Superior to inferior dimension of the hernia measures 4.5 cm. Musculoskeletal: There are no blastic or  lytic bone lesions. There is no intramuscular lesion. IMPRESSION: 1. Wall thickening in the rectum, felt to represent a degree of proctitis. No perirectal stranding or abnormal fluid. Question proctitis as cause for rectal bleeding. Given rectal bleeding, direct visualization of the colon is felt to be warranted after appropriate colonic cleansing. 2. Lower abdominal ventral hernia which contains loops of small and large bowel without bowel compromise. 3.  Cholelithiasis. 4. No bowel obstruction. No abscess in the abdomen or pelvis. Appendix appears normal. 5.  Status post seed implant therapy for prostate carcinoma. 6.  No renal or ureteral calculus.  No hydronephrosis. 7.  Stable small nodular opacity in the right adrenal. Electronically Signed   By: Lowella Grip III M.D.   On: 12/30/2017 10:16   Impression: Lower GI bleed secondary to rectal telangiectasias-likely related to radiation and clinically, bleeding has ceased.  He may or may not need further endoscopic therapy over the short run.  INR remains elevated.  Recommendations: If he continues to do well, could probably be discharged in the next 24 hours.  Since INR still elevated, would hold off on resuming Coumadin until November 20.  Add Colace daily to avoid passage of hard stools.  Follow-up with Dr. Laural Golden to be arranged.

## 2017-12-31 NOTE — Progress Notes (Signed)
PROGRESS NOTE  Dale Franklin  YFV:[id number] DOB: Nov 04, 1960 DOA: 12/30/2017 PCP: Karsten Ro, DO   Brief Narrative: Dale Franklin is a 57 y.o. male with a history of prostate CA s/p radiation seeds on lupron, AFib on coumadin, T2DM, HTN, HLD, and rectal bleeding who resturned to the ED after passing a large amount of red blood per rectum containing clots. He was hemodynamically stable, hypertensive, with LLQ tenderness, +rectal blood on exam. Hgb 12.1, up from previous discharge on 10/6 which was 11.7g/dl. INR therapeutic. CT abdomen and pelvis demonstrated rectal wall thickening without other acute findings. Type and screen sent, cipro and flagyl started, GI consulted and patient underwent sigmoidoscopy 11/15 which showed actively bleeding telangiectasia which was treated with hemospray, not cautery. GI recommended vitamin K and monitoring CBC, INR for 48 hours.  Assessment & Plan: Principal Problem:   Proctocolitis with rectal bleeding Active Problems:   Malignant neoplasm of prostate (HCC)   COPD (chronic obstructive pulmonary disease) (HCC)   Depression   Diabetes mellitus without complication (HCC)   Hypertension   Tobacco abuse   Obesity (BMI 30-39.9)   Atrial fibrillation, chronic   Rectal bleeding   Acute GI bleeding   Chronic left shoulder pain  Proctitis, bleeding rectal telangiectasia causing acute on chronic blood loss anemia: - Continue cipro/flagyl for now, though ?if this is necessary. Will discuss with GI. - Advance to soft diet today.  - Hgb stable at 11, presumed baseline and no current active bleeding. Will check again if bleeding noted or in AM.  - Hold anticoagulation for now. INR is therapeutic, vitamin K x1 yesterday, continue monitoring.  AFib: Stable, rate controlled. - CHA2DS2-VASc Scoreis 2 due to HTN and DM. In the setting of recurrent GI bleeding, ?whether aspirin would be an acceptable alternative. Would have to discuss with patient prior to any  changes.  - Hold blood thinners for now. - Continue metoprolol  T2DM:  - Will hold metformin and [facility] given recent contrast, cover with SSI AC/HS  HTN:  - Restart HCTZ and metoprolol, holding lisinopril with soft BP and contrast exposure.  Prostate CA s/p TURP: ?radiation proctitis.  - Continue oxybutynin, no recent BOO, LUTS  Chronic left shoulder pain: Under care of pain management.  - Continue home morphine 30mg  TID, cymbalta  COPD: No flare - continue prn albuterol  Hyperlipidemia:  - Continue statin  Insomnia:  - Continue trazodone  Tobacco use:  - Cessation counseling provided - Nicotine patch ordered  DVT prophylaxis: SCDs Code Status: Full Family Communication: None at bedside Disposition Plan: Home when hemoglobin stable and no bleeding, per GI earliest would be 11/17.  Consultants:   GI, Dr. Laural Golden  Procedures:   Flexible sigmoidoscopy 12/30/2017, Dr. Laural Golden:  Findings:      The perianal and digital rectal examinations were normal.      The sigmoid colon appeared normal.      Red blood was found in the rectum.      Active bleeding from telangiectasia, To stop active bleeding, hemostatic       spray was deployed. A single spray was applied. There was no bleeding at       the end of the procedure. Impression:       - The sigmoid colon is normal.                           - Active bleeding from rectal telangiectasia  controlled with Hemospray.                           - No specimens collected. Recommendation: Return patient to hospital ward for ongoing care.                           - Full liquid diet today.                           - Continue present medications.                           - Vit K 2 mg SQx 1                           - CBC and INR in am.  Antimicrobials:  Cipro, flagyl   Subjective: No bleeding overnight, no abdominal pain or fever. No BM. No dyspnea or chest pain or  palpitations.  Objective: Vitals:   12/30/17 1850 12/30/17 2011 12/31/17 0522 12/31/17 1401  BP:  (!) 132/93 97/73 (!) 110/58  Pulse:  74 88 71  Resp:  19  19  Temp:  98.1 F (36.7 C) 97.7 F (36.5 C) 97.9 F (36.6 C)  TempSrc:  Oral Oral Oral  SpO2: 100% 99% 96% 99%  Weight:      Height:        Intake/Output Summary (Last 24 hours) at 12/31/2017 1443 Last data filed at 12/31/2017 0330 Gross per 24 hour  Intake 700 ml  Output -  Net 700 ml   Filed Weights   12/30/17 0748 12/30/17 1226  Weight: 136.1 kg (!) 137.9 kg    Gen: Obese male in no distress  Pulm: Non-labored breathing room air. Clear to auscultation bilaterally.  CV: Regular rate and rhythm. No murmur, rub, or gallop. No JVD, no pedal edema. GI: Abdomen soft, not significantly tender, non-distended, with normoactive bowel sounds. No organomegaly or masses felt. Ext: Warm, no deformities Skin: No rashes, lesions or ulcers Neuro: Alert and oriented. No focal neurological deficits. Psych: Judgement and insight appear normal. Mood & affect appropriate.   Data Reviewed: I have personally reviewed following labs and imaging studies  CBC: Recent Labs  Lab 12/30/17 0813 12/30/17 1233 12/30/17 1954 12/31/17 0026 12/31/17 0627  WBC 9.6  --   --   --  6.3  NEUTROABS 6.1  --   --   --   --   HGB 12.1* 11.7* 11.1* 11.0* 11.0*  HCT 38.7*  --   --   --  36.0*  MCV 84.5  --   --   --  87.6  PLT 171  --   --   --  540*   Basic Metabolic Panel: Recent Labs  Lab 12/30/17 0813  NA 136  K 4.0  CL 105  CO2 23  GLUCOSE 121*  BUN 18  CREATININE 0.79  CALCIUM 8.7*   GFR: Estimated Creatinine Clearance: 150.6 mL/min (by C-G formula based on SCr of 0.79 mg/dL). Liver Function Tests: Recent Labs  Lab 12/30/17 0813  AST 17  ALT 14  ALKPHOS 88  BILITOT 0.7  PROT 6.6  ALBUMIN 3.5   No results for input(s): LIPASE, AMYLASE in the last 168 hours. No results for input(s): AMMONIA in the last 168  hours.  Coagulation Profile: Recent Labs  Lab 12/30/17 0813 12/31/17 0627  INR 2.69 2.74   Cardiac Enzymes: No results for input(s): CKTOTAL, CKMB, CKMBINDEX, TROPONINI in the last 168 hours. BNP (last 3 results) No results for input(s): PROBNP in the last 8760 hours. HbA1C: No results for input(s): HGBA1C in the last 72 hours. CBG: Recent Labs  Lab 12/30/17 2008 12/30/17 2315 12/31/17 0405 12/31/17 0751 12/31/17 1138  GLUCAP 89 82 143* 162* 173*   Lipid Profile: No results for input(s): CHOL, HDL, LDLCALC, TRIG, CHOLHDL, LDLDIRECT in the last 72 hours. Thyroid Function Tests: No results for input(s): TSH, T4TOTAL, FREET4, T3FREE, THYROIDAB in the last 72 hours. Anemia Panel: No results for input(s): VITAMINB12, FOLATE, FERRITIN, TIBC, IRON, RETICCTPCT in the last 72 hours. Urine analysis:    Component Value Date/Time   COLORURINE YELLOW 07/21/2017 2230   APPEARANCEUR HAZY (A) 07/21/2017 2230   LABSPEC 1.031 (H) 07/21/2017 2230   PHURINE 5.0 07/21/2017 2230   GLUCOSEU NEGATIVE 07/21/2017 2230   HGBUR NEGATIVE 07/21/2017 2230   BILIRUBINUR NEGATIVE 07/21/2017 2230   KETONESUR 5 (A) 07/21/2017 2230   PROTEINUR NEGATIVE 07/21/2017 2230   NITRITE NEGATIVE 07/21/2017 2230   LEUKOCYTESUR NEGATIVE 07/21/2017 2230   No results found for this or any previous visit (from the past 240 hour(s)).    Radiology Studies: Ct Abdomen Pelvis W Contrast  Result Date: 12/30/2017 CLINICAL DATA:  Intermittent rectal bleeding. History of prostate carcinoma. EXAM: CT ABDOMEN AND PELVIS WITH CONTRAST TECHNIQUE: Multidetector CT imaging of the abdomen and pelvis was performed using the standard protocol following bolus administration of intravenous contrast. CONTRAST:  133mL ISOVUE-300 IOPAMIDOL (ISOVUE-300) INJECTION 61% COMPARISON:  None. FINDINGS: Lower chest: There is scarring in the left lung base region. No lung base edema or consolidation. Hepatobiliary: No focal liver lesions are  appreciable. There are small gallstones in the gallbladder. The gallbladder wall does not appear appreciably thickened. There is no biliary duct dilatation. Pancreas: There is no evident pancreatic mass or inflammatory focus. Spleen: There is a cyst arising from the anterior spleen measuring 1.1 x 1.0 cm. No other splenic lesions are evident. Adrenals/Urinary Tract: There is a stable nodular opacity in the anterior right adrenal measuring 1.0 x 0.7 cm. Adrenals bilaterally otherwise appear normal. Kidneys bilaterally show no evident mass or hydronephrosis on either side. There is no renal or ureteral calculus on either side. Urinary bladder is midline with wall thickness within normal limits. Stomach/Bowel: There is mild rectal wall thickening. No fistula or perirectal stranding evident. No bowel wall thickening is noted elsewhere. Note that there is a midline lower abdominal wall hernia which contains loops of small and large bowel without bowel compromise evident. There is no demonstrable bowel obstruction. There is no free air or portal venous air. Vascular/Lymphatic: There is aortic and iliac artery atherosclerosis. No aneurysm evident. Major mesenteric arterial vessels are patent. There is no appreciable adenopathy in the abdomen or pelvis. Reproductive: Seed implants are noted in the prostate. The prostate and seminal vesicles are normal in size and contour. No pelvic mass is demonstrated on this study. Other: Appendix appears normal. No abscess or ascites is evident in the abdomen or pelvis. There is a ventral hernia in the lower abdomen which contains bowel. The neck of the hernia from right to left dimension measures 7.7 cm. Superior to inferior dimension of the hernia measures 4.5 cm. Musculoskeletal: There are no blastic or lytic bone lesions. There is no intramuscular lesion. IMPRESSION: 1. Wall thickening in the  rectum, felt to represent a degree of proctitis. No perirectal stranding or abnormal fluid.  Question proctitis as cause for rectal bleeding. Given rectal bleeding, direct visualization of the colon is felt to be warranted after appropriate colonic cleansing. 2. Lower abdominal ventral hernia which contains loops of small and large bowel without bowel compromise. 3.  Cholelithiasis. 4. No bowel obstruction. No abscess in the abdomen or pelvis. Appendix appears normal. 5.  Status post seed implant therapy for prostate carcinoma. 6.  No renal or ureteral calculus.  No hydronephrosis. 7.  Stable small nodular opacity in the right adrenal. Electronically Signed   By: Lowella Grip III M.D.   On: 12/30/2017 10:16    Scheduled Meds: . docusate sodium  100 mg Oral BID  . DULoxetine  90 mg Oral Daily  . hydrochlorothiazide  12.5 mg Oral Daily  . insulin aspart  0-9 Units Subcutaneous Q4H  . metoprolol succinate  25 mg Oral BID  . nicotine  14 mg Transdermal Daily  . oxybutynin  5 mg Oral TID  . simvastatin  10 mg Oral QHS  . sodium chloride flush  3 mL Intravenous Q12H  . traZODone  50 mg Oral QHS   Continuous Infusions: . ciprofloxacin 400 mg (12/31/17 1302)  . metronidazole 500 mg (12/31/17 1001)     LOS: 0 days   Time spent: 25 minutes.  Patrecia Pour, MD [facility] [email or web address] Password [facility] 12/31/2017, 2:43 PM

## 2017-12-31 NOTE — Progress Notes (Signed)
Patients IV infiltrated, unable to obtain access after multiple attempts by staff RN as well as AC.  MD made aware - OK to leave out for now, will transition medications to PO.  Patient has been tolerating PO well and has had no BM or bleeding.

## 2018-01-01 DIAGNOSIS — K529 Noninfective gastroenteritis and colitis, unspecified: Secondary | ICD-10-CM | POA: Diagnosis not present

## 2018-01-01 DIAGNOSIS — I482 Chronic atrial fibrillation, unspecified: Secondary | ICD-10-CM | POA: Diagnosis not present

## 2018-01-01 DIAGNOSIS — M25512 Pain in left shoulder: Secondary | ICD-10-CM | POA: Diagnosis not present

## 2018-01-01 DIAGNOSIS — K922 Gastrointestinal hemorrhage, unspecified: Secondary | ICD-10-CM | POA: Diagnosis not present

## 2018-01-01 LAB — BASIC METABOLIC PANEL
Anion gap: 5 (ref 5–15)
BUN: 17 mg/dL (ref 6–20)
CHLORIDE: 103 mmol/L (ref 98–111)
CO2: 27 mmol/L (ref 22–32)
CREATININE: 0.93 mg/dL (ref 0.61–1.24)
Calcium: 8.4 mg/dL — ABNORMAL LOW (ref 8.9–10.3)
GFR calc non Af Amer: >60 mL/min (ref >60)
GLUCOSE: 127 mg/dL — AB (ref 70–99)
Potassium: 3.8 mmol/L (ref 3.5–5.1)
Sodium: 135 mmol/L (ref 135–145)

## 2018-01-01 LAB — CBC
HEMATOCRIT: 37.5 % — AB (ref 39.0–52.0)
Hemoglobin: 11.4 g/dL — ABNORMAL LOW (ref 13.0–17.0)
MCH: 26.2 pg (ref 26.0–34.0)
MCHC: 30.4 g/dL (ref 30.0–36.0)
MCV: 86.2 fL (ref 80.0–100.0)
NRBC: 0 % (ref 0.0–0.2)
Platelets: 143 10*3/uL — ABNORMAL LOW (ref 150–400)
RBC: 4.35 MIL/uL (ref 4.22–5.81)
RDW: 15.1 % (ref 11.5–15.5)
WBC: 6.2 10*3/uL (ref 4.0–10.5)

## 2018-01-01 LAB — PROTIME-INR
INR: 1.78
Prothrombin Time: 20.5 seconds — ABNORMAL HIGH (ref 11.4–15.2)

## 2018-01-01 LAB — GLUCOSE, CAPILLARY
Glucose-Capillary: 100 mg/dL — ABNORMAL HIGH (ref 70–99)
Glucose-Capillary: 139 mg/dL — ABNORMAL HIGH (ref 70–99)

## 2018-01-01 MED ORDER — WARFARIN SODIUM 10 MG PO TABS: 10.0000 mg | ORAL_TABLET | Freq: Every evening | ORAL | Status: AC

## 2018-01-01 MED ORDER — WARFARIN SODIUM 2.5 MG PO TABS: 2.5000 mg | ORAL_TABLET | Freq: Every day | ORAL | Status: AC

## 2018-01-01 NOTE — Progress Notes (Signed)
Patient discharged home.  Reviewed AVS with patient including meds, verbalizes understanding.  Advised to quit smoking - handouts given.  No questions a this time.  Assisted off unit in NAD

## 2018-01-01 NOTE — Discharge Summary (Signed)
Physician Discharge Summary  Dale Franklin FUX:[id number] DOB: 1960-12-24 DOA: 12/30/2017  PCP: Karsten Ro, DO  Admit date: 12/30/2017 Discharge date: 01/01/2018  Admitted From: Home Disposition: Home   Recommendations for Outpatient Follow-up:  1. Follow up with PCP in 1-2 weeks, repeat INR, CBC. Recommended restart coumadin without bridging 5 days after last bleeding episode (11/15) for AFib with CHA2DS2-VASc of 2. First dose planned for 11/20.  2. Tobacco cessation counseling provided, recommend follow up with PCP with consideration of medication-assisted cessation. Patient ready to make attempt.  Home Health: None Equipment/Devices: None Discharge Condition: Stable CODE STATUS: Full Diet recommendation: Heart healthy, carb-modified  Brief/Interim Summary: Dale Chiltonis a 57 y.o.malewith a history ofprostate CA s/p radiation seeds on lupron, AFib on coumadin, T2DM, HTN, HLD, and rectal bleeding who resturned to the ED after passing a large amount of red blood per rectum containing clots. He was hemodynamically stable, hypertensive, with LLQ tenderness, +rectal blood on exam. Hgb 12.1, up from previous discharge on 10/6 which was 11.7g/dl. INR therapeutic. CT abdomen and pelvis demonstrated rectal wall thickening without other acute findings. Type and screen sent, cipro and flagyl started, GI consulted and patient underwent sigmoidoscopy 11/15 which showed actively bleeding telangiectasia which was treated with hemospray, not cautery. GI recommended vitamin K and monitoring CBC, INR for 48 hours. There has been no subsequent bleeding and the patient is tolerating a diet, hemodynamically stable for discharge.  Discharge Diagnoses:  Principal Problem:   Proctocolitis with rectal bleeding Active Problems:   Malignant neoplasm of prostate (HCC)   COPD (chronic obstructive pulmonary disease) (HCC)   Depression   Diabetes mellitus without complication (HCC)   Hypertension    Tobacco abuse   Obesity (BMI 30-39.9)   Atrial fibrillation, chronic   Rectal bleeding   Acute GI bleeding   Chronic left shoulder pain  Proctitis, bleeding rectal telangiectasia causing acute on chronic blood loss anemia: - Pt was started on empiric cipro/flagyl, though source of bleeding felt to be AVM, not proctitis, so these were stopped at discharge. - Hgb stable at 11.4 which is presumed baseline. No bleeding for >48 hours at discharge.  - Hold anticoagulation. Per GI, restart coumadin 11/20. Received vitamin K and INR from 2.7 > 1.78 on 1/17.   AFib: Stable, rate controlled. -CHA2DS2-VASc Scoreis2 due to HTN and DM. In the setting of recurrent GI bleeding, ?whether aspirin would be an acceptable alternative. - Holding coumadin as above. - Continue metoprolol  T2DM:  - Restart home medications.  HTN:  - Restart home medications  Prostate CAs/p TURP: ?radiation proctitis.  - Continue oxybutynin, no recent BOO,LUTS  Chronic left shoulder pain: Under care of pain management.  - Continue home medications  COPD: No flare - Continue prn albuterol  Hyperlipidemia:  - Continue statin  Insomnia:  - Continue trazodone  Tobacco use:  - Cessation counseling provided, recommended PCP follow up. Pt contemplative, ready for change. - Nicotine patch ordered  Obesity: BMI 39, counseling provided.   Discharge Instructions Discharge Instructions    Diet - low sodium heart healthy   Complete by:  As directed    Diet Carb Modified   Complete by:  As directed    Discharge instructions   Complete by:  As directed    You were admitted for GI bleeding due to vascular malformation in the rectum. This was treated at the time of sigmoidoscopy and there has been no further bleeding. You are stable for discharge with the follow recommendations:  -  Stop taking coumadin until 01/04/2018 and follow up with your doctor for repeat labs and to discuss smoking cessation.  - If  you notice significant GI bleeding, or shortness of breath/dizziness/chest pain, seek medical attention sooner   Increase activity slowly   Complete by:  As directed      Allergies as of 01/01/2018   No Known Allergies     Medication List    TAKE these medications   albuterol 108 (90 Base) MCG/ACT inhaler Commonly known as:  PROVENTIL HFA;VENTOLIN HFA Inhale 1-2 puffs into the lungs every 6 (six) hours as needed for wheezing or shortness of breath.   docusate sodium 100 MG capsule Commonly known as:  COLACE Take 2 capsules (200 mg total) by mouth daily.   DULoxetine 30 MG capsule Commonly known as:  CYMBALTA Take 90 mg by mouth daily.   glipiZIDE 10 MG tablet Commonly known as:  GLUCOTROL Take 10 mg by mouth 2 (two) times daily.   leuprolide 11.25 MG injection Commonly known as:  LUPRON Inject 11.25 mg into the muscle every 4 (four) months.   lisinopril-hydrochlorothiazide 10-12.5 MG tablet Commonly known as:  PRINZIDE,ZESTORETIC Take 1 tablet by mouth daily.   metFORMIN 500 MG 24 hr tablet Commonly known as:  GLUCOPHAGE-XR Take 500 mg by mouth 2 (two) times daily.   metoprolol succinate 25 MG 24 hr tablet Commonly known as:  TOPROL-XL Take 25 mg by mouth 2 (two) times daily.   morphine 30 MG tablet Commonly known as:  MSIR Take 30 mg by mouth every 8 (eight) hours as needed for pain.   oxybutynin 5 MG tablet Commonly known as:  DITROPAN Take 5 mg by mouth 3 (three) times daily.   psyllium 58.6 % powder Commonly known as:  METAMUCIL Take 2 packets by mouth daily.   simvastatin 10 MG tablet Commonly known as:  ZOCOR Take 10 mg by mouth at bedtime.   traZODone 50 MG tablet Commonly known as:  DESYREL Take 50 mg by mouth at bedtime.   warfarin 10 MG tablet Commonly known as:  COUMADIN Take 1 tablet (10 mg total) by mouth every evening. Hold until 01/04/2018 What changed:  additional instructions   warfarin 2.5 MG tablet Commonly known as:   COUMADIN Take 1 tablet (2.5 mg total) by mouth at bedtime. Hold until 01/04/2018 What changed:  additional instructions      Follow-up Information    Karsten Ro, DO. Schedule an appointment as soon as possible for a visit in 1 week(s).   Specialty:  Family Medicine Contact information: [address] [place] [address] [phone]        Rogene Houston, MD Follow up.   Specialty:  Gastroenterology Contact information: [place], [address] [address] [phone]          No Known Allergies  Consultations:  GI  Procedures/Studies: Ct Abdomen Pelvis W Contrast  Result Date: 12/30/2017 CLINICAL DATA:  Intermittent rectal bleeding. History of prostate carcinoma. EXAM: CT ABDOMEN AND PELVIS WITH CONTRAST TECHNIQUE: Multidetector CT imaging of the abdomen and pelvis was performed using the standard protocol following bolus administration of intravenous contrast. CONTRAST:  197mL ISOVUE-300 IOPAMIDOL (ISOVUE-300) INJECTION 61% COMPARISON:  None. FINDINGS: Lower chest: There is scarring in the left lung base region. No lung base edema or consolidation. Hepatobiliary: No focal liver lesions are appreciable. There are small gallstones in the gallbladder. The gallbladder wall does not appear appreciably thickened. There is no biliary duct  dilatation. Pancreas: There is no evident pancreatic mass or inflammatory focus. Spleen: There is a cyst arising from the anterior spleen measuring 1.1 x 1.0 cm. No other splenic lesions are evident. Adrenals/Urinary Tract: There is a stable nodular opacity in the anterior right adrenal measuring 1.0 x 0.7 cm. Adrenals bilaterally otherwise appear normal. Kidneys bilaterally show no evident mass or hydronephrosis on either side. There is no renal or ureteral calculus on either side. Urinary bladder is midline with wall thickness within normal limits. Stomach/Bowel: There is mild rectal wall thickening. No fistula or  perirectal stranding evident. No bowel wall thickening is noted elsewhere. Note that there is a midline lower abdominal wall hernia which contains loops of small and large bowel without bowel compromise evident. There is no demonstrable bowel obstruction. There is no free air or portal venous air. Vascular/Lymphatic: There is aortic and iliac artery atherosclerosis. No aneurysm evident. Major mesenteric arterial vessels are patent. There is no appreciable adenopathy in the abdomen or pelvis. Reproductive: Seed implants are noted in the prostate. The prostate and seminal vesicles are normal in size and contour. No pelvic mass is demonstrated on this study. Other: Appendix appears normal. No abscess or ascites is evident in the abdomen or pelvis. There is a ventral hernia in the lower abdomen which contains bowel. The neck of the hernia from right to left dimension measures 7.7 cm. Superior to inferior dimension of the hernia measures 4.5 cm. Musculoskeletal: There are no blastic or lytic bone lesions. There is no intramuscular lesion. IMPRESSION: 1. Wall thickening in the rectum, felt to represent a degree of proctitis. No perirectal stranding or abnormal fluid. Question proctitis as cause for rectal bleeding. Given rectal bleeding, direct visualization of the colon is felt to be warranted after appropriate colonic cleansing. 2. Lower abdominal ventral hernia which contains loops of small and large bowel without bowel compromise. 3.  Cholelithiasis. 4. No bowel obstruction. No abscess in the abdomen or pelvis. Appendix appears normal. 5.  Status post seed implant therapy for prostate carcinoma. 6.  No renal or ureteral calculus.  No hydronephrosis. 7.  Stable small nodular opacity in the right adrenal. Electronically Signed   By: Lowella Grip III M.D.   On: 12/30/2017 10:16     Flexible sigmoidoscopy 12/30/2017, Dr. Laural Golden:  Findings: The perianal and digital rectal examinations were normal. The  sigmoid colon appeared normal. Red blood was found in the rectum. Active bleeding from telangiectasia, To stop active bleeding, hemostatic  spray was deployed. A single spray was applied. There was no bleeding at  the end of the procedure. Impression: - The sigmoid colon is normal. - Active bleeding from rectal telangiectasia  controlled with Hemospray. - No specimens collected. Recommendation: Return patient to hospital ward for ongoing care. - Full liquid diet today. - Continue present medications. - Vit K 2 mg SQx 1 - CBC and INR in am.  Subjective: Feels well, wants to go home. Denies any bleeding or melena. Tolerating diet without abdominal pain, N/V. No lightheadedness, dyspnea, palpitations, or chest pain.   Discharge Exam: Vitals:   12/31/17 2040 01/01/18 0603  BP:  119/67  Pulse:  68  Resp:  18  Temp:  97.7 F (36.5 C)  SpO2: 99% 97%   General: Pt is alert, awake, not in acute distress Cardiovascular: RRR, S1/S2 +, no rubs, no gallops Respiratory: CTA bilaterally, no wheezing, no rhonchi Abdominal: Soft, obese, not tender or distended, +BS Extremities: No edema, no cyanosis  Labs: Basic Metabolic  Panel: Recent Labs  Lab 12/30/17 0813 01/01/18 0629  NA 136 135  K 4.0 3.8  CL 105 103  CO2 23 27  GLUCOSE 121* 127*  BUN 18 17  CREATININE 0.79 0.93  CALCIUM 8.7* 8.4*   Liver Function Tests: Recent Labs  Lab 12/30/17 0813  AST 17  ALT 14  ALKPHOS 88  BILITOT 0.7  PROT 6.6  ALBUMIN 3.5   CBC: Recent Labs  Lab 12/30/17 0813 12/30/17 1233 12/30/17 1954 12/31/17 0026 12/31/17 0627 01/01/18 0629  WBC 9.6  --   --   --  6.3 6.2  NEUTROABS 6.1  --   --   --   --   --   HGB 12.1* 11.7* 11.1* 11.0* 11.0* 11.4*  HCT 38.7*  --   --   --  36.0* 37.5*   MCV 84.5  --   --   --  87.6 86.2  PLT 171  --   --   --  143* 143*   CBG: Recent Labs  Lab 12/31/17 1604 12/31/17 2009 12/31/17 2337 01/01/18 0356 01/01/18 0756  GLUCAP 130* 148* 146* 100* 139*   Time coordinating discharge: Approximately 40 minutes  Patrecia Pour, MD  [facility] 01/01/2018, 10:39 AM Pager [phone]

## 2018-01-01 NOTE — Discharge Instructions (Signed)
Steps to Quit Smoking Smoking tobacco can be harmful to your health and can affect almost every organ in your body. Smoking puts you, and those around you, at risk for developing many serious chronic diseases. Quitting smoking is difficult, but it is one of the best things that you can do for your health. It is never too late to quit. What are the benefits of quitting smoking? When you quit smoking, you lower your risk of developing serious diseases and conditions, such as:  Lung cancer or lung disease, such as COPD.  Heart disease.  Stroke.  Heart attack.  Infertility.  Osteoporosis and bone fractures.  Additionally, symptoms such as coughing, wheezing, and shortness of breath may get better when you quit. You may also find that you get sick less often because your body is stronger at fighting off colds and infections. If you are pregnant, quitting smoking can help to reduce your chances of having a baby of low birth weight. How do I get ready to quit? When you decide to quit smoking, create a plan to make sure that you are successful. Before you quit:  Pick a date to quit. Set a date within the next two weeks to give you time to prepare.  Write down the reasons why you are quitting. Keep this list in places where you will see it often, such as on your bathroom mirror or in your car or wallet.  Identify the people, places, things, and activities that make you want to smoke (triggers) and avoid them. Make sure to take these actions: ? Throw away all cigarettes at home, at work, and in your car. ? Throw away smoking accessories, such as ashtrays and lighters. ? Clean your car and make sure to empty the ashtray. ? Clean your home, including curtains and carpets.  Tell your family, friends, and coworkers that you are quitting. Support from your loved ones can make quitting easier.  Talk with your health care provider about your options for quitting smoking.  Find out what treatment  options are covered by your health insurance.  What strategies can I use to quit smoking? Talk with your healthcare provider about different strategies to quit smoking. Some strategies include:  Quitting smoking altogether instead of gradually lessening how much you smoke over a period of time. Research shows that quitting "cold turkey" is more successful than gradually quitting.  Attending in-person counseling to help you build problem-solving skills. You are more likely to have success in quitting if you attend several counseling sessions. Even short sessions of 10 minutes can be effective.  Finding resources and support systems that can help you to quit smoking and remain smoke-free after you quit. These resources are most helpful when you use them often. They can include: ? Online chats with a counselor. ? Telephone quitlines. ? Printed self-help materials. ? Support groups or group counseling. ? Text messaging programs. ? Mobile phone applications.  Taking medicines to help you quit smoking. (If you are pregnant or breastfeeding, talk with your health care provider first.) Some medicines contain nicotine and some do not. Both types of medicines help with cravings, but the medicines that include nicotine help to relieve withdrawal symptoms. Your health care provider may recommend: ? Nicotine patches, gum, or lozenges. ? Nicotine inhalers or sprays. ? Non-nicotine medicine that is taken by mouth.  Talk with your health care provider about combining strategies, such as taking medicines while you are also receiving in-person counseling. Using these two strategies together   makes you more likely to succeed in quitting than if you used either strategy on its own. If you are pregnant or breastfeeding, talk with your health care provider about finding counseling or other support strategies to quit smoking. Do not take medicine to help you quit smoking unless told to do so by your health care  provider. What things can I do to make it easier to quit? Quitting smoking might feel overwhelming at first, but there is a lot that you can do to make it easier. Take these important actions:  Reach out to your family and friends and ask that they support and encourage you during this time. Call telephone quitlines, reach out to support groups, or work with a counselor for support.  Ask people who smoke to avoid smoking around you.  Avoid places that trigger you to smoke, such as bars, parties, or smoke-break areas at work.  Spend time around people who do not smoke.  Lessen stress in your life, because stress can be a smoking trigger for some people. To lessen stress, try: ? Exercising regularly. ? Deep-breathing exercises. ? Yoga. ? Meditating. ? Performing a body scan. This involves closing your eyes, scanning your body from head to toe, and noticing which parts of your body are particularly tense. Purposefully relax the muscles in those areas.  Download or purchase mobile phone or tablet apps (applications) that can help you stick to your quit plan by providing reminders, tips, and encouragement. There are many free apps, such as QuitGuide from the CDC (Centers for Disease Control and Prevention). You can find other support for quitting smoking (smoking cessation) through smokefree.gov and other websites.  How will I feel when I quit smoking? Within the first 24 hours of quitting smoking, you may start to feel some withdrawal symptoms. These symptoms are usually most noticeable 2-3 days after quitting, but they usually do not last beyond 2-3 weeks. Changes or symptoms that you might experience include:  Mood swings.  Restlessness, anxiety, or irritation.  Difficulty concentrating.  Dizziness.  Strong cravings for sugary foods in addition to nicotine.  Mild weight gain.  Constipation.  Nausea.  Coughing or a sore throat.  Changes in how your medicines work in your  body.  A depressed mood.  Difficulty sleeping (insomnia).  After the first 2-3 weeks of quitting, you may start to notice more positive results, such as:  Improved sense of smell and taste.  Decreased coughing and sore throat.  Slower heart rate.  Lower blood pressure.  Clearer skin.  The ability to breathe more easily.  Fewer sick days.  Quitting smoking is very challenging for most people. Do not get discouraged if you are not successful the first time. Some people need to make many attempts to quit before they achieve long-term success. Do your best to stick to your quit plan, and talk with your health care provider if you have any questions or concerns. This information is not intended to replace advice given to you by your health care provider. Make sure you discuss any questions you have with your health care provider. Document Released: 01/26/2001 Document Revised: 09/30/2015 Document Reviewed: 06/18/2014 Elsevier Interactive Patient Education  2018 Elsevier Inc.  

## 2018-01-03 ENCOUNTER — Encounter (HOSPITAL_COMMUNITY): Payer: Self-pay | Admitting: Internal Medicine

## 2018-01-03 NOTE — Telephone Encounter (Signed)
Patient was treated in the hospital last week.

## 2018-01-19 DIAGNOSIS — J209 Acute bronchitis, unspecified: Secondary | ICD-10-CM | POA: Diagnosis not present

## 2018-01-19 DIAGNOSIS — J019 Acute sinusitis, unspecified: Secondary | ICD-10-CM | POA: Diagnosis not present

## 2018-01-19 DIAGNOSIS — M542 Cervicalgia: Secondary | ICD-10-CM | POA: Diagnosis not present

## 2018-01-20 ENCOUNTER — Other Ambulatory Visit: Payer: Self-pay

## 2018-01-20 NOTE — Patient Outreach (Signed)
[place] [facility]) Care Management  01/20/2018  Jocsan Mcginley [birth year] [id number]   Medication Adherence call to [name] patient's telephone number is an invalid number and it belongs to a company, patient is due on Glipizide 10 mg ,Simvastatin 10 mg, Metformin ER 500 mg and Lisinopril/HCTZ 10/12.5 mg. Mr. Reep is showing past due under [place].   [place] Management Direct Dial [phone]  Fax [phone] [email or web address]

## 2018-01-30 ENCOUNTER — Other Ambulatory Visit: Payer: Self-pay

## 2018-01-30 NOTE — Patient Outreach (Signed)
[place] [facility]) Care Management  01/30/2018  Heather Streeper 1960-06-25 [id number]   Medication Adherence call to [name] patient's telephone number is disconnected patient is due on Lisinopril /HCTZ 10/12.5 mg. Mr. Rosengren is showing past due under [place].   [place] Management Direct Dial [phone]  Fax [phone] [email or web address]

## 2018-02-01 ENCOUNTER — Other Ambulatory Visit: Payer: Self-pay

## 2018-02-01 NOTE — Patient Outreach (Signed)
[place] [facility]) Care Management  02/01/2018  Dale Franklin 1960-05-25 [id number]   Medication Adherence call to [name]  patient's telephone number is disconnected patient is due on Metformin Er 500 mg and Glipizide 10 mg. [facility] said patient's medication was pick up on Dec.13. for a 90 days supply. [name] is showing past due under [place].   [place] Management Direct Dial [phone]  Fax [phone] [email or web address]

## 2018-02-14 ENCOUNTER — Other Ambulatory Visit: Payer: Self-pay

## 2018-02-14 ENCOUNTER — Observation Stay (HOSPITAL_COMMUNITY)
Admission: EM | Admit: 2018-02-14 | Discharge: 2018-02-15 | Disposition: A | Discharge status: Home / Self Care | Payer: Medicare Other | Attending: Internal Medicine | Admitting: Internal Medicine

## 2018-02-14 ENCOUNTER — Encounter (HOSPITAL_COMMUNITY): Payer: Self-pay | Admitting: *Deleted

## 2018-02-14 ENCOUNTER — Emergency Department (HOSPITAL_COMMUNITY): Payer: Medicare Other

## 2018-02-14 DIAGNOSIS — K8020 Calculus of gallbladder without cholecystitis without obstruction: Secondary | ICD-10-CM | POA: Diagnosis not present

## 2018-02-14 DIAGNOSIS — D649 Anemia, unspecified: Secondary | ICD-10-CM | POA: Diagnosis present

## 2018-02-14 DIAGNOSIS — I482 Chronic atrial fibrillation, unspecified: Secondary | ICD-10-CM | POA: Insufficient documentation

## 2018-02-14 DIAGNOSIS — E119 Type 2 diabetes mellitus without complications: Secondary | ICD-10-CM | POA: Diagnosis not present

## 2018-02-14 DIAGNOSIS — K921 Melena: Secondary | ICD-10-CM | POA: Diagnosis not present

## 2018-02-14 DIAGNOSIS — I10 Essential (primary) hypertension: Secondary | ICD-10-CM | POA: Diagnosis not present

## 2018-02-14 DIAGNOSIS — R109 Unspecified abdominal pain: Secondary | ICD-10-CM | POA: Diagnosis present

## 2018-02-14 DIAGNOSIS — F17210 Nicotine dependence, cigarettes, uncomplicated: Secondary | ICD-10-CM | POA: Diagnosis not present

## 2018-02-14 DIAGNOSIS — Z720 Tobacco use: Secondary | ICD-10-CM | POA: Diagnosis not present

## 2018-02-14 DIAGNOSIS — Z79899 Other long term (current) drug therapy: Secondary | ICD-10-CM | POA: Diagnosis not present

## 2018-02-14 DIAGNOSIS — Z7984 Long term (current) use of oral hypoglycemic drugs: Secondary | ICD-10-CM | POA: Diagnosis not present

## 2018-02-14 DIAGNOSIS — F32A Depression, unspecified: Secondary | ICD-10-CM | POA: Diagnosis present

## 2018-02-14 DIAGNOSIS — K439 Ventral hernia without obstruction or gangrene: Secondary | ICD-10-CM | POA: Diagnosis not present

## 2018-02-14 DIAGNOSIS — K5900 Constipation, unspecified: Secondary | ICD-10-CM | POA: Diagnosis not present

## 2018-02-14 DIAGNOSIS — K450 Other specified abdominal hernia with obstruction, without gangrene: Secondary | ICD-10-CM | POA: Diagnosis present

## 2018-02-14 DIAGNOSIS — R1084 Generalized abdominal pain: Secondary | ICD-10-CM | POA: Diagnosis not present

## 2018-02-14 DIAGNOSIS — J449 Chronic obstructive pulmonary disease, unspecified: Secondary | ICD-10-CM | POA: Diagnosis not present

## 2018-02-14 DIAGNOSIS — F329 Major depressive disorder, single episode, unspecified: Secondary | ICD-10-CM | POA: Diagnosis present

## 2018-02-14 DIAGNOSIS — Z8546 Personal history of malignant neoplasm of prostate: Secondary | ICD-10-CM | POA: Diagnosis not present

## 2018-02-14 DIAGNOSIS — K432 Incisional hernia without obstruction or gangrene: Secondary | ICD-10-CM | POA: Diagnosis not present

## 2018-02-14 DIAGNOSIS — K458 Other specified abdominal hernia without obstruction or gangrene: Secondary | ICD-10-CM | POA: Diagnosis not present

## 2018-02-14 LAB — GLUCOSE, CAPILLARY
GLUCOSE-CAPILLARY: 80 mg/dL (ref 70–99)
Glucose-Capillary: 100 mg/dL — ABNORMAL HIGH (ref 70–99)
Glucose-Capillary: 63 mg/dL — ABNORMAL LOW (ref 70–99)
Glucose-Capillary: 66 mg/dL — ABNORMAL LOW (ref 70–99)
Glucose-Capillary: 69 mg/dL — ABNORMAL LOW (ref 70–99)
Glucose-Capillary: 93 mg/dL (ref 70–99)
Glucose-Capillary: 123 mg/dL — ABNORMAL HIGH (ref 70–99)

## 2018-02-14 LAB — CBC WITH DIFFERENTIAL/PLATELET
Abs Immature Granulocytes: 0.02 10*3/uL (ref 0.00–0.07)
Basophils Absolute: 0 10*3/uL (ref 0.0–0.1)
Basophils Relative: 0 %
Eosinophils Absolute: 0.2 10*3/uL (ref 0.0–0.5)
Eosinophils Relative: 3 %
HCT: 37.8 % — ABNORMAL LOW (ref 39.0–52.0)
Hemoglobin: 11.4 g/dL — ABNORMAL LOW (ref 13.0–17.0)
Immature Granulocytes: 0 %
Lymphocytes Relative: 28 %
Lymphs Abs: 2.4 10*3/uL (ref 0.7–4.0)
MCH: 25.2 pg — ABNORMAL LOW (ref 26.0–34.0)
MCHC: 30.2 g/dL (ref 30.0–36.0)
MCV: 83.4 fL (ref 80.0–100.0)
Monocytes Absolute: 0.7 10*3/uL (ref 0.1–1.0)
Monocytes Relative: 8 %
NEUTROS ABS: 5.3 10*3/uL (ref 1.7–7.7)
Neutrophils Relative %: 61 %
PLATELETS: 153 10*3/uL (ref 150–400)
RBC: 4.53 MIL/uL (ref 4.22–5.81)
RDW: 15.5 % (ref 11.5–15.5)
WBC: 8.7 10*3/uL (ref 4.0–10.5)
nRBC: 0 % (ref 0.0–0.2)

## 2018-02-14 LAB — PROTIME-INR
INR: 4.01
Prothrombin Time: 38.4 seconds — ABNORMAL HIGH (ref 11.4–15.2)

## 2018-02-14 LAB — URINALYSIS, ROUTINE W REFLEX MICROSCOPIC
Bilirubin Urine: NEGATIVE
Glucose, UA: NEGATIVE mg/dL
HGB URINE DIPSTICK: NEGATIVE
Ketones, ur: NEGATIVE mg/dL
Leukocytes, UA: NEGATIVE
Nitrite: NEGATIVE
Protein, ur: NEGATIVE mg/dL
Specific Gravity, Urine: 1.015 (ref 1.005–1.030)
pH: 5 (ref 5.0–8.0)

## 2018-02-14 LAB — COMPREHENSIVE METABOLIC PANEL
ALT: 26 U/L (ref 0–44)
AST: 23 U/L (ref 15–41)
Albumin: 3.6 g/dL (ref 3.5–5.0)
Alkaline Phosphatase: 118 U/L (ref 38–126)
Anion gap: 7 (ref 5–15)
BUN: 30 mg/dL — ABNORMAL HIGH (ref 6–20)
CO2: 29 mmol/L (ref 22–32)
Calcium: 8.3 mg/dL — ABNORMAL LOW (ref 8.9–10.3)
Chloride: 97 mmol/L — ABNORMAL LOW (ref 98–111)
Creatinine, Ser: 0.92 mg/dL (ref 0.61–1.24)
GFR calc Af Amer: >60 mL/min (ref >60)
GFR calc non Af Amer: >60 mL/min (ref >60)
Glucose, Bld: 153 mg/dL — ABNORMAL HIGH (ref 70–99)
Potassium: 4.2 mmol/L (ref 3.5–5.1)
Sodium: 133 mmol/L — ABNORMAL LOW (ref 135–145)
Total Bilirubin: 0.4 mg/dL (ref 0.3–1.2)
Total Protein: 6.2 g/dL — ABNORMAL LOW (ref 6.5–8.1)

## 2018-02-14 LAB — HEMOGLOBIN A1C
Hgb A1c MFr Bld: 6 % — ABNORMAL HIGH (ref 4.8–5.6)
Mean Plasma Glucose: 125.5 mg/dL

## 2018-02-14 LAB — LACTIC ACID, PLASMA: LACTIC ACID, VENOUS: 1.1 mmol/L (ref 0.5–1.9)

## 2018-02-14 LAB — LIPASE, BLOOD: Lipase: 29 U/L (ref 11–51)

## 2018-02-14 MED ORDER — ONDANSETRON HCL 4 MG/2ML IJ SOLN
4.0000 mg | Freq: Once | INTRAMUSCULAR | Status: AC
  Administered 2018-02-14: 4 mg via INTRAVENOUS
  Filled 2018-02-14: qty 2

## 2018-02-14 MED ORDER — ONDANSETRON HCL 4 MG PO TABS: 4.0000 mg | ORAL_TABLET | Freq: Four times a day (QID) | ORAL | Status: DC | PRN

## 2018-02-14 MED ORDER — NICOTINE 21 MG/24HR TD PT24: 21.0000 mg | MEDICATED_PATCH | Freq: Every day | TRANSDERMAL | Status: DC | PRN

## 2018-02-14 MED ORDER — SODIUM CHLORIDE 0.9 % IV BOLUS
500.0000 mL | Freq: Once | INTRAVENOUS | Status: AC
  Administered 2018-02-14: 500 mL via INTRAVENOUS

## 2018-02-14 MED ORDER — MORPHINE SULFATE (PF) 4 MG/ML IV SOLN
4.0000 mg | INTRAVENOUS | Status: DC | PRN
  Administered 2018-02-14 – 2018-02-15 (×3): 4 mg via INTRAVENOUS
  Filled 2018-02-14 (×3): qty 1

## 2018-02-14 MED ORDER — POLYETHYLENE GLYCOL 3350 17 G PO PACK
17.0000 g | PACK | Freq: Every day | ORAL | Status: DC
  Administered 2018-02-14: 17 g via ORAL
  Filled 2018-02-14 (×2): qty 1

## 2018-02-14 MED ORDER — ONDANSETRON HCL 4 MG/2ML IJ SOLN: 4.0000 mg | Freq: Four times a day (QID) | INTRAMUSCULAR | Status: DC | PRN

## 2018-02-14 MED ORDER — ACETAMINOPHEN 650 MG RE SUPP: 650.0000 mg | Freq: Four times a day (QID) | RECTAL | Status: DC | PRN

## 2018-02-14 MED ORDER — ACETAMINOPHEN 325 MG PO TABS: 650.0000 mg | ORAL_TABLET | Freq: Four times a day (QID) | ORAL | Status: DC | PRN

## 2018-02-14 MED ORDER — INSULIN ASPART 100 UNIT/ML ~~LOC~~ SOLN
0.0000 [IU] | SUBCUTANEOUS | Status: DC
  Administered 2018-02-15: 2 [IU] via SUBCUTANEOUS

## 2018-02-14 MED ORDER — HYDROMORPHONE HCL 1 MG/ML IJ SOLN: 1.0000 mg | INTRAMUSCULAR | Status: DC | PRN

## 2018-02-14 MED ORDER — HYDROMORPHONE HCL 1 MG/ML IJ SOLN
1.0000 mg | Freq: Once | INTRAMUSCULAR | Status: AC
  Administered 2018-02-14: 1 mg via INTRAVENOUS
  Filled 2018-02-14: qty 1

## 2018-02-14 MED ORDER — IOPAMIDOL (ISOVUE-300) INJECTION 61%
100.0000 mL | Freq: Once | INTRAVENOUS | Status: AC | PRN
  Administered 2018-02-14: 100 mL via INTRAVENOUS

## 2018-02-14 MED ORDER — METOPROLOL TARTRATE 5 MG/5ML IV SOLN
2.5000 mg | Freq: Four times a day (QID) | INTRAVENOUS | Status: DC
  Administered 2018-02-14 – 2018-02-15 (×4): 2.5 mg via INTRAVENOUS
  Filled 2018-02-14 (×4): qty 5

## 2018-02-14 MED ORDER — LORAZEPAM 1 MG PO TABS
1.0000 mg | ORAL_TABLET | Freq: Once | ORAL | Status: AC
  Administered 2018-02-14: 1 mg via ORAL
  Filled 2018-02-14: qty 1

## 2018-02-14 MED ORDER — FAMOTIDINE IN NACL 20-0.9 MG/50ML-% IV SOLN
20.0000 mg | Freq: Two times a day (BID) | INTRAVENOUS | Status: DC
  Administered 2018-02-14: 20 mg via INTRAVENOUS
  Filled 2018-02-14 (×2): qty 50

## 2018-02-14 MED ORDER — PROCHLORPERAZINE EDISYLATE 10 MG/2ML IJ SOLN
10.0000 mg | Freq: Once | INTRAMUSCULAR | Status: AC
  Administered 2018-02-14: 10 mg via INTRAVENOUS
  Filled 2018-02-14: qty 2

## 2018-02-14 NOTE — H&P (Signed)
History and Physical    Dale Franklin UDJ:[id number] DOB: Nov 17, 1960 DOA: 02/14/2018  PCP: Dale Ro, DO   Patient coming from: HOME.  I have personally briefly reviewed patient's old medical records in [place]  Chief Complaint: Abdominal pain.  HPI: Dale Franklin is a 57 y.o. male with medical history significant of osteoarthritis, bowel obstruction episodes, COPD, depression, type 2 diabetes, history of atrial flutter/atrial fibrillation, hypertension, prostate cancer who is coming to the emergency department with complaints of RUQ abdominal pain associated with nausea, constipation for the last 4 days.  He denies emesis, diarrhea, melena or hematochezia.  He denies dysuria, frequency or hematuria.  No nonradiating fever, chills, sore throat, hemoptysis, chest pain, palpitations, dizziness, diaphoresis, PND, orthopnea or recent pitting edema of the lower extremities.  He denies polyuria, polydipsia, polyphagia or blurred vision.  No heat or cold intolerance.  Denies skin rashes or pruritus.  ED Course: Initial vital signs temperature 97.7 F, pulse 101, respirations 20, blood pressure 135/78 mmHg and O2 sat 96% on room air.  The patient received a 500 mL normal saline bolus, Zofran 4 mg IVP x1 and hydromorphone 1 mg IVP x1.  Urinalysis was normal.  His CBC showed a white count was 8.7 with a normal differential, hemoglobin 11.4 g/dL and platelets 153.  BNP was 38.4 seconds and INR was 4.01 Lactic acid was normal.  CMP shows a sodium 133, chloride of 4.2, chloride 97 CO2 29 mmol/L.  BUN was 30, creatinine 0.92, calcium 8.3 and glucose 153 mg/dL.  Total protein was 6.2 g/dL, but the rest of the hepatic function panel was within normal limits.  Lipase was normal.  Imaging: CT abdomen/pelvis with contrast shows herniation of 2 segments of small bowel into the right lower quadrant anterior abdominal hernia, with incarceration and associated free fluid.  There is underlying mesenteric  edema noted, this raises concern for some degree of strangulation.  There is no evidence of bowel obstruction.  There is cholelithiasis.  Please see images and full radiology report for further detail.  Review of Systems: As per HPI otherwise 10 point review of systems negative.   Past Medical History:  Diagnosis Date  . Arthritis   . Bowel obstruction ([place])   . COPD (chronic obstructive pulmonary disease) ([place])   . Depression   . Diabetes mellitus without complication ([place])   . Dysrhythmia    A flutter  . Hypertension   . Prostate cancer Dale Franklin)     Past Surgical History:  Procedure Laterality Date  . COLONOSCOPY WITH PROPOFOL N/A 11/04/2017   Procedure: COLONOSCOPY WITH PROPOFOL;  Surgeon: Dale Houston, MD;  Location[facility];  Service: Endoscopy;  Laterality: N/A;  2:25  . FLEXIBLE SIGMOIDOSCOPY N/A 12/30/2017   Procedure: FLEXIBLE SIGMOIDOSCOPY;  Surgeon: Dale Houston, MD;  Location: [facility];  Service: Endoscopy;  Laterality: N/A;  . GOLD SEED IMPLANT N/A 05/31/2016   Procedure: GOLD SEED IMPLANT;  Surgeon: Dale Gallo, MD;  Location: [facility];  Service: Urology;  Laterality: N/A;  . HAND SURGERY Right   . HERNIA REPAIR    . POLYPECTOMY  11/04/2017   Procedure: POLYPECTOMY;  Surgeon: Dale Houston, MD;  Location: [facility];  Service: Endoscopy;;  cecal polyp (CSx1), transverse colon (CSx2),recto-sigmoid(CSx1)  . TRANSURETHRAL RESECTION OF PROSTATE N/A 05/31/2016   Procedure: TRANSURETHRAL RESECTION OF THE PROSTATE (TURP);  Surgeon: Dale Gallo, MD;  Location: [facility];  Service: Urology;  Laterality: N/A;  . WRIST SURGERY Right  reports that he has been smoking cigarettes. He has been smoking about 0.50 packs per day. He has never used smokeless tobacco. He reports previous alcohol use. He reports that he does not use drugs.  No Known Allergies  Family History  Problem Relation Age of Onset  . Diabetes Mother   . Cancer Father   . Cancer  Paternal Uncle        prostate   Prior to Admission medications   Medication Sig Start Date End Date Taking? Authorizing Provider  albuterol (PROVENTIL HFA;VENTOLIN HFA) 108 (90 Base) MCG/ACT inhaler Inhale 1-2 puffs into the lungs every 6 (six) hours as needed for wheezing or shortness of breath.    [provider]  docusate sodium (COLACE) 100 MG capsule Take 2 capsules (200 mg total) by mouth daily. 11/20/17 11/20/18  Dale Dubois, MD  DULoxetine (CYMBALTA) 30 MG capsule Take 90 mg by mouth daily.     [provider]  glipiZIDE (GLUCOTROL) 10 MG tablet Take 10 mg by mouth 2 (two) times daily.    [provider]  leuprolide (LUPRON) 11.25 MG injection Inject 11.25 mg into the muscle every 4 (four) months.     [provider]  lisinopril-hydrochlorothiazide (PRINZIDE,ZESTORETIC) 10-12.5 MG tablet Take 1 tablet by mouth daily.     [provider]  metFORMIN (GLUCOPHAGE-XR) 500 MG 24 hr tablet Take 500 mg by mouth 2 (two) times daily.     [provider]  metoprolol succinate (TOPROL-XL) 25 MG 24 hr tablet Take 25 mg by mouth 2 (two) times daily.     [provider]  morphine (MSIR) 30 MG tablet Take 30 mg by mouth every 8 (eight) hours as needed for pain. 10/13/17   [provider]  oxybutynin (DITROPAN) 5 MG tablet Take 5 mg by mouth 3 (three) times daily.     [provider]  psyllium (METAMUCIL MULTIHEALTH FIBER) 58.6 % powder Take 2 packets by mouth daily. 11/20/17   Dale Dubois, MD  simvastatin (ZOCOR) 10 MG tablet Take 10 mg by mouth at bedtime. 06/30/17   [provider]  traZODone (DESYREL) 50 MG tablet Take 50 mg by mouth at bedtime.    [provider]  warfarin (COUMADIN) 10 MG tablet Take 1 tablet (10 mg total) by mouth every evening. Hold until 01/04/2018 01/01/18   Patrecia Pour, MD  warfarin (COUMADIN) 2.5 MG tablet Take 1 tablet (2.5 mg total) by mouth at bedtime. Hold until 01/04/2018  01/01/18   Patrecia Pour, MD    Physical Exam: Vitals:   02/14/18 0307 02/14/18 0308  BP:  135/78  Pulse:  (!) 101  Resp:  20  Temp:  97.7 F (36.5 C)  TempSrc:  Oral  SpO2:  96%  Weight: (!) 138.3 kg   Height: 6\' 3"  (1.905 m)     Constitutional: NAD, calm, comfortable Eyes: PERRL, lids and conjunctivae normal ENMT: Mucous membranes are moist. Posterior pharynx clear of any exudate or lesions. Neck: normal, supple, no masses, no thyromegaly Respiratory: Mild wheezing bilaterally, no wheezing, no crackles. Normal respiratory effort. No accessory muscle use.  Cardiovascular: Occasional ES, no murmurs / rubs / gallops. No extremity edema. 2+ pedal pulses. No carotid bruits.  Abdomen: Obese, midline surgical scar, right-sided ventral hernia, and RUQ tenderness, no guarding or rebound, no masses palpated. No hepatosplenomegaly. Bowel sounds positive.  Musculoskeletal: no clubbing / cyanosis. Good ROM, no contractures. Normal muscle tone.  Skin: no rashes, lesions, ulcers on limited dermatological  examination. Neurologic: CN 2-12 grossly intact. Sensation intact, DTR normal. Strength 5/5 in all 4.  Psychiatric: Normal judgment and insight. Alert and oriented x 3. Normal mood.   Labs on Admission: I have personally reviewed following labs and imaging studies  CBC: Recent Labs  Lab 02/14/18 0346  WBC 8.7  NEUTROABS 5.3  HGB 11.4*  HCT 37.8*  MCV 83.4  PLT 412   Basic Metabolic Panel: Recent Labs  Lab 02/14/18 0346  NA 133*  K 4.2  CL 97*  CO2 29  GLUCOSE 153*  BUN 30*  CREATININE 0.92  CALCIUM 8.3*   GFR: Estimated Creatinine Clearance: 132.8 mL/min (by C-G formula based on SCr of 0.92 mg/dL). Liver Function Tests: Recent Labs  Lab 02/14/18 0346  AST 23  ALT 26  ALKPHOS 118  BILITOT 0.4  PROT 6.2*  ALBUMIN 3.6   Recent Labs  Lab 02/14/18 0346  LIPASE 29   No results for input(s): AMMONIA in the last 168 hours. Coagulation Profile: No results for  input(s): INR, PROTIME in the last 168 hours. Cardiac Enzymes: No results for input(s): CKTOTAL, CKMB, CKMBINDEX, TROPONINI in the last 168 hours. BNP (last 3 results) No results for input(s): PROBNP in the last 8760 hours. HbA1C: No results for input(s): HGBA1C in the last 72 hours. CBG: No results for input(s): GLUCAP in the last 168 hours. Lipid Profile: No results for input(s): CHOL, HDL, LDLCALC, TRIG, CHOLHDL, LDLDIRECT in the last 72 hours. Thyroid Function Tests: No results for input(s): TSH, T4TOTAL, FREET4, T3FREE, THYROIDAB in the last 72 hours. Anemia Panel: No results for input(s): VITAMINB12, FOLATE, FERRITIN, TIBC, IRON, RETICCTPCT in the last 72 hours. Urine analysis:    Component Value Date/Time   COLORURINE YELLOW 02/14/2018 0329   APPEARANCEUR CLEAR 02/14/2018 0329   LABSPEC 1.015 02/14/2018 0329   PHURINE 5.0 02/14/2018 0329   GLUCOSEU NEGATIVE 02/14/2018 0329   HGBUR NEGATIVE 02/14/2018 0329   BILIRUBINUR NEGATIVE 02/14/2018 0329   KETONESUR NEGATIVE 02/14/2018 0329   PROTEINUR NEGATIVE 02/14/2018 0329   NITRITE NEGATIVE 02/14/2018 0329   LEUKOCYTESUR NEGATIVE 02/14/2018 0329    Radiological Exams on Admission: Ct Abdomen Pelvis W Contrast  Result Date: 02/14/2018 CLINICAL DATA:  Acute onset of periumbilical and bilateral lower quadrant abdominal pain. No recent bowel movements. EXAM: CT ABDOMEN AND PELVIS WITH CONTRAST TECHNIQUE: Multidetector CT imaging of the abdomen and pelvis was performed using the standard protocol following bolus administration of intravenous contrast. CONTRAST:  191mL ISOVUE-300 IOPAMIDOL (ISOVUE-300) INJECTION 61% COMPARISON:  CT of the abdomen and pelvis performed 12/30/2017 FINDINGS: Lower chest: Minimal bibasilar atelectasis or scarring is noted. The visualized portions of the mediastinum are unremarkable. Hepatobiliary: The liver is unremarkable in appearance. Stones are suggested within the gallbladder. The gallbladder is  otherwise unremarkable. The common bile duct is normal in caliber. Pancreas: The pancreas is within normal limits. Spleen: A 1.2 cm cystic focus is noted at the anterior aspect of the spleen. Adrenals/Urinary Tract: The adrenal glands are unremarkable in appearance. Nonspecific perinephric stranding is noted bilaterally. There is no evidence of hydronephrosis. No renal or ureteral stones are identified. Stomach/Bowel: The stomach is unremarkable in appearance. There is herniation of 2 segments of small bowel into a right lower quadrant anterior abdominal hernia, with incarceration and associated free fluid. Underlying mesenteric edema is noted, raising concern for some degree of strangulation. There is no definite evidence for bowel obstruction at this time. The small bowel is otherwise unremarkable. The appendix is normal in caliber, without  evidence of appendicitis. The colon is unremarkable in appearance. Vascular/Lymphatic: Scattered calcification is seen along the abdominal aorta and its branches. The abdominal aorta is otherwise grossly unremarkable. The inferior vena cava is grossly unremarkable. No retroperitoneal lymphadenopathy is seen. No pelvic sidewall lymphadenopathy is identified. Reproductive: The bladder is mildly distended and grossly unremarkable. The patient is status post prostatectomy. Other: No additional soft tissue abnormalities are seen. Musculoskeletal: No acute osseous abnormalities are identified. There is slight chronic loss of height at vertebral body L1. The visualized musculature is unremarkable in appearance. IMPRESSION: 1. Herniation of 2 segments of small bowel into a right lower quadrant anterior abdominal hernia, with incarceration and associated free fluid. Underlying mesenteric edema noted, raising concern for some degree of strangulation. No definite evidence for bowel obstruction at this time. 2. Cholelithiasis. Gallbladder otherwise unremarkable. 3. 1.2 cm cystic focus at  the anterior aspect of the spleen, relatively stable from 2017 and likely benign. Aortic Atherosclerosis (ICD10-I70.0). These results were called by telephone at the time of interpretation on 02/14/2018 at 5:26 am to Dr. Joseph Berkshire, who verbally acknowledged these results. Electronically Signed   By: Garald Balding M.D.   On: 02/14/2018 05:28    EKG: Independently reviewed.  Vent. rate 108 BPM PR interval * ms QRS duration 152 ms QT/QTc 440/590 ms P-R-T axes * 0 40 Atrial flutter with predominant 2:1 AV block IVCD, consider atypical RBBB Baseline wander in lead(s) V2 No significant change since last tracing  Assessment/Plan Principal Problem:   Incarcerated hernia of abdominal cavity Observation/telemetry. Keep n.p.o. Analgesics as needed. Antiemetics as needed. Famotidine 20 mg IVP every 12 hours. General surgery to evaluate.  Active Problems:   COPD (chronic obstructive pulmonary disease) (HCC) Supplemental oxygen and bronchodilators as needed. Smoking cessation suggested.    Depression Continue Cymbalta 90 mg p.o. daily.    Hypertension Continue Prinzide 10 to 12.5 mg p.o. daily. Continue metoprolol XL 25 mg p.o. twice daily. Monitor BP, heart rate, renal function and electrolytes.    Tobacco abuse Nicotine replacement therapy ordered. Nurse to provide collateral cessation information.    Atrial fibrillation, chronic CHA2DS2-VASc score Of at least 3. Continue metoprolol XL 25 mg p.o. daily Continue warfarin.    Diabetes mellitus type II, non insulin dependent (HCC) Carbohydrate modified diet. Continue glipizide 10 mg p.o. twice daily. Continue metformin 500 mg p.o. twice daily. CBG monitoring before meals and bedtime.    Anemia Monitor hematocrit and hemoglobin.   DVT prophylaxis: On [place] Code Status: Full code. Family Communication: Disposition Plan: Observation for pain control and general surgery evaluation. Consults called: General surgery  Blake Divine, MD). Admission status: Observation/telemetry.   Reubin Milan MD [facility]   If 7PM-7AM, please contact night-coverage [email or web address] Password [email or web address]  02/14/2018, 7:02 AM

## 2018-02-14 NOTE — ED Provider Notes (Signed)
[facility] EMERGENCY DEPARTMENT Provider Note   CSN: [id number] Arrival date & time: 02/14/18  0256     History   Chief Complaint Chief Complaint  Patient presents with  . Abdominal Pain    HPI Dale Franklin is a 57 y.o. male.  Patient presents to the emergency department for evaluation of abdominal pain.  Patient reports that he has not had a bowel movement in 4 days.  He has had progressively increasing diffuse abdominal pain.  He took 1 of his friends GoLYTELY bowel preps without result.  Patient ports that he has been passing gas but decreased amounts.  He has not had nausea or vomiting.     Past Medical History:  Diagnosis Date  . Arthritis   . Bowel obstruction ([place])   . COPD (chronic obstructive pulmonary disease) ([place])   . Depression   . Diabetes mellitus without complication ([place])   . Dysrhythmia    A flutter  . Hypertension   . Prostate cancer [facility])     Patient Active Problem List   Diagnosis Date Noted  . Incarcerated hernia of abdominal cavity 02/14/2018  . Anemia 02/14/2018  . Proctocolitis with rectal bleeding 12/30/2017  . Chronic left shoulder pain 12/30/2017  . Acute GI bleeding 11/19/2017  . Diabetes mellitus type II, non insulin dependent ([place]) 11/19/2017  . Rectal bleeding 10/18/2017  . Obesity (BMI 30-39.9)   . Atrial fibrillation, chronic   . Tobacco abuse 07/21/2017  . COPD (chronic obstructive pulmonary disease) ([place])   . Depression   . Diabetes mellitus without complication ([place])   . Hypertension   . Bladder outlet obstruction 06/01/2016  . Malignant neoplasm of prostate ([place]) 05/31/2016  . Prostate cancer ([place]) 05/11/2016    Past Surgical History:  Procedure Laterality Date  . COLONOSCOPY WITH PROPOFOL N/A 11/04/2017   Procedure: COLONOSCOPY WITH PROPOFOL;  Surgeon: Rogene Houston, MD;  Location: [facility] ENDO SUITE;  Service: Endoscopy;  Laterality: N/A;  2:25  . FLEXIBLE SIGMOIDOSCOPY N/A 12/30/2017   Procedure: FLEXIBLE  SIGMOIDOSCOPY;  Surgeon: Rogene Houston, MD;  Location: [facility] ENDO SUITE;  Service: Endoscopy;  Laterality: N/A;  . GOLD SEED IMPLANT N/A 05/31/2016   Procedure: GOLD SEED IMPLANT;  Surgeon: Franchot Gallo, MD;  Location: [facility] ORS;  Service: Urology;  Laterality: N/A;  . HAND SURGERY Right   . HERNIA REPAIR    . POLYPECTOMY  11/04/2017   Procedure: POLYPECTOMY;  Surgeon: Rogene Houston, MD;  Location: [facility] ENDO SUITE;  Service: Endoscopy;;  cecal polyp (CSx1), transverse colon (CSx2),recto-sigmoid(CSx1)  . TRANSURETHRAL RESECTION OF PROSTATE N/A 05/31/2016   Procedure: TRANSURETHRAL RESECTION OF THE PROSTATE (TURP);  Surgeon: Franchot Gallo, MD;  Location: [facility] ORS;  Service: Urology;  Laterality: N/A;  . WRIST SURGERY Right         Home Medications    Prior to Admission medications   Medication Sig Start Date End Date Taking? Authorizing Provider  albuterol (PROVENTIL HFA;VENTOLIN HFA) 108 (90 Base) MCG/ACT inhaler Inhale 1-2 puffs into the lungs every 6 (six) hours as needed for wheezing or shortness of breath.    [provider]  docusate sodium (COLACE) 100 MG capsule Take 2 capsules (200 mg total) by mouth daily. 11/20/17 11/20/18  Barton Dubois, MD  DULoxetine (CYMBALTA) 30 MG capsule Take 90 mg by mouth daily.     [provider]  glipiZIDE (GLUCOTROL) 10 MG tablet Take 10 mg by mouth 2 (two) times daily.    [provider]  leuprolide Roylene Reason)  11.25 MG injection Inject 11.25 mg into the muscle every 4 (four) months.     [provider]  lisinopril-hydrochlorothiazide (PRINZIDE,ZESTORETIC) 10-12.5 MG tablet Take 1 tablet by mouth daily.     [provider]  metFORMIN (GLUCOPHAGE-XR) 500 MG 24 hr tablet Take 500 mg by mouth 2 (two) times daily.     [provider]  metoprolol succinate (TOPROL-XL) 25 MG 24 hr tablet Take 25 mg by mouth 2 (two) times daily.     [provider]  morphine (MSIR) 30 MG tablet Take 30 mg by mouth  every 8 (eight) hours as needed for pain. 10/13/17   [provider]  oxybutynin (DITROPAN) 5 MG tablet Take 5 mg by mouth 3 (three) times daily.     [provider]  psyllium (METAMUCIL MULTIHEALTH FIBER) 58.6 % powder Take 2 packets by mouth daily. 11/20/17   Barton Dubois, MD  simvastatin (ZOCOR) 10 MG tablet Take 10 mg by mouth at bedtime. 06/30/17   [provider]  traZODone (DESYREL) 50 MG tablet Take 50 mg by mouth at bedtime.    [provider]  warfarin (COUMADIN) 10 MG tablet Take 1 tablet (10 mg total) by mouth every evening. Hold until 01/04/2018 01/01/18   Patrecia Pour, MD  warfarin (COUMADIN) 2.5 MG tablet Take 1 tablet (2.5 mg total) by mouth at bedtime. Hold until 01/04/2018 01/01/18   Patrecia Pour, MD    Family History Family History  Problem Relation Age of Onset  . Diabetes Mother   . Cancer Father   . Cancer Paternal Uncle        prostate    Social History Social History   Tobacco Use  . Smoking status: Current Every Day Smoker    Packs/day: 0.50    Types: Cigarettes  . Smokeless tobacco: Never Used  Substance Use Topics  . Alcohol use: Not Currently    Frequency: Never    Comment: occasionally  . Drug use: No     Allergies   Patient has no known allergies.   Review of Systems Review of Systems  Gastrointestinal: Positive for abdominal pain and constipation.  All other systems reviewed and are negative.    Physical Exam Updated Vital Signs BP 135/78 (BP Location: Left Arm)   Pulse (!) 106   Temp 97.7 F (36.5 C) (Oral)   Resp 20   Ht 6\' 3"  (1.905 m)   Wt (!) 138.3 kg   SpO2 92%   BMI 38.12 kg/m   Physical Exam Vitals signs and nursing note reviewed.  Constitutional:      General: He is not in acute distress.    Appearance: Normal appearance. He is well-developed.  HENT:     Head: Normocephalic and atraumatic.     Right Ear: Hearing normal.     Left Ear: Hearing normal.     Nose: Nose normal.    Eyes:     Conjunctiva/sclera: Conjunctivae normal.     Pupils: Pupils are equal, round, and reactive to light.  Neck:     Musculoskeletal: Normal range of motion and neck supple.  Cardiovascular:     Rate and Rhythm: Regular rhythm.     Heart sounds: S1 normal and S2 normal. No murmur. No friction rub. No gallop.   Pulmonary:     Effort: Pulmonary effort is normal. No respiratory distress.     Breath sounds: Normal breath sounds.  Chest:     Chest wall: No tenderness.  Abdominal:  General: Bowel sounds are normal.     Palpations: Abdomen is soft.     Tenderness: There is generalized abdominal tenderness. There is no guarding or rebound. Negative signs include Murphy's sign and McBurney's sign.     Hernia: No hernia is present.  Musculoskeletal: Normal range of motion.  Skin:    General: Skin is warm and dry.     Findings: No rash.  Neurological:     Mental Status: He is alert and oriented to person, place, and time.     GCS: GCS eye subscore is 4. GCS verbal subscore is 5. GCS motor subscore is 6.     Cranial Nerves: No cranial nerve deficit.     Sensory: No sensory deficit.     Coordination: Coordination normal.  Psychiatric:        Speech: Speech normal.        Behavior: Behavior normal.        Thought Content: Thought content normal.      ED Treatments / Results  Labs (all labs ordered are listed, but only abnormal results are displayed) Labs Reviewed  CBC WITH DIFFERENTIAL/PLATELET - Abnormal; Notable for the following components:      Result Value   Hemoglobin 11.4 (*)    HCT 37.8 (*)    MCH 25.2 (*)    All other components within normal limits  COMPREHENSIVE METABOLIC PANEL - Abnormal; Notable for the following components:   Sodium 133 (*)    Chloride 97 (*)    Glucose, Bld 153 (*)    BUN 30 (*)    Calcium 8.3 (*)    Total Protein 6.2 (*)    All other components within normal limits  PROTIME-INR - Abnormal; Notable for the following components:    Prothrombin Time 38.4 (*)    INR 4.01 (*)    All other components within normal limits  LIPASE, BLOOD  URINALYSIS, ROUTINE W REFLEX MICROSCOPIC  LACTIC ACID, PLASMA    EKG EKG Interpretation  Date/Time:  Tuesday February 14 2018 03:39:29 EST Ventricular Rate:  108 PR Interval:    QRS Duration: 152 QT Interval:  440 QTC Calculation: 590 R Axis:   0 Text Interpretation:  Atrial flutter with predominant 2:1 AV block IVCD, consider atypical RBBB Baseline wander in lead(s) V2 No significant change since last tracing Confirmed by Orpah Greek [phone]) on 02/14/2018 4:16:40 AM   Radiology Ct Abdomen Pelvis W Contrast  Result Date: 02/14/2018 CLINICAL DATA:  Acute onset of periumbilical and bilateral lower quadrant abdominal pain. No recent bowel movements. EXAM: CT ABDOMEN AND PELVIS WITH CONTRAST TECHNIQUE: Multidetector CT imaging of the abdomen and pelvis was performed using the standard protocol following bolus administration of intravenous contrast. CONTRAST:  145mL ISOVUE-300 IOPAMIDOL (ISOVUE-300) INJECTION 61% COMPARISON:  CT of the abdomen and pelvis performed 12/30/2017 FINDINGS: Lower chest: Minimal bibasilar atelectasis or scarring is noted. The visualized portions of the mediastinum are unremarkable. Hepatobiliary: The liver is unremarkable in appearance. Stones are suggested within the gallbladder. The gallbladder is otherwise unremarkable. The common bile duct is normal in caliber. Pancreas: The pancreas is within normal limits. Spleen: A 1.2 cm cystic focus is noted at the anterior aspect of the spleen. Adrenals/Urinary Tract: The adrenal glands are unremarkable in appearance. Nonspecific perinephric stranding is noted bilaterally. There is no evidence of hydronephrosis. No renal or ureteral stones are identified. Stomach/Bowel: The stomach is unremarkable in appearance. There is herniation of 2 segments of small bowel into a right lower quadrant  anterior abdominal hernia,  with incarceration and associated free fluid. Underlying mesenteric edema is noted, raising concern for some degree of strangulation. There is no definite evidence for bowel obstruction at this time. The small bowel is otherwise unremarkable. The appendix is normal in caliber, without evidence of appendicitis. The colon is unremarkable in appearance. Vascular/Lymphatic: Scattered calcification is seen along the abdominal aorta and its branches. The abdominal aorta is otherwise grossly unremarkable. The inferior vena cava is grossly unremarkable. No retroperitoneal lymphadenopathy is seen. No pelvic sidewall lymphadenopathy is identified. Reproductive: The bladder is mildly distended and grossly unremarkable. The patient is status post prostatectomy. Other: No additional soft tissue abnormalities are seen. Musculoskeletal: No acute osseous abnormalities are identified. There is slight chronic loss of height at vertebral body L1. The visualized musculature is unremarkable in appearance. IMPRESSION: 1. Herniation of 2 segments of small bowel into a right lower quadrant anterior abdominal hernia, with incarceration and associated free fluid. Underlying mesenteric edema noted, raising concern for some degree of strangulation. No definite evidence for bowel obstruction at this time. 2. Cholelithiasis. Gallbladder otherwise unremarkable. 3. 1.2 cm cystic focus at the anterior aspect of the spleen, relatively stable from 2017 and likely benign. Aortic Atherosclerosis (ICD10-I70.0). These results were called by telephone at the time of interpretation on 02/14/2018 at 5:26 am to Dr. Joseph Berkshire, who verbally acknowledged these results. Electronically Signed   By: Garald Balding M.D.   On: 02/14/2018 05:28    Procedures Procedures (including critical care time)  Medications Ordered in ED Medications  ondansetron (ZOFRAN) tablet 4 mg (has no administration in time range)    Or  ondansetron (ZOFRAN) injection 4  mg (has no administration in time range)  acetaminophen (TYLENOL) tablet 650 mg (has no administration in time range)    Or  acetaminophen (TYLENOL) suppository 650 mg (has no administration in time range)  HYDROmorphone (DILAUDID) injection 1 mg (has no administration in time range)  famotidine (PEPCID) IVPB 20 mg premix (has no administration in time range)  prochlorperazine (COMPAZINE) injection 10 mg (has no administration in time range)  nicotine (NICODERM CQ - dosed in mg/24 hours) patch 21 mg (has no administration in time range)  sodium chloride 0.9 % bolus 500 mL (0 mLs Intravenous Stopped 02/14/18 0421)  HYDROmorphone (DILAUDID) injection 1 mg (1 mg Intravenous Given 02/14/18 0344)  ondansetron (ZOFRAN) injection 4 mg (4 mg Intravenous Given 02/14/18 0344)  iopamidol (ISOVUE-300) 61 % injection 100 mL (100 mLs Intravenous Contrast Given 02/14/18 0446)  LORazepam (ATIVAN) tablet 1 mg (1 mg Oral Given 02/14/18 0440)     Initial Impression / Assessment and Plan / ED Course  I have reviewed the triage vital signs and the nursing notes.  Pertinent labs & imaging results that were available during my care of the patient were reviewed by me and considered in my medical decision making (see chart for details).     Patient presents to the emergency department for evaluation of abdominal pain.  Patient reports a history of recurrent bowel obstructions in the past.  He thinks he might have an obstruction today.  He has been feeling constipated, has not had a bowel movement in 4 days.  He has tried multiple laxatives and bowel preps without result.  Patient is afebrile.  He does not have a leukocytosis.  Lactic acid is normal.  Remainder of blood work was unremarkable.  Patient underwent CT scan to further evaluate.  CT shows evidence of right lower abdominal wall hernia with  incarcerated bowel.  Patient's examination was difficult at arrival because he has a large obese and lumpy abdomen.   I was able to identify the area of hernia and is able to gently milk and eventually reduce the hernia.  Discussed with Dr. Constance Haw, general surgery.  No urgent intervention necessary, will admit to hospitalist and she will see this morning.  Final Clinical Impressions(s) / ED Diagnoses   Final diagnoses:  Other specified abdominal hernia with obstruction and without gangrene    ED Discharge Orders    None       Orpah Greek, MD 02/14/18 [phone]

## 2018-02-14 NOTE — Progress Notes (Signed)
[facility]  Patient seen earlier. Full note to follow. Abd with reducible hernia in the right of midline lower abdomen. Tender but no rebound or guarding in the area.  No leukocytosis, no lactic acid. Minimal stranding in the mesentery of the bowel.  Reported to have no BM for a few days leading up to this episode.  Takes some laxative at times.  No vomiting. Some nausea. Does not look like a bowel obstruction but was likely progressing towards that as in the past.   Ok to have clears. Adv as tolerated.  Laxative ordered. Monitor exam.   Curlene Labrum, MD [facility] [address] [place], [place] [address] [phone] (office)

## 2018-02-14 NOTE — ED Triage Notes (Signed)
Pt c/o abdominal pain and states he has not had a BM x 4 days; pt has a hx of SBO

## 2018-02-14 NOTE — Progress Notes (Signed)
PROGRESS NOTE  Dale Franklin VCB:[id number] DOB: 11-Apr-1960 DOA: 02/14/2018 PCP: Karsten Ro, DO  Brief History:  57 year old male with a history of bowel obstruction, COPD, drug abuse, depression, diabetes mellitus, atrial flutter/atrial fibrillation, hypertension, prostate cancer presenting with 4-day history of abdominal pain.  The patient stated that he tried using his friend's GoLYTELY bowel prep on 02/11/2017 without success.  He continued to have abdominal distention pain.  As result, presented for further evaluation.  He denied any fevers, chills, chest pain, shortness breath, hematochezia, melena.  Upon presentation, patient was afebrile hemodynamically stable but was noted to have atrial flutter with heart rate 108.  CT of the abdomen and pelvis showed herniation of 2 segments of small bowel into the right lower quadrant anterior abdominal hernia consistent with incarceration.  General surgery was consulted to assist with management.  Notably, the patient had a recent admission from 12/30/2017 through 01/01/2018 during which she was treated for proctocolitis with rectal bleeding.  He was found to have a bleeding rectal telangiectasia causing acute on chronic blood loss anemia.  Assessment/Plan: Incarcerated ventral hernia -General surgery consulted -Keep n.p.o. for now -Antiemetics and analgesics  Diabetes mellitus type 2 -NovoLog sliding scale -Holding metformin and glipizide  Chronic atrial fibrillation -IV Lopressor while the patient is n.p.o. -Presented with INR 4.01 -Coumadin can be reversed with FFP and vitamin K if the patient needs to go to surgery -Holding Coumadin  Essential hypertension -IV Lopressor as discussed -Holding lisinopril/HCTZ until the patient is able to tolerate p.o.  COPD -Stable on room air  Chronic left shoulder pain -Patient follows pain management -Patient normally takes morphine 30 mg 3 times daily at home -Transition to IV  morphine at this time for pain control  Tobacco abuse -Tobacco cessation discussed -NicoDerm patch -40-pack-year history  Prostate CAs/p TURP: ?radiation proctitis.  - Continue oxybutynin when able to tolerate po  -no recent BOO,LUTS    Disposition Plan:   Home in 2-3 days  Family Communication:  No Family at bedside  Consultants: General surgery  Code Status:  FULL  DVT Prophylaxis: Coumadin on hold.  Currently supratherapeutic INR   Procedures: As Listed in Progress Note Above  Antibiotics: None  Total time spent 35 minutes.  Greater than 50% spent face to face counseling and coordinating care. 0800 to 0835      Subjective: Patient complains of abdominal pain.  He denies any vomiting, diarrhea, hematochezia, melena, dysuria, hematuria.  He has some nausea.  He denies any chest pain, shortness breath, hemoptysis, headaches, fevers, chills.  Objective: Vitals:   02/14/18 0745 02/14/18 0748 02/14/18 0749 02/14/18 0815  BP:   112/75 102/60  Pulse:  (!) 107 92 77  Resp:    18  Temp: 97.7 F (36.5 C)   98.3 F (36.8 C)  TempSrc:    Oral  SpO2:  91% 90% 94%  Weight:    (!) 143.1 kg  Height:    6\' 3"  (1.905 m)    Intake/Output Summary (Last 24 hours) at 02/14/2018 0827 Last data filed at 02/14/2018 0421 Gross per 24 hour  Intake 500 ml  Output -  Net 500 ml   Weight change:  Exam:   General:  Pt is alert, follows commands appropriately, not in acute distress  HEENT: No icterus, No thrush, No neck mass, [place]/AT  Cardiovascular: IRRR, S1/S2, no rubs, no gallops  Respiratory: Diminished breath sounds bilateral.  Bibasilar crackles.  No wheezing.  Abdomen: Soft/+BS, non tender, non distended, no guarding  Extremities: Trace lower extremity edema, No lymphangitis, No petechiae, No rashes, no synovitis   Data Reviewed: I have personally reviewed following labs and imaging studies Basic Metabolic Panel: Recent Labs  Lab 02/14/18 0346  NA 133*  K  4.2  CL 97*  CO2 29  GLUCOSE 153*  BUN 30*  CREATININE 0.92  CALCIUM 8.3*   Liver Function Tests: Recent Labs  Lab 02/14/18 0346  AST 23  ALT 26  ALKPHOS 118  BILITOT 0.4  PROT 6.2*  ALBUMIN 3.6   Recent Labs  Lab 02/14/18 0346  LIPASE 29   No results for input(s): AMMONIA in the last 168 hours. Coagulation Profile: Recent Labs  Lab 02/14/18 0346  INR 4.01*   CBC: Recent Labs  Lab 02/14/18 0346  WBC 8.7  NEUTROABS 5.3  HGB 11.4*  HCT 37.8*  MCV 83.4  PLT 153   Cardiac Enzymes: No results for input(s): CKTOTAL, CKMB, CKMBINDEX, TROPONINI in the last 168 hours. BNP: Invalid input(s): POCBNP CBG: No results for input(s): GLUCAP in the last 168 hours. HbA1C: No results for input(s): HGBA1C in the last 72 hours. Urine analysis:    Component Value Date/Time   COLORURINE YELLOW 02/14/2018 0329   APPEARANCEUR CLEAR 02/14/2018 0329   LABSPEC 1.015 02/14/2018 0329   PHURINE 5.0 02/14/2018 0329   GLUCOSEU NEGATIVE 02/14/2018 0329   HGBUR NEGATIVE 02/14/2018 0329   BILIRUBINUR NEGATIVE 02/14/2018 0329   KETONESUR NEGATIVE 02/14/2018 0329   PROTEINUR NEGATIVE 02/14/2018 0329   NITRITE NEGATIVE 02/14/2018 0329   LEUKOCYTESUR NEGATIVE 02/14/2018 0329   Sepsis Labs: @LABRCNTIP (procalcitonin:4,lacticidven:4) )No results found for this or any previous visit (from the past 240 hour(s)).   Scheduled Meds: Continuous Infusions: . famotidine (PEPCID) IV      Procedures/Studies: Ct Abdomen Pelvis W Contrast  Result Date: 02/14/2018 CLINICAL DATA:  Acute onset of periumbilical and bilateral lower quadrant abdominal pain. No recent bowel movements. EXAM: CT ABDOMEN AND PELVIS WITH CONTRAST TECHNIQUE: Multidetector CT imaging of the abdomen and pelvis was performed using the standard protocol following bolus administration of intravenous contrast. CONTRAST:  144mL ISOVUE-300 IOPAMIDOL (ISOVUE-300) INJECTION 61% COMPARISON:  CT of the abdomen and pelvis performed  12/30/2017 FINDINGS: Lower chest: Minimal bibasilar atelectasis or scarring is noted. The visualized portions of the mediastinum are unremarkable. Hepatobiliary: The liver is unremarkable in appearance. Stones are suggested within the gallbladder. The gallbladder is otherwise unremarkable. The common bile duct is normal in caliber. Pancreas: The pancreas is within normal limits. Spleen: A 1.2 cm cystic focus is noted at the anterior aspect of the spleen. Adrenals/Urinary Tract: The adrenal glands are unremarkable in appearance. Nonspecific perinephric stranding is noted bilaterally. There is no evidence of hydronephrosis. No renal or ureteral stones are identified. Stomach/Bowel: The stomach is unremarkable in appearance. There is herniation of 2 segments of small bowel into a right lower quadrant anterior abdominal hernia, with incarceration and associated free fluid. Underlying mesenteric edema is noted, raising concern for some degree of strangulation. There is no definite evidence for bowel obstruction at this time. The small bowel is otherwise unremarkable. The appendix is normal in caliber, without evidence of appendicitis. The colon is unremarkable in appearance. Vascular/Lymphatic: Scattered calcification is seen along the abdominal aorta and its branches. The abdominal aorta is otherwise grossly unremarkable. The inferior vena cava is grossly unremarkable. No retroperitoneal lymphadenopathy is seen. No pelvic sidewall lymphadenopathy is identified. Reproductive: The bladder is mildly distended and grossly unremarkable. The  patient is status post prostatectomy. Other: No additional soft tissue abnormalities are seen. Musculoskeletal: No acute osseous abnormalities are identified. There is slight chronic loss of height at vertebral body L1. The visualized musculature is unremarkable in appearance. IMPRESSION: 1. Herniation of 2 segments of small bowel into a right lower quadrant anterior abdominal hernia,  with incarceration and associated free fluid. Underlying mesenteric edema noted, raising concern for some degree of strangulation. No definite evidence for bowel obstruction at this time. 2. Cholelithiasis. Gallbladder otherwise unremarkable. 3. 1.2 cm cystic focus at the anterior aspect of the spleen, relatively stable from 2017 and likely benign. Aortic Atherosclerosis (ICD10-I70.0). These results were called by telephone at the time of interpretation on 02/14/2018 at 5:26 am to Dr. Joseph Berkshire, who verbally acknowledged these results. Electronically Signed   By: Garald Balding M.D.   On: 02/14/2018 05:28    Orson Eva, DO  [facility] Pager [phone]  If 7PM-7AM, please contact night-coverage [email or web address] Password [email or web address] 02/14/2018, 8:27 AM   LOS: 0 days

## 2018-02-14 NOTE — Care Management Obs Status (Signed)
[place] NOTIFICATION   Patient Details  Name: Dale Franklin MRN: [id number] Date of Birth: [birth year]   Medicare Observation Status Notification Given:  Yes    Dariel Pellecchia, Chauncey Reading, RN 02/14/2018, 2:15 PM

## 2018-02-14 NOTE — H&P (Signed)
[facility] History and Physical  Reason for Referral: Hernia  Referring Physician:  Dr. Betsey Holiday MD (ED)   Chief Complaint    Abdominal Pain      Dale Franklin is a 57 y.o. male.  HPI: Dale Franklin is a 57 yo with COPD, depression, DM, A flutter on coumadin who presented to the hospital with lower abdominal pain and some nausea but no vomiting. He reports not having a BM since Friday/ Saturday and that he felt that he was getting another bowel obstruction. He has had multiple obstructions in the past related to his hernia and all have been managed non operatively with NG decompression and reduction of the hernia. He was seen in June 2019 by [facility] and managed with NG decompression and they felt that his hernia needed a specialist.  He was seen by my partner Dr. Arnoldo Morale 09/2017 and was also managed non operatively.  He says that he was starting to have the same issues as prior and he came in to the Ed to keep from having an obstruction.  He has a history of 5 abdominal surgeries based on his report and prior documentation. He says that he had a "bleed" ? Likely gastric bleeding and subsequent dehiscence of the wound with temporary mesh placement. He then underwent a hernia repair with mesh and it appears that all of this was performed open.  He had these surgeries done in [place].  Since June, he has had 2 obstructions and has been seen by hernia specialist at [facility] in [place] and was told to lose 40 lbs to prepare for hernia repair surgery.   He says he has lost about 20 lbs. Currently his abdominal pain has improved.  Says he takes miralax at home for constipation.   Past Medical History:  Diagnosis Date  . Arthritis   . Bowel obstruction ([place])   . COPD (chronic obstructive pulmonary disease) ([place])   . Depression   . Diabetes mellitus without complication ([place])   . Dysrhythmia    A flutter  . Hypertension   . Prostate cancer [facility])     Past  Surgical History:  Procedure Laterality Date  . COLONOSCOPY WITH PROPOFOL N/A 11/04/2017   Procedure: COLONOSCOPY WITH PROPOFOL;  Surgeon: Rogene Houston, MD;  [facility];  Service: Endoscopy;  Laterality: N/A;  2:25  . EXPLORATORY LAPAROTOMY     gastric bleed with subsequent dehiscence, reoperation and temporary mesh (in [place])   . FLEXIBLE SIGMOIDOSCOPY N/A 12/30/2017   Procedure: FLEXIBLE SIGMOIDOSCOPY;  Surgeon: Rogene Houston, MD;  [facility];  Service: Endoscopy;  Laterality: N/A;  . GOLD SEED IMPLANT N/A 05/31/2016   Procedure: GOLD SEED IMPLANT;  Surgeon: Franchot Gallo, MD;  Location: [facility];  Service: Urology;  Laterality: N/A;  . HAND SURGERY Right   . HERNIA REPAIR     permenant mesh hernia repair after initial Ex lap   . POLYPECTOMY  11/04/2017   Procedure: POLYPECTOMY;  Surgeon: Rogene Houston, MD;  Location[facility];  Service: Endoscopy;;  cecal polyp (CSx1), transverse colon (CSx2),recto-sigmoid(CSx1)  . TRANSURETHRAL RESECTION OF PROSTATE N/A 05/31/2016   Procedure: TRANSURETHRAL RESECTION OF THE PROSTATE (TURP);  Surgeon: Franchot Gallo, MD;  Location: [facility];  Service: Urology;  Laterality: N/A;  . WRIST SURGERY Right     Family History  Problem Relation Age of Onset  . Diabetes Mother   . Cancer Father   . Cancer Paternal Uncle  prostate    Social History   Tobacco Use  . Smoking status: Current Every Day Smoker    Packs/day: 0.50    Types: Cigarettes  . Smokeless tobacco: Never Used  Substance Use Topics  . Alcohol use: Not Currently    Frequency: Never    Comment: occasionally  . Drug use: No    Medications:  I have reviewed the patient's current medications. Prior to Admission:  Medications Prior to Admission  Medication Sig Dispense Refill Last Dose  . albuterol (PROVENTIL HFA;VENTOLIN HFA) 108 (90 Base) MCG/ACT inhaler Inhale 1-2 puffs into the lungs every 6 (six) hours as needed for wheezing or  shortness of breath.   unknown  . docusate sodium (COLACE) 100 MG capsule Take 2 capsules (200 mg total) by mouth daily. 30 capsule 1   . DULoxetine (CYMBALTA) 30 MG capsule Take 90 mg by mouth daily.   5 12/29/2017 at Unknown time  . glipiZIDE (GLUCOTROL) 10 MG tablet Take 10 mg by mouth 2 (two) times daily.   12/29/2017 at Unknown time  . leuprolide (LUPRON) 11.25 MG injection Inject 11.25 mg into the muscle every 4 (four) months.    Past Month at Unknown time  . lisinopril-hydrochlorothiazide (PRINZIDE,ZESTORETIC) 10-12.5 MG tablet Take 1 tablet by mouth daily.   12 12/29/2017 at Unknown time  . metFORMIN (GLUCOPHAGE-XR) 500 MG 24 hr tablet Take 500 mg by mouth 2 (two) times daily.   3 12/29/2017 at Unknown time  . metoprolol succinate (TOPROL-XL) 25 MG 24 hr tablet Take 25 mg by mouth 2 (two) times daily.   3 12/29/2017 at 2100  . morphine (MSIR) 30 MG tablet Take 30 mg by mouth every 8 (eight) hours as needed for pain.  0 12/29/2017 at Unknown time  . oxybutynin (DITROPAN) 5 MG tablet Take 5 mg by mouth 3 (three) times daily.    12/29/2017 at Unknown time  . psyllium (METAMUCIL MULTIHEALTH FIBER) 58.6 % powder Take 2 packets by mouth daily. 283 g 3   . simvastatin (ZOCOR) 10 MG tablet Take 10 mg by mouth at bedtime.  12 12/29/2017 at Unknown time  . traZODone (DESYREL) 50 MG tablet Take 50 mg by mouth at bedtime.   12/29/2017 at Unknown time  . warfarin (COUMADIN) 10 MG tablet Take 1 tablet (10 mg total) by mouth every evening. Hold until 01/04/2018     . warfarin (COUMADIN) 2.5 MG tablet Take 1 tablet (2.5 mg total) by mouth at bedtime. Hold until 01/04/2018      Scheduled: . insulin aspart  0-9 Units Subcutaneous Q4H  . metoprolol tartrate  2.5 mg Intravenous Q6H  . polyethylene glycol  17 g Oral Daily   Continuous: . famotidine (PEPCID) IV 20 mg (02/14/18 1010)   PJA:[id number] **OR** acetaminophen, morphine injection, nicotine, ondansetron **OR** ondansetron (ZOFRAN) IV  No  Known Allergies   ROS:  A comprehensive review of systems was negative except for: Gastrointestinal: positive for abdominal pain, constipation and nausea  Blood pressure 102/60, pulse 77, temperature 98.3 F (36.8 C), temperature source Oral, resp. rate 18, height 6\' 3"  (1.905 m), weight (!) 143.1 kg, SpO2 94 %. Physical Exam Vitals signs reviewed.  Constitutional:      Appearance: He is well-developed.  HENT:     Head: Normocephalic and atraumatic.  Eyes:     Extraocular Movements: Extraocular movements intact.  Cardiovascular:     Rate and Rhythm: Normal rate. Rhythm irregular.  Pulmonary:     Effort: Pulmonary effort is normal.  Breath sounds: Normal breath sounds.  Abdominal:     Palpations: Abdomen is soft.     Tenderness: There is abdominal tenderness in the periumbilical area and suprapubic area. There is no guarding or rebound.     Comments: Midline incision from sternum to pubic area, large pannus, right of umbilicus just inferior hernia defect, reducible hernia, some tenderness, no rebound or guarding  Skin:    General: Skin is warm and dry.  Neurological:     General: No focal deficit present.     Mental Status: He is alert and oriented to person, place, and time.  Psychiatric:        Mood and Affect: Mood normal.        Behavior: Behavior normal.     Results: Results for orders placed or performed during the hospital encounter of 02/14/18 (from the past 48 hour(s))  Urinalysis, Routine w reflex microscopic     Status: None   Collection Time: 02/14/18  3:29 AM  Result Value Ref Range   Color, Urine YELLOW YELLOW   APPearance CLEAR CLEAR   Specific Gravity, Urine 1.015 1.005 - 1.030   pH 5.0 5.0 - 8.0   Glucose, UA NEGATIVE NEGATIVE mg/dL   Hgb urine dipstick NEGATIVE NEGATIVE   Bilirubin Urine NEGATIVE NEGATIVE   Ketones, ur NEGATIVE NEGATIVE mg/dL   Protein, ur NEGATIVE NEGATIVE mg/dL   Nitrite NEGATIVE NEGATIVE   Leukocytes, UA NEGATIVE NEGATIVE     Comment: Performed at [facility], [address] [place], [place] [address]  CBC with Differential/Platelet     Status: Abnormal   Collection Time: 02/14/18  3:46 AM  Result Value Ref Range   WBC 8.7 4.0 - 10.5 K/uL   RBC 4.53 4.22 - 5.81 MIL/uL   Hemoglobin 11.4 (L) 13.0 - 17.0 g/dL   HCT 37.8 (L) 39.0 - 52.0 %   MCV 83.4 80.0 - 100.0 fL   MCH 25.2 (L) 26.0 - 34.0 pg   MCHC 30.2 30.0 - 36.0 g/dL   RDW 15.5 11.5 - 15.5 %   Platelets 153 150 - 400 K/uL   nRBC 0.0 0.0 - 0.2 %   Neutrophils Relative % 61 %   Neutro Abs 5.3 1.7 - 7.7 K/uL   Lymphocytes Relative 28 %   Lymphs Abs 2.4 0.7 - 4.0 K/uL   Monocytes Relative 8 %   Monocytes Absolute 0.7 0.1 - 1.0 K/uL   Eosinophils Relative 3 %   Eosinophils Absolute 0.2 0.0 - 0.5 K/uL   Basophils Relative 0 %   Basophils Absolute 0.0 0.0 - 0.1 K/uL   Immature Granulocytes 0 %   Abs Immature Granulocytes 0.02 0.00 - 0.07 K/uL    Comment: Performed at [facility], [address] [place], [place] [address]  Comprehensive metabolic panel     Status: Abnormal   Collection Time: 02/14/18  3:46 AM  Result Value Ref Range   Sodium 133 (L) 135 - 145 mmol/L   Potassium 4.2 3.5 - 5.1 mmol/L   Chloride 97 (L) 98 - 111 mmol/L   CO2 29 22 - 32 mmol/L   Glucose, Bld 153 (H) 70 - 99 mg/dL   BUN 30 (H) 6 - 20 mg/dL   Creatinine, Ser 0.92 0.61 - 1.24 mg/dL   Calcium 8.3 (L) 8.9 - 10.3 mg/dL   Total Protein 6.2 (L) 6.5 - 8.1 g/dL   Albumin 3.6 3.5 - 5.0 g/dL   AST 23 15 - 41 U/L   ALT 26  0 - 44 U/L   Alkaline Phosphatase 118 38 - 126 U/L   Total Bilirubin 0.4 0.3 - 1.2 mg/dL   GFR calc non Af Amer >60 >60 mL/min   GFR calc Af Amer >60 >60 mL/min   Anion gap 7 5 - 15    Comment: Performed at [facility], [address], [place], [place] [address]  Lipase, blood     Status: None   Collection Time: 02/14/18  3:46 AM  Result Value Ref Range   Lipase 29 11 - 51 U/L    Comment: Performed at [facility], [address], [place], [place] [address]   Lactic acid, plasma     Status: None   Collection Time: 02/14/18  3:46 AM  Result Value Ref Range   Lactic Acid, Venous 1.1 0.5 - 1.9 mmol/L    Comment: Performed at [facility], [address], [place], [place] [address]  Protime-INR     Status: Abnormal   Collection Time: 02/14/18  3:46 AM  Result Value Ref Range   Prothrombin Time 38.4 (H) 11.4 - 15.2 seconds   INR 4.01 (HH)     Comment: RESULT REPEATED AND VERIFIED CRITICAL RESULT CALLED TO, READ BACK BY AND VERIFIED WITH: WILEY,EBONY BY HUFFINES,S ON 02/14/18 AT 0702. Performed at [facility], [address] [place], [place] [address]   Glucose, capillary     Status: Abnormal   Collection Time: 02/14/18  9:08 AM  Result Value Ref Range   Glucose-Capillary 100 (H) 70 - 99 mg/dL    Personally reviewed- small bowel and maybe some large bowel in the hernia, minimal if any stranding, looked at prior CT a/p and similar appearance of hernia, no obstruction or dilation noted of the small bowel, some stool in colon   Ct Abdomen Pelvis W Contrast  Result Date: 02/14/2018 CLINICAL DATA:  Acute onset of periumbilical and bilateral lower quadrant abdominal pain. No recent bowel movements. EXAM: CT ABDOMEN AND PELVIS WITH CONTRAST TECHNIQUE: Multidetector CT imaging of the abdomen and pelvis was performed using the standard protocol following bolus administration of intravenous contrast. CONTRAST:  130mL ISOVUE-300 IOPAMIDOL (ISOVUE-300) INJECTION 61% COMPARISON:  CT of the abdomen and pelvis performed 12/30/2017 FINDINGS: Lower chest: Minimal bibasilar atelectasis or scarring is noted. The visualized portions of the mediastinum are unremarkable. Hepatobiliary: The liver is unremarkable in appearance. Stones are suggested within the gallbladder. The gallbladder is otherwise unremarkable. The common bile duct is normal in caliber. Pancreas: The pancreas is within normal limits. Spleen: A 1.2 cm cystic focus is noted at the anterior aspect of the  spleen. Adrenals/Urinary Tract: The adrenal glands are unremarkable in appearance. Nonspecific perinephric stranding is noted bilaterally. There is no evidence of hydronephrosis. No renal or ureteral stones are identified. Stomach/Bowel: The stomach is unremarkable in appearance. There is herniation of 2 segments of small bowel into a right lower quadrant anterior abdominal hernia, with incarceration and associated free fluid. Underlying mesenteric edema is noted, raising concern for some degree of strangulation. There is no definite evidence for bowel obstruction at this time. The small bowel is otherwise unremarkable. The appendix is normal in caliber, without evidence of appendicitis. The colon is unremarkable in appearance. Vascular/Lymphatic: Scattered calcification is seen along the abdominal aorta and its branches. The abdominal aorta is otherwise grossly unremarkable. The inferior vena cava is grossly unremarkable. No retroperitoneal lymphadenopathy is seen. No pelvic sidewall lymphadenopathy is identified. Reproductive: The bladder is mildly distended and grossly unremarkable. The patient is status post  prostatectomy. Other: No additional soft tissue abnormalities are seen. Musculoskeletal: No acute osseous abnormalities are identified. There is slight chronic loss of height at vertebral body L1. The visualized musculature is unremarkable in appearance. IMPRESSION: 1. Herniation of 2 segments of small bowel into a right lower quadrant anterior abdominal hernia, with incarceration and associated free fluid. Underlying mesenteric edema noted, raising concern for some degree of strangulation. No definite evidence for bowel obstruction at this time. 2. Cholelithiasis. Gallbladder otherwise unremarkable. 3. 1.2 cm cystic focus at the anterior aspect of the spleen, relatively stable from 2017 and likely benign. Aortic Atherosclerosis (ICD10-I70.0). These results were called by telephone at the time of  interpretation on 02/14/2018 at 5:26 am to Dr. Joseph Berkshire, who verbally acknowledged these results. Electronically Signed   By: Garald Balding M.D.   On: 02/14/2018 05:28     Assessment & Plan:  Tatum Massman is a 57 y.o. male with a reducible hernia that is not incarcerated. He has no signs of strangulation as he has a normal lactic acid and no leukocytosis. He is improving overall with the pain and says that he has not had any vomiting. The CT does not show a bowel obstruction.   -Diet as tolerated  -Miralax ordered  -Needs to follow up in [place] with the hernia specialist, as he will continue to have these issues  -No need for acute surgical intervention at this time  -Will follow along   All questions were answered to the satisfaction of the patient.   Virl Cagey 02/14/2018, 11:28 AM

## 2018-02-14 NOTE — ED Notes (Signed)
Moderate anxiety noted. Pt reassured and intermittently resting at this time.

## 2018-02-14 NOTE — ED Notes (Signed)
Surgeon at bedside.  

## 2018-02-14 NOTE — ED Notes (Signed)
CRITICAL VALUE ALERT  Critical Value: INR 4.01  Date & Time Notied:  02/02/18 0706  Provider Notified: Dr. Carles Collet   Orders Received/Actions taken: None yet

## 2018-02-15 DIAGNOSIS — K921 Melena: Secondary | ICD-10-CM

## 2018-02-15 DIAGNOSIS — K432 Incisional hernia without obstruction or gangrene: Secondary | ICD-10-CM

## 2018-02-15 DIAGNOSIS — Z720 Tobacco use: Secondary | ICD-10-CM | POA: Diagnosis not present

## 2018-02-15 DIAGNOSIS — I482 Chronic atrial fibrillation, unspecified: Secondary | ICD-10-CM | POA: Diagnosis not present

## 2018-02-15 DIAGNOSIS — E119 Type 2 diabetes mellitus without complications: Secondary | ICD-10-CM | POA: Diagnosis not present

## 2018-02-15 DIAGNOSIS — K450 Other specified abdominal hernia with obstruction, without gangrene: Secondary | ICD-10-CM | POA: Diagnosis not present

## 2018-02-15 LAB — GLUCOSE, CAPILLARY
GLUCOSE-CAPILLARY: 198 mg/dL — AB (ref 70–99)
Glucose-Capillary: 109 mg/dL — ABNORMAL HIGH (ref 70–99)
Glucose-Capillary: 94 mg/dL (ref 70–99)

## 2018-02-15 LAB — COMPREHENSIVE METABOLIC PANEL
ALT: 28 U/L (ref 0–44)
ANION GAP: 8 (ref 5–15)
AST: 23 U/L (ref 15–41)
Albumin: 3.7 g/dL (ref 3.5–5.0)
Alkaline Phosphatase: 108 U/L (ref 38–126)
BUN: 19 mg/dL (ref 6–20)
CO2: 27 mmol/L (ref 22–32)
Calcium: 8.4 mg/dL — ABNORMAL LOW (ref 8.9–10.3)
Chloride: 99 mmol/L (ref 98–111)
Creatinine, Ser: 0.87 mg/dL (ref 0.61–1.24)
GFR calc Af Amer: >60 mL/min (ref >60)
GFR calc non Af Amer: >60 mL/min (ref >60)
Glucose, Bld: 132 mg/dL — ABNORMAL HIGH (ref 70–99)
Potassium: 4.4 mmol/L (ref 3.5–5.1)
Sodium: 134 mmol/L — ABNORMAL LOW (ref 135–145)
Total Bilirubin: 0.6 mg/dL (ref 0.3–1.2)
Total Protein: 6.5 g/dL (ref 6.5–8.1)

## 2018-02-15 LAB — PROTIME-INR
INR: 4.59
Prothrombin Time: 42.7 seconds — ABNORMAL HIGH (ref 11.4–15.2)

## 2018-02-15 LAB — CBC
HCT: 38.4 % — ABNORMAL LOW (ref 39.0–52.0)
Hemoglobin: 11.6 g/dL — ABNORMAL LOW (ref 13.0–17.0)
MCH: 25.4 pg — ABNORMAL LOW (ref 26.0–34.0)
MCHC: 30.2 g/dL (ref 30.0–36.0)
MCV: 84.2 fL (ref 80.0–100.0)
PLATELETS: 155 10*3/uL (ref 150–400)
RBC: 4.56 MIL/uL (ref 4.22–5.81)
RDW: 15.6 % — ABNORMAL HIGH (ref 11.5–15.5)
WBC: 9.1 10*3/uL (ref 4.0–10.5)
nRBC: 0 % (ref 0.0–0.2)

## 2018-02-15 MED ORDER — PHYTONADIONE 5 MG PO TABS
2.5000 mg | ORAL_TABLET | Freq: Once | ORAL | Status: DC
  Filled 2018-02-15: qty 1

## 2018-02-15 NOTE — Discharge Summary (Signed)
Physician Discharge Summary  Dale Franklin JGO:[id number] DOB: 09/05/1960 DOA: 02/14/2018  PCP: Karsten Ro, DO  Admit date: 02/14/2018 Discharge date: 02/15/2018  Admitted From: Home Disposition:  AGAINST MEDICAL ADVICE    Discharge Condition:AGAINST MEDICAL ADVICE CODE STATUS: FULL Diet recommendation: soft   Brief/Interim Summary: 58 year old male with a history of bowel obstruction, COPD, drug abuse, depression, diabetes mellitus, atrial flutter/atrial fibrillation, hypertension, prostate cancer presenting with 4-day history of abdominal pain.  The patient stated that he tried using his friend's GoLYTELY bowel prep on 02/11/2017 without success.  He continued to have abdominal distention pain.  As result, presented for further evaluation.  He denied any fevers, chills, chest pain, shortness breath, hematochezia, melena.  Upon presentation, patient was afebrile hemodynamically stable but was noted to have atrial flutter with heart rate 108.  CT of the abdomen and pelvis showed herniation of 2 segments of small bowel into the right lower quadrant anterior abdominal hernia consistent with incarceration.  General surgery was consulted to assist with management.  Notably, the patient had a recent admission from 12/30/2017 through 01/01/2018 during which she was treated for proctocolitis with rectal bleeding.  He was found to have a bleeding rectal telangiectasia causing acute on chronic blood loss anemia.  During this hospitalization, general surgery was consulted.  The patient's abdominal hernia was manually reduced.  Surgery continue to follow patient and did not feel the patient had any acute surgical needs at this time.  His diet was advanced which he tolerated.  Unfortunately, the patient had a supratherapeutic INR during that hospital admission.  On the evening of 02/14/2018, the patient had small-volume hematochezia.  In the setting of his coagulopathy, vitamin K was ordered.  However,  the patient was belligerent and regularly refused medical care including his medications and vitamin K.  On the morning of 02/15/2018, the patient wanted to leave [place].  In speaking with Dale Franklin, Dale Franklin has demonstrated the ability to understand his medical condition(s) which include coagulopathy, rectal bleeding, and abdominal hernia.  Dale Franklin has demonstrated the ability to appreciate how treatment for rectal bleeding and coagulopathy will be beneficial.   Dale Franklin has also demonstrated the ability to understand and appreciate how refusal of treatement for rectal bleeding and coagulopathy could result in harm, repeat hospitalization, and possibly death.  Dale Franklin demonstrates the ability to reason through the risks and benefits of the proposed treatment.  Finally, Dale Franklin is able to clearly communicate his/her choice.  Despite discussing the risks of leaving the hospital including but not limited to life-threatening bleed and death, the patient wanted to leave [place].   Discharge Diagnoses:  Incarcerated ventral hernia -General surgery consult appreciated>> no obstruction clinically on CT -Diet was advanced -Antiemetics and analgesics -The patient was rude and belligerent and wanted to leave AGAINST MEDICAL ADVICE  Diabetes mellitus type 2 -NovoLog sliding scale -Holding metformin and glipizide>> restart after discharge  Chronic atrial fibrillation -IV Lopressor while the patient is n.p.o. -Plan to transition the patient back to oral metoprolol, but the patient refused medications and wanted to leave [place] with INR 4.01>>> 4.59 -Holding Coumadin  Coagulopathy/supratherapeutic INR -Vitamin K was ordered -Patient refused all medications and wanted to leave [place] -Patient had recent admission for rectal telangiectasia and rectal bleeding -GI was consulted in  the setting of coagulopathy and continued rectal bleeding -The patient wanted to leave [place] and refused to take further medications  Essential hypertension -IV Lopressor as discussed -Holding lisinopril/HCTZ until the patient is able to tolerate p.o.  COPD -Stable on room air  Chronic left shoulder pain -Patient follows pain management -Patient normally takes morphine 30 mg 3 times daily at home -Transition to IV morphine at this time for pain control  Tobacco abuse -Tobacco cessation discussed -NicoDerm patch -40-pack-year history  Prostate CAs/p TURP: ?radiation proctitis.  - Continue oxybutynin when able to tolerate po  -no recent BOO,LUTS   Discharge Instructions   Allergies as of 02/15/2018   No Known Allergies     Medication List    TAKE these medications   albuterol 108 (90 Base) MCG/ACT inhaler Commonly known as:  PROVENTIL HFA;VENTOLIN HFA Inhale 1-2 puffs into the lungs every 6 (six) hours as needed for wheezing or shortness of breath.   docusate sodium 100 MG capsule Commonly known as:  COLACE Take 2 capsules (200 mg total) by mouth daily.   DULoxetine 30 MG capsule Commonly known as:  CYMBALTA Take 90 mg by mouth daily.   DULoxetine 60 MG capsule Commonly known as:  CYMBALTA TAKE ONE CAPSULE BY MOUTH DAILY. (MORNING)   furosemide 20 MG tablet Commonly known as:  LASIX Take 20 mg by mouth daily.   glipiZIDE 10 MG tablet Commonly known as:  GLUCOTROL Take 10 mg by mouth 2 (two) times daily.   leuprolide 11.25 MG injection Commonly known as:  LUPRON Inject 11.25 mg into the muscle every 4 (four) months.   lisinopril-hydrochlorothiazide 10-12.5 MG tablet Commonly known as:  PRINZIDE,ZESTORETIC Take 1 tablet by mouth daily.   metFORMIN 500 MG 24 hr tablet Commonly known as:  GLUCOPHAGE-XR Take 500 mg by mouth 2 (two) times daily.   metoprolol succinate 25 MG 24 hr tablet Commonly known as:  TOPROL-XL Take 25 mg by  mouth 2 (two) times daily.   morphine 30 MG tablet Commonly known as:  MSIR Take 30 mg by mouth every 8 (eight) hours as needed for pain.   oxybutynin 5 MG tablet Commonly known as:  DITROPAN Take 5 mg by mouth 3 (three) times daily.   psyllium 58.6 % powder Commonly known as:  METAMUCIL MULTIHEALTH FIBER Take 2 packets by mouth daily.   simvastatin 10 MG tablet Commonly known as:  ZOCOR Take 10 mg by mouth at bedtime.   traZODone 50 MG tablet Commonly known as:  DESYREL Take 50 mg by mouth at bedtime.   warfarin 10 MG tablet Commonly known as:  COUMADIN Take 1 tablet (10 mg total) by mouth every evening. Hold until 01/04/2018 What changed:  Another medication with the same name was changed. Make sure you understand how and when to take each.   warfarin 2.5 MG tablet Commonly known as:  COUMADIN Take 1 tablet (2.5 mg total) by mouth at bedtime. Hold until 01/04/2018 What changed:    when to take this  additional instructions       No Known Allergies  Consultations:  General surgery   Procedures/Studies: Ct Abdomen Pelvis W Contrast  Result Date: 02/14/2018 CLINICAL DATA:  Acute onset of periumbilical and bilateral lower quadrant abdominal pain. No recent bowel movements. EXAM: CT ABDOMEN AND PELVIS WITH CONTRAST TECHNIQUE: Multidetector CT imaging of the abdomen and pelvis was performed using the standard protocol following bolus administration of intravenous contrast. CONTRAST:  172mL ISOVUE-300 IOPAMIDOL (ISOVUE-300) INJECTION 61% COMPARISON:  CT of the abdomen and pelvis performed 12/30/2017 FINDINGS: Lower chest: Minimal bibasilar atelectasis or scarring is noted. The visualized portions  of the mediastinum are unremarkable. Hepatobiliary: The liver is unremarkable in appearance. Stones are suggested within the gallbladder. The gallbladder is otherwise unremarkable. The common bile duct is normal in caliber. Pancreas: The pancreas is within normal limits. Spleen:  A 1.2 cm cystic focus is noted at the anterior aspect of the spleen. Adrenals/Urinary Tract: The adrenal glands are unremarkable in appearance. Nonspecific perinephric stranding is noted bilaterally. There is no evidence of hydronephrosis. No renal or ureteral stones are identified. Stomach/Bowel: The stomach is unremarkable in appearance. There is herniation of 2 segments of small bowel into a right lower quadrant anterior abdominal hernia, with incarceration and associated free fluid. Underlying mesenteric edema is noted, raising concern for some degree of strangulation. There is no definite evidence for bowel obstruction at this time. The small bowel is otherwise unremarkable. The appendix is normal in caliber, without evidence of appendicitis. The colon is unremarkable in appearance. Vascular/Lymphatic: Scattered calcification is seen along the abdominal aorta and its branches. The abdominal aorta is otherwise grossly unremarkable. The inferior vena cava is grossly unremarkable. No retroperitoneal lymphadenopathy is seen. No pelvic sidewall lymphadenopathy is identified. Reproductive: The bladder is mildly distended and grossly unremarkable. The patient is status post prostatectomy. Other: No additional soft tissue abnormalities are seen. Musculoskeletal: No acute osseous abnormalities are identified. There is slight chronic loss of height at vertebral body L1. The visualized musculature is unremarkable in appearance. IMPRESSION: 1. Herniation of 2 segments of small bowel into a right lower quadrant anterior abdominal hernia, with incarceration and associated free fluid. Underlying mesenteric edema noted, raising concern for some degree of strangulation. No definite evidence for bowel obstruction at this time. 2. Cholelithiasis. Gallbladder otherwise unremarkable. 3. 1.2 cm cystic focus at the anterior aspect of the spleen, relatively stable from 2017 and likely benign. Aortic Atherosclerosis (ICD10-I70.0).  These results were called by telephone at the time of interpretation on 02/14/2018 at 5:26 am to Dr. Joseph Berkshire, who verbally acknowledged these results. Electronically Signed   By: Garald Balding M.D.   On: 02/14/2018 05:28        Discharge Exam: Vitals:   02/14/18 2111 02/15/18 0531  BP: 122/78 124/75  Pulse: 83 (!) 111  Resp: 20 18  Temp: 98.4 F (36.9 C)   SpO2: 96% 93%   Vitals:   02/14/18 0815 02/14/18 1301 02/14/18 2111 02/15/18 0531  BP: 102/60 129/79 122/78 124/75  Pulse: 77 (!) 112 83 (!) 111  Resp: 18 19 20 18   Temp: 98.3 F (36.8 C) 98.6 F (37 C) 98.4 F (36.9 C)   TempSrc: Oral Oral Oral   SpO2: 94% 90% 96% 93%  Weight: (!) 143.1 kg     Height: 6\' 3"  (1.905 m)       General: Pt is alert, awake, not in acute distress Cardiovascular: IRRR, S1/S2 +, no rubs, no gallops Respiratory: Bilateral diminished breath sounds; no wheezing Abdominal: Soft, NT, ND, bowel sounds + Extremities: Trace bilateral lower extremity edema, no cyanosis   The results of significant diagnostics from this hospitalization (including imaging, microbiology, ancillary and laboratory) are listed below for reference.    Significant Diagnostic Studies: Ct Abdomen Pelvis W Contrast  Result Date: 02/14/2018 CLINICAL DATA:  Acute onset of periumbilical and bilateral lower quadrant abdominal pain. No recent bowel movements. EXAM: CT ABDOMEN AND PELVIS WITH CONTRAST TECHNIQUE: Multidetector CT imaging of the abdomen and pelvis was performed using the standard protocol following bolus administration of intravenous contrast. CONTRAST:  120mL ISOVUE-300 IOPAMIDOL (ISOVUE-300) INJECTION 61%  COMPARISON:  CT of the abdomen and pelvis performed 12/30/2017 FINDINGS: Lower chest: Minimal bibasilar atelectasis or scarring is noted. The visualized portions of the mediastinum are unremarkable. Hepatobiliary: The liver is unremarkable in appearance. Stones are suggested within the gallbladder. The  gallbladder is otherwise unremarkable. The common bile duct is normal in caliber. Pancreas: The pancreas is within normal limits. Spleen: A 1.2 cm cystic focus is noted at the anterior aspect of the spleen. Adrenals/Urinary Tract: The adrenal glands are unremarkable in appearance. Nonspecific perinephric stranding is noted bilaterally. There is no evidence of hydronephrosis. No renal or ureteral stones are identified. Stomach/Bowel: The stomach is unremarkable in appearance. There is herniation of 2 segments of small bowel into a right lower quadrant anterior abdominal hernia, with incarceration and associated free fluid. Underlying mesenteric edema is noted, raising concern for some degree of strangulation. There is no definite evidence for bowel obstruction at this time. The small bowel is otherwise unremarkable. The appendix is normal in caliber, without evidence of appendicitis. The colon is unremarkable in appearance. Vascular/Lymphatic: Scattered calcification is seen along the abdominal aorta and its branches. The abdominal aorta is otherwise grossly unremarkable. The inferior vena cava is grossly unremarkable. No retroperitoneal lymphadenopathy is seen. No pelvic sidewall lymphadenopathy is identified. Reproductive: The bladder is mildly distended and grossly unremarkable. The patient is status post prostatectomy. Other: No additional soft tissue abnormalities are seen. Musculoskeletal: No acute osseous abnormalities are identified. There is slight chronic loss of height at vertebral body L1. The visualized musculature is unremarkable in appearance. IMPRESSION: 1. Herniation of 2 segments of small bowel into a right lower quadrant anterior abdominal hernia, with incarceration and associated free fluid. Underlying mesenteric edema noted, raising concern for some degree of strangulation. No definite evidence for bowel obstruction at this time. 2. Cholelithiasis. Gallbladder otherwise unremarkable. 3. 1.2 cm  cystic focus at the anterior aspect of the spleen, relatively stable from 2017 and likely benign. Aortic Atherosclerosis (ICD10-I70.0). These results were called by telephone at the time of interpretation on 02/14/2018 at 5:26 am to Dr. Joseph Berkshire, who verbally acknowledged these results. Electronically Signed   By: Garald Balding M.D.   On: 02/14/2018 05:28     Microbiology: No results found for this or any previous visit (from the past 240 hour(s)).   Labs: Basic Metabolic Panel: Recent Labs  Lab 02/14/18 0346 02/15/18 0613  NA 133* 134*  K 4.2 4.4  CL 97* 99  CO2 29 27  GLUCOSE 153* 132*  BUN 30* 19  CREATININE 0.92 0.87  CALCIUM 8.3* 8.4*   Liver Function Tests: Recent Labs  Lab 02/14/18 0346 02/15/18 0613  AST 23 23  ALT 26 28  ALKPHOS 118 108  BILITOT 0.4 0.6  PROT 6.2* 6.5  ALBUMIN 3.6 3.7   Recent Labs  Lab 02/14/18 0346  LIPASE 29   No results for input(s): AMMONIA in the last 168 hours. CBC: Recent Labs  Lab 02/14/18 0346 02/15/18 0613  WBC 8.7 9.1  NEUTROABS 5.3  --   HGB 11.4* 11.6*  HCT 37.8* 38.4*  MCV 83.4 84.2  PLT 153 155   Cardiac Enzymes: No results for input(s): CKTOTAL, CKMB, CKMBINDEX, TROPONINI in the last 168 hours. BNP: Invalid input(s): POCBNP CBG: Recent Labs  Lab 02/14/18 1735 02/14/18 2110 02/15/18 0417 02/15/18 0732 02/15/18 1136  GLUCAP 93 123* 109* 198* 94    Time coordinating discharge:  36 minutes  Signed:  Orson Eva, DO [facility] Pager: [phone] 02/15/2018, 12:52 PM

## 2018-02-15 NOTE — Progress Notes (Signed)
Patient INR elevated this am.  Page am md. Awaiting orders. Daytime nurse aware.

## 2018-02-15 NOTE — Progress Notes (Addendum)
[facility] Progress Note     Subjective: Patient walking in halls. Wants to leave. Tolerated liquids without issues. Had BM but reported some bloody. Dr. Carles Collet has ordered GI. Has history of GIB with telangectasia noted on Flex Sig 12/2017. Patient reports this bleeding was more from constipation and not the same clots of blood.  He says his abdomen continues to hurt but denies any nausea/vomiting.  INR up to 4.5.   Keeps asking about surgery and if we are going to do any surgery.   Objective: Vital signs in last 24 hours: Temp:  [98.4 F (36.9 C)-98.6 F (37 C)] 98.4 F (36.9 C) (12/31 2111) Pulse Rate:  [83-112] 111 (01/01 0531) Resp:  [18-20] 18 (01/01 0531) BP: (122-129)/(75-79) 124/75 (01/01 0531) SpO2:  [90 %-96 %] 93 % (01/01 0531) Last BM Date: 02/14/18  Intake/Output from previous day: 12/31 0701 - 01/01 0700 In: 820 [P.O.:720; IV Piggyback:100] Out: 1900 [Urine:1900] Intake/Output this shift: No intake/output data recorded.  General appearance: alert, cooperative and no distress Resp: normal work breathing GI: large pannus, nondistended, hernia in the lower right abdomen reducible, mildly tender to palpation in the lower abdomen, no rebound or guarding  Lab Results:  Recent Labs    02/14/18 0346 02/15/18 0613  WBC 8.7 9.1  HGB 11.4* 11.6*  HCT 37.8* 38.4*  PLT 153 155   BMET Recent Labs    02/14/18 0346 02/15/18 0613  NA 133* 134*  K 4.2 4.4  CL 97* 99  CO2 29 27  GLUCOSE 153* 132*  BUN 30* 19  CREATININE 0.92 0.87  CALCIUM 8.3* 8.4*   PT/INR Recent Labs    02/14/18 0346 02/15/18 0613  LABPROT 38.4* 42.7*  INR 4.01* 4.59*    Assessment/Plan: Dale Franklin is a 58 yo with a known chronic hernia that is reducible. He is obese with a large pannus and has multiple medical issues including COPD, A flutter on coumadin. He has been seen by hernia specialists at [facility] and was told to lose weight and quit smoking.  He has  no obstruction clinically or seen on CT scan.  Diet adv to soft diet No surgical intervention indicated at this time Needs to follow up with [facility] for his Hernia repair, will continue to get SBO related to hernia if not  Can discharge home once tolerates soft diet / pending other issues with INR/ bleeding that Hospitalist are working up    LOS: 0 days    Dale Franklin 02/15/2018

## 2018-02-15 NOTE — Progress Notes (Signed)
Patient went to bathroom and attempted to have vowel movement.  Patient noticed that after he wiped there was bright red blood on the tissue when he wiped. Patient has same amount of tenderness/ discomfort as in beginning of shift.  Will continue to monitor patient, vitals are stable.

## 2018-02-15 NOTE — Plan of Care (Signed)
Patient signed out AMA. Patient educated on benefits and risks of signing out AMA. His iv was removed. He verbalized understanding and still wanted to leave AMA.

## 2018-03-07 DIAGNOSIS — G4733 Obstructive sleep apnea (adult) (pediatric): Secondary | ICD-10-CM | POA: Diagnosis not present

## 2018-03-07 DIAGNOSIS — M25512 Pain in left shoulder: Secondary | ICD-10-CM | POA: Diagnosis not present

## 2018-03-07 DIAGNOSIS — M545 Low back pain: Secondary | ICD-10-CM | POA: Diagnosis not present

## 2018-03-07 DIAGNOSIS — M79629 Pain in unspecified upper arm: Secondary | ICD-10-CM | POA: Diagnosis not present

## 2018-03-13 ENCOUNTER — Other Ambulatory Visit: Payer: Self-pay

## 2018-03-13 NOTE — Patient Outreach (Signed)
[facility]) Care Management  03/13/2018  Dale Franklin [birth year] [id number]   Medication Adherence call to Dale Franklin patient did not answer he is due on Lisinopril /HCTZ 10/12.5, mg,Metformin Er 500 mg and Glipizide 10 mg. [employer] said they fill and pill pack every 28 days and patient picks up every 15th of the month. Dale Franklin is showing past due under [facility].   [facility] Direct Dial [phone]  Fax [phone] [email or web address]

## 2018-03-15 ENCOUNTER — Other Ambulatory Visit (HOSPITAL_COMMUNITY): Payer: Self-pay | Admitting: Urology

## 2018-03-15 ENCOUNTER — Other Ambulatory Visit: Payer: Self-pay | Admitting: Urology

## 2018-03-15 DIAGNOSIS — C61 Malignant neoplasm of prostate: Secondary | ICD-10-CM

## 2018-03-15 DIAGNOSIS — R9721 Rising PSA following treatment for malignant neoplasm of prostate: Secondary | ICD-10-CM

## 2018-03-17 ENCOUNTER — Other Ambulatory Visit (HOSPITAL_COMMUNITY): Payer: Medicare Other

## 2018-03-17 DIAGNOSIS — M542 Cervicalgia: Secondary | ICD-10-CM | POA: Diagnosis not present

## 2018-03-17 DIAGNOSIS — M25512 Pain in left shoulder: Secondary | ICD-10-CM | POA: Diagnosis not present

## 2018-03-17 DIAGNOSIS — M130 Polyarthritis, unspecified: Secondary | ICD-10-CM | POA: Diagnosis not present

## 2018-03-20 ENCOUNTER — Encounter (HOSPITAL_COMMUNITY): Payer: Self-pay

## 2018-03-20 ENCOUNTER — Encounter (HOSPITAL_COMMUNITY)
Admission: RE | Admit: 2018-03-20 | Discharge: 2018-03-20 | Disposition: A | Discharge status: Home / Self Care | Payer: Medicare Other | Source: Ambulatory Visit | Attending: Urology | Admitting: Urology

## 2018-03-20 ENCOUNTER — Ambulatory Visit (HOSPITAL_COMMUNITY)
Admission: RE | Admit: 2018-03-20 | Discharge: 2018-03-20 | Disposition: A | Discharge status: Home / Self Care | Payer: Medicare Other | Source: Ambulatory Visit | Attending: Urology | Admitting: Urology

## 2018-03-20 DIAGNOSIS — R9721 Rising PSA following treatment for malignant neoplasm of prostate: Secondary | ICD-10-CM | POA: Diagnosis not present

## 2018-03-20 DIAGNOSIS — C61 Malignant neoplasm of prostate: Secondary | ICD-10-CM | POA: Insufficient documentation

## 2018-03-20 MED ORDER — TECHNETIUM TC 99M MEDRONATE IV KIT
20.0000 | PACK | Freq: Once | INTRAVENOUS | Status: AC | PRN
  Administered 2018-03-20: 20.4 via INTRAVENOUS

## 2018-03-20 MED ORDER — IOHEXOL 300 MG/ML  SOLN
100.0000 mL | Freq: Once | INTRAMUSCULAR | Status: AC | PRN
  Administered 2018-03-20: 100 mL via INTRAVENOUS

## 2018-03-20 MED ORDER — IOHEXOL 300 MG/ML  SOLN: 100.0000 mL | Freq: Once | INTRAMUSCULAR | Status: DC | PRN

## 2018-03-21 ENCOUNTER — Ambulatory Visit: Payer: Medicare Other | Admitting: Urology

## 2018-03-21 ENCOUNTER — Ambulatory Visit (HOSPITAL_COMMUNITY): Payer: Medicare Other

## 2018-03-21 DIAGNOSIS — C61 Malignant neoplasm of prostate: Secondary | ICD-10-CM | POA: Diagnosis not present

## 2018-03-21 DIAGNOSIS — R9721 Rising PSA following treatment for malignant neoplasm of prostate: Secondary | ICD-10-CM

## 2018-03-21 DIAGNOSIS — C7951 Secondary malignant neoplasm of bone: Secondary | ICD-10-CM

## 2018-03-22 ENCOUNTER — Encounter (HOSPITAL_COMMUNITY): Payer: Self-pay | Admitting: *Deleted

## 2018-03-22 NOTE — Progress Notes (Signed)
Oncology Navigator Note:  Patient was referred to our office from his urologist Dr. Lowella Dell.  I called patient today to introduce myself and provide information in how I will be involved with their care.  I provided information on their first visit and what to expect.  I made sure patient was aware of appointment time and directions to the cancer center.  My phone number was given so that he can call me with any questions or concerns.  He voices appreciation and understanding.

## 2018-03-27 DIAGNOSIS — G893 Neoplasm related pain (acute) (chronic): Secondary | ICD-10-CM | POA: Diagnosis not present

## 2018-03-27 DIAGNOSIS — C7951 Secondary malignant neoplasm of bone: Secondary | ICD-10-CM | POA: Diagnosis not present

## 2018-03-27 DIAGNOSIS — Z510 Encounter for antineoplastic radiation therapy: Secondary | ICD-10-CM | POA: Diagnosis not present

## 2018-03-28 ENCOUNTER — Inpatient Hospital Stay (HOSPITAL_COMMUNITY): Payer: Medicare Other | Attending: Internal Medicine | Admitting: Internal Medicine

## 2018-03-28 ENCOUNTER — Encounter (HOSPITAL_COMMUNITY): Payer: Self-pay | Admitting: Internal Medicine

## 2018-03-28 ENCOUNTER — Other Ambulatory Visit: Payer: Self-pay

## 2018-03-28 VITALS — BP 113/100 | HR 99 | Temp 98.1°F | Resp 16 | Ht 75.0 in | Wt 295.0 lb

## 2018-03-28 DIAGNOSIS — E119 Type 2 diabetes mellitus without complications: Secondary | ICD-10-CM | POA: Insufficient documentation

## 2018-03-28 DIAGNOSIS — Z79899 Other long term (current) drug therapy: Secondary | ICD-10-CM

## 2018-03-28 DIAGNOSIS — C7951 Secondary malignant neoplasm of bone: Secondary | ICD-10-CM | POA: Diagnosis not present

## 2018-03-28 DIAGNOSIS — M4856XA Collapsed vertebra, not elsewhere classified, lumbar region, initial encounter for fracture: Secondary | ICD-10-CM

## 2018-03-28 DIAGNOSIS — J449 Chronic obstructive pulmonary disease, unspecified: Secondary | ICD-10-CM | POA: Insufficient documentation

## 2018-03-28 DIAGNOSIS — F329 Major depressive disorder, single episode, unspecified: Secondary | ICD-10-CM | POA: Diagnosis not present

## 2018-03-28 DIAGNOSIS — M1990 Unspecified osteoarthritis, unspecified site: Secondary | ICD-10-CM | POA: Diagnosis not present

## 2018-03-28 DIAGNOSIS — I10 Essential (primary) hypertension: Secondary | ICD-10-CM | POA: Insufficient documentation

## 2018-03-28 DIAGNOSIS — C61 Malignant neoplasm of prostate: Secondary | ICD-10-CM | POA: Diagnosis present

## 2018-03-28 DIAGNOSIS — F17210 Nicotine dependence, cigarettes, uncomplicated: Secondary | ICD-10-CM | POA: Diagnosis not present

## 2018-03-28 MED ORDER — HYDROCODONE-ACETAMINOPHEN 5-325 MG PO TABS: 1.0000 | ORAL_TABLET | ORAL | 0 refills | Status: AC | PRN

## 2018-03-28 NOTE — Progress Notes (Signed)
Referring Physician:  Dr.  Dr. Diona Fanti   Diagnosis No diagnosis found.  Staging Cancer Staging No matching staging information was found for the patient.  Assessment and Plan:  1.  Metastatic prostate cancer.  58 year old Caucasian man who is a heavy smoker and has multiple comorbidities, including COPD, morbid obesity, type 2 diabetes, Afib, and chronic back pain from a work related injury under the care by a pain clinic for opioid management. 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 He has been seen by RT for palliative radiation.   Review of chart shows oncologic history as follows:    02/2016: Seen by Dr. Diona Fanti after being found to have a PSA of 20.4 by his PCP  04/06/16: Prostate biopsy showing 8/12 cores positive with disease up to Gleason 4+5 throughout several areas of the gland. He was also found to have Gleason 4+4 disease and 4+3 disease.  04/2016: bone scan showing no evidence of osseous metastatic disease  05/2016: started on androgen deprivation  07/2016: MRI pelvis with a borderline enlarged right common iliac lymph node that was stable from a study on 12/24/2015  10/25/16: Completed a course of EBRT to a dose of 7920 cGy/44 fractions  01/25/17: Last follow up in radiation oncology (seen by Dr. Quitman Livings) before being lost to our follow up. At that time he was not having any concerning symptoms.  11/15/17: upon continued follow up with urology (Dr. Diona Fanti) patient's PSA was found to rise to 5.1 ng/mL despite castrate levels of testosterone. He denied any systemic symptoms or bone pain at that time.  12/30/17: CTAP for rectal bleeding showing thickening in the wall of the rectum likely  representing proctitis. Lower abdominal wall ventral hernia. No evidence of osseous metastatic disease  03/20/18: bone scan showing multiple site of osseous metastatic disease, including the T7, T10 and L1 vertebral levels, the right posterior (approximately) 3rd rib, right glenoid, iliac and sacrum.   03/21/18: seen again by Dr. Diona Fanti and now his PSA was increased to 16. CTAP performed on the same day showing a compression fracture of the L1 vertebral body and possibly some compression at T10. There were some paraaortic and pelvic lymph nodes noted that did not clearly appear malignant.  PSA Trend (ng/mL) and Testosterone (pg/dL): 03/16/16: 20.4  11/02/16: < 0.1 (T=15) 03/08/17: < 0.1 (T=15 09/19/17: 5.1 (T=11) 02/28/18: 16.1  Pt is complaining of worsening pain.  He is scheduled to be seen by Dr. Harlen Labs tomorrow for CT simulation.  Pt denies any incontinence of bowel or bladder.    Pt uncomfortable in the room.  Long talk held with pt today regarding options of therapy .  Will discuss case with RT to determine when RT will begin.   I discussed pts with high-risk or high-volume, metastatic prostate cancer are recommended for ADT and antiandrogen targeted therapy or ADT and chemotherapy.  I discussed options of Zytiga with potential side effects of hypokalemia, hypertension and liver toxicity versus myelosuppression with Taxotere.   Based on results of the LATITUDE trial, pts with newly diagnosed metastatic prostate cancer showed increase in overall survival  with the addition of Zytiga and Prednisone.   (median survival 53.3 versus 36.5 months.   -A similar degree of benefit was seen in all secondary endpoints,  including time to pain progression, time to prostate-specific antigen (PSA) progression, time to symptomatic skeletal event, time to chemotherapy, and time to subsequent prostate cancer therapy, and this was reflected in patient-reported outcomes showing clinical benefit in terms of  symptoms and health-related quality of life.   I have discussed options of Xgeva 120 mg SQ every 4 weeks to prevent skeletal related events with prostate cancer.  Calcium and vitamin D recommended.  Pt will be set up for follow-up in 4 weeks pending RT discussion  2.  Bone mets.  Pt is scheduled to be seen by RT. Will discuss case with RT to determine when treatment will begin.    3.  Bone pain.  Will determine when pt was last seen by pain clinic.  Pt reports he is not currently taking anything for pain.  Pt is significant distress today in clinic.  RX Norco 5/325 mg po q 4 hours # 90 sent to pharmacy.   I have discussed with him pain should improve with RT.    4.  Hypertension.  BP is 113/100.  Elevation likely due to pain level.  Pt should follow-up with PCP for monitoring.    60 minutes spent with more than 50% spent in counseling and coordination of care and review of records.    HPI:  58 year old Caucasian man who is a heavy smoker and has multiple comorbidities, including COPD, morbid obesity, type 2 diabetes, Afib, and chronic back pain from a work related injury under the care by a pain clinic for opioid management. 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 He has been seen by RT for palliative radiation.   Review of chart shows oncologic history as follows:    02/2016: Seen by Dr. Diona Fanti after being found to have a PSA of 20.4 by his PCP  04/06/16: Prostate biopsy showing 8/12 cores positive with disease up to Gleason 4+5 throughout several areas of the gland. He was also found to have Gleason 4+4 disease and 4+3 disease.  04/2016: bone scan showing no evidence of osseous metastatic disease  05/2016:  started on androgen deprivation  07/2016: MRI pelvis with a borderline enlarged right common iliac lymph node that was stable from a study on 12/24/2015  10/25/16: Completed a course of EBRT to a dose of 7920 cGy/44 fractions  01/25/17: Last follow up in radiation oncology (seen by Dr. Quitman Livings) before being lost to our follow up. At that time he was not having any concerning symptoms.  11/15/17: upon continued follow up with urology (Dr. Diona Fanti) patient's PSA was found to rise to 5.1 ng/mL despite castrate levels of testosterone. He denied any systemic symptoms or bone pain at that time.  12/30/17: CTAP for rectal bleeding showing thickening in the wall of the rectum likely representing proctitis. Lower abdominal wall ventral hernia. No evidence of osseous metastatic disease  03/20/18: bone scan showing multiple site of osseous metastatic disease, including the T7, T10 and L1 vertebral levels, the right posterior (approximately) 3rd rib, right glenoid, iliac and sacrum.   03/21/18: seen again by Dr. Diona Fanti and now his PSA was increased to 16. CTAP performed on the same day showing a compression fracture of the L1 vertebral body and possibly some compression at T10. There were some paraaortic  and pelvic lymph nodes noted that did not clearly appear malignant.  PSA Trend (ng/mL) and Testosterone (pg/dL): 03/16/16: 20.4  11/02/16: < 0.1 (T=15) 03/08/17: < 0.1 (T=15 09/19/17: 5.1 (T=11) 02/28/18: 16.1  Pt is complaining of worsening pain.  He is scheduled to be seen by Dr. Harlen Labs tomorrow for CT simulation.  Pt denies any incontinence of bowel or bladder.    Problem List Patient Active Problem List   Diagnosis Date Noted  . Hematochezia [K92.1] 02/15/2018  . Incarcerated hernia of abdominal cavity [K45.0] 02/14/2018  . Anemia [D64.9] 02/14/2018  . Incisional hernia, without obstruction or gangrene [K43.2]   . Proctocolitis with rectal bleeding [K52.9, K62.5] 12/30/2017  . Chronic left  shoulder pain [M25.512, G89.29] 12/30/2017  . Acute GI bleeding [K92.2] 11/19/2017  . Diabetes mellitus type II, non insulin dependent ([place]) [E11.9] 11/19/2017  . Rectal bleeding [K62.5] 10/18/2017  . Obesity (BMI 30-39.9) [E66.9]   . Atrial fibrillation, chronic [I48.20]   . Tobacco abuse [Z72.0] 07/21/2017  . COPD (chronic obstructive pulmonary disease) ([place]) [J44.9]   . Depression [F32.9]   . Diabetes mellitus without complication ([place]) [X51.7]   . Hypertension [I10]   . Bladder outlet obstruction [N32.0] 06/01/2016  . Malignant neoplasm of prostate ([place]) [C61] 05/31/2016  . Prostate cancer ([place]) [C61] 05/11/2016    Past Medical History Past Medical History:  Diagnosis Date  . Arthritis   . Bowel obstruction ([place])   . COPD (chronic obstructive pulmonary disease) ([place])   . Depression   . Diabetes mellitus without complication ([place])   . Dysrhythmia    A flutter  . Hypertension   . Prostate cancer [facility])     Past Surgical History Past Surgical History:  Procedure Laterality Date  . COLONOSCOPY WITH PROPOFOL N/A 11/04/2017   Procedure: COLONOSCOPY WITH PROPOFOL;  Surgeon: Rogene Houston, MD;  [facility];  Service: Endoscopy;  Laterality: N/A;  2:25  . EXPLORATORY LAPAROTOMY     gastric bleed with subsequent dehiscence, reoperation and temporary mesh (in [place])   . FLEXIBLE SIGMOIDOSCOPY N/A 12/30/2017   Procedure: FLEXIBLE SIGMOIDOSCOPY;  Surgeon: Rogene Houston, MD;  Location: [facility];  Service: Endoscopy;  Laterality: N/A;  . GOLD SEED IMPLANT N/A 05/31/2016   Procedure: GOLD SEED IMPLANT;  Surgeon: Franchot Gallo, MD;  Location: [facility] OR[facility];  Service: Urology;  Laterality: N/A;  . HAND SURGERY Right   . HERNIA REPAIR     permenant mesh hernia repair after initial Ex lap   . POLYPECTOMY  11/04/2017   Procedure: POLYPECTOMY;  Surgeon: Rogene Houston, MD;  Location[facility];  Service: Endoscopy;;  cecal polyp (CSx1), transverse colon  (CSx2),recto-sigmoid(CSx1)  . TRANSURETHRAL RESECTION OF PROSTATE N/A 05/31/2016   Procedure: TRANSURETHRAL RESECTION OF THE PROSTATE (TURP);  Surgeon: Franchot Gallo, MD;  Location: [facility];  Service: Urology;  Laterality: N/A;  . WRIST SURGERY Right     Family History Family History  Problem Relation Age of Onset  . Diabetes Mother   . Cancer Father   . Cancer Paternal Uncle        prostate     Social History  reports that he has been smoking cigarettes. He has a 41.00 pack-year smoking history. He has never used smokeless tobacco. He reports previous alcohol use. He reports that he does not use drugs.  Medications  Current Outpatient Medications:  .  albuterol (PROVENTIL HFA;VENTOLIN HFA) 108 (90 Base) MCG/ACT inhaler, Inhale 1-2 puffs into the lungs every 6 (six)  hours as needed for wheezing or shortness of breath., Disp: , Rfl:  .  docusate sodium (COLACE) 100 MG capsule, Take 2 capsules (200 mg total) by mouth daily., Disp: 30 capsule, Rfl: 1 .  DULoxetine (CYMBALTA) 30 MG capsule, Take 90 mg by mouth daily. , Disp: , Rfl: 5 .  DULoxetine (CYMBALTA) 60 MG capsule, TAKE ONE CAPSULE BY MOUTH DAILY. (MORNING), Disp: , Rfl: 3 .  glipiZIDE (GLUCOTROL) 10 MG tablet, Take 10 mg by mouth 2 (two) times daily., Disp: , Rfl:  .  leuprolide (LUPRON) 11.25 MG injection, Inject 11.25 mg into the muscle every 4 (four) months. , Disp: , Rfl:  .  lisinopril-hydrochlorothiazide (PRINZIDE,ZESTORETIC) 10-12.5 MG tablet, Take 1 tablet by mouth daily. , Disp: , Rfl: 12 .  metFORMIN (GLUCOPHAGE-XR) 500 MG 24 hr tablet, Take 500 mg by mouth 2 (two) times daily. , Disp: , Rfl: 3 .  methylPREDNISolone (MEDROL DOSEPAK) 4 MG TBPK tablet, FOLLOW DIRECTIONS ON PACKAGE., Disp: , Rfl:  .  metoprolol succinate (TOPROL-XL) 25 MG 24 hr tablet, Take 25 mg by mouth 2 (two) times daily. , Disp: , Rfl: 3 .  traZODone (DESYREL) 50 MG tablet, Take 50 mg by mouth at bedtime., Disp: , Rfl:  .  warfarin (COUMADIN) 10 MG  tablet, Take 1 tablet (10 mg total) by mouth every evening. Hold until 01/04/2018, Disp: , Rfl:  .  warfarin (COUMADIN) 2.5 MG tablet, Take 1 tablet (2.5 mg total) by mouth at bedtime. Hold until 01/04/2018 (Patient taking differently: Take 2.5 mg by mouth 2 (two) times daily. Take 1 tab in the morning and 1 tab at bedtime), Disp: , Rfl:  .  furosemide (LASIX) 20 MG tablet, Take 20 mg by mouth daily., Disp: , Rfl: 0 .  HYDROcodone-acetaminophen (NORCO) 5-325 MG tablet, Take 1 tablet by mouth every 4 (four) hours as needed for moderate pain., Disp: 90 tablet, Rfl: 0 .  oxybutynin (DITROPAN) 5 MG tablet, Take 5 mg by mouth 3 (three) times daily. , Disp: , Rfl:  .  psyllium (METAMUCIL MULTIHEALTH FIBER) 58.6 % powder, Take 2 packets by mouth daily. (Patient not taking: Reported on 03/28/2018), Disp: 283 g, Rfl: 3 .  simvastatin (ZOCOR) 10 MG tablet, Take 10 mg by mouth at bedtime., Disp: , Rfl: 12  Allergies Patient has no known allergies.  Review of Systems Review of Systems - Oncology ROS negative other than bone pain.     Physical Exam  Vitals Wt Readings from Last 3 Encounters:  03/28/18 295 lb (133.8 kg)  02/14/18 (!) 315 lb 7.7 oz (143.1 kg)  12/30/17 (!) 304 lb 0.2 oz (137.9 kg)   Temp Readings from Last 3 Encounters:  03/28/18 98.1 F (36.7 C) (Oral)  02/14/18 98.4 F (36.9 C) (Oral)  01/01/18 97.7 F (36.5 C) (Oral)   BP Readings from Last 3 Encounters:  03/28/18 (!) 113/100  02/15/18 124/75  01/01/18 119/67   Pulse Readings from Last 3 Encounters:  03/28/18 99  02/15/18 (!) 111  01/01/18 68   Constitutional: Well-developed, well-nourished, seated in wheel chair complaining of pain.  Moderate distress due to pain.   HENT: Head: Normocephalic and atraumatic.  Mouth/Throat: No oropharyngeal exudate. Mucosa moist. Eyes: Pupils are equal, round, and reactive to light. Conjunctivae are normal. No scleral icterus.  Neck: Normal range of motion. Neck supple. No JVD  present.  Cardiovascular: Normal rate, regular rhythm and normal heart sounds.  Exam reveals no gallop and no friction rub.   No murmur  heard. Pulmonary/Chest: Effort normal and breath sounds normal. No respiratory distress. No wheezes.No rales.  Abdominal: Soft. Bowel sounds are normal. No distension. There is no tenderness. There is no guarding.  Musculoskeletal: No edema or tenderness.  Lymphadenopathy: No cervical,axillary or supraclavicular adenopathy.  Neurological: Alert and oriented to person, place, and time. No cranial nerve deficit.  Skin: Skin is warm and dry. No rash noted. No erythema. No pallor.  Psychiatric: Affect and judgment normal.   Labs No visits with results within 3 Day(s) from this visit.  Latest known visit with results is:  Admission on 02/14/2018, Discharged on 02/15/2018  Component Date Value Ref Range Status  . WBC 02/14/2018 8.7  4.0 - 10.5 K/uL Final  . RBC 02/14/2018 4.53  4.22 - 5.81 MIL/uL Final  . Hemoglobin 02/14/2018 11.4* 13.0 - 17.0 g/dL Final  . HCT 02/14/2018 37.8* 39.0 - 52.0 % Final  . MCV 02/14/2018 83.4  80.0 - 100.0 fL Final  . MCH 02/14/2018 25.2* 26.0 - 34.0 pg Final  . MCHC 02/14/2018 30.2  30.0 - 36.0 g/dL Final  . RDW 02/14/2018 15.5  11.5 - 15.5 % Final  . Platelets 02/14/2018 153  150 - 400 K/uL Final  . nRBC 02/14/2018 0.0  0.0 - 0.2 % Final  . Neutrophils Relative % 02/14/2018 61  % Final  . Neutro Abs 02/14/2018 5.3  1.7 - 7.7 K/uL Final  . Lymphocytes Relative 02/14/2018 28  % Final  . Lymphs Abs 02/14/2018 2.4  0.7 - 4.0 K/uL Final  . Monocytes Relative 02/14/2018 8  % Final  . Monocytes Absolute 02/14/2018 0.7  0.1 - 1.0 K/uL Final  . Eosinophils Relative 02/14/2018 3  % Final  . Eosinophils Absolute 02/14/2018 0.2  0.0 - 0.5 K/uL Final  . Basophils Relative 02/14/2018 0  % Final  . Basophils Absolute 02/14/2018 0.0  0.0 - 0.1 K/uL Final  . Immature Granulocytes 02/14/2018 0  % Final  . Abs Immature Granulocytes  02/14/2018 0.02  0.00 - 0.07 K/uL Final   Performed at [facility], [address], [place], [place] [address]  . Sodium 02/14/2018 133* 135 - 145 mmol/L Final  . Potassium 02/14/2018 4.2  3.5 - 5.1 mmol/L Final  . Chloride 02/14/2018 97* 98 - 111 mmol/L Final  . CO2 02/14/2018 29  22 - 32 mmol/L Final  . Glucose, Bld 02/14/2018 153* 70 - 99 mg/dL Final  . BUN 02/14/2018 30* 6 - 20 mg/dL Final  . Creatinine, Ser 02/14/2018 0.92  0.61 - 1.24 mg/dL Final  . Calcium 02/14/2018 8.3* 8.9 - 10.3 mg/dL Final  . Total Protein 02/14/2018 6.2* 6.5 - 8.1 g/dL Final  . Albumin 02/14/2018 3.6  3.5 - 5.0 g/dL Final  . AST 02/14/2018 23  15 - 41 U/L Final  . ALT 02/14/2018 26  0 - 44 U/L Final  . Alkaline Phosphatase 02/14/2018 118  38 - 126 U/L Final  . Total Bilirubin 02/14/2018 0.4  0.3 - 1.2 mg/dL Final  . GFR calc non Af Amer 02/14/2018 >60  >60 mL/min Final  . GFR calc Af Amer 02/14/2018 >60  >60 mL/min Final  . Anion gap 02/14/2018 7  5 - 15 Final   Performed at [facility], [address] [place], [place] [address]  . Lipase 02/14/2018 29  11 - 51 U/L Final   Performed at [facility], [address], [place], [place] [address]  . Color, Urine 02/14/2018 YELLOW  YELLOW Final  . APPearance 02/14/2018  CLEAR  CLEAR Final  . Specific Gravity, Urine 02/14/2018 1.015  1.005 - 1.030 Final  . pH 02/14/2018 5.0  5.0 - 8.0 Final  . Glucose, UA 02/14/2018 NEGATIVE  NEGATIVE mg/dL Final  . Hgb urine dipstick 02/14/2018 NEGATIVE  NEGATIVE Final  . Bilirubin Urine 02/14/2018 NEGATIVE  NEGATIVE Final  . Ketones, ur 02/14/2018 NEGATIVE  NEGATIVE mg/dL Final  . Protein, ur 02/14/2018 NEGATIVE  NEGATIVE mg/dL Final  . Nitrite 02/14/2018 NEGATIVE  NEGATIVE Final  . Leukocytes, UA 02/14/2018 NEGATIVE  NEGATIVE Final   Performed at [facility], [address], [place], [place] [address]  . Lactic Acid, Venous 02/14/2018 1.1  0.5 - 1.9 mmol/L Final   Performed at [facility], [address] [place],  [place] [address]  . Prothrombin Time 02/14/2018 38.4* 11.4 - 15.2 seconds Final  . INR 02/14/2018 4.01*  Final   Comment: RESULT REPEATED AND VERIFIED CRITICAL RESULT CALLED TO, READ BACK BY AND VERIFIED WITH: WILEY,EBONY BY HUFFINES,S ON 02/14/18 AT 0702. Performed at [facility], [address], [place], [place] [address]   . Hgb A1c MFr Bld 02/14/2018 6.0* 4.8 - 5.6 % Final   Comment: (NOTE) Pre diabetes:          5.7%-6.4% Diabetes:              >6.4% Glycemic control for   <7.0% adults with diabetes   . Mean Plasma Glucose 02/14/2018 125.5  mg/dL Final   Performed at [facility] [address], [place], [place] [address]  . Glucose-Capillary 02/14/2018 100* 70 - 99 mg/dL Final  . Glucose-Capillary 02/14/2018 80  70 - 99 mg/dL Final  . Glucose-Capillary 02/14/2018 69* 70 - 99 mg/dL Final  . Comment 1 02/14/2018 Notify RN   Final  . Comment 2 02/14/2018 Document in Chart   Final  . Comment 3 02/14/2018 Repeat Test   Final  . WBC 02/15/2018 9.1  4.0 - 10.5 K/uL Final  . RBC 02/15/2018 4.56  4.22 - 5.81 MIL/uL Final  . Hemoglobin 02/15/2018 11.6* 13.0 - 17.0 g/dL Final  . HCT 02/15/2018 38.4* 39.0 - 52.0 % Final  . MCV 02/15/2018 84.2  80.0 - 100.0 fL Final  . MCH 02/15/2018 25.4* 26.0 - 34.0 pg Final  . MCHC 02/15/2018 30.2  30.0 - 36.0 g/dL Final  . RDW 02/15/2018 15.6* 11.5 - 15.5 % Final  . Platelets 02/15/2018 155  150 - 400 K/uL Final  . nRBC 02/15/2018 0.0  0.0 - 0.2 % Final   Performed at [facility], [address], [place], [place] [address]  . Sodium 02/15/2018 134* 135 - 145 mmol/L Final  . Potassium 02/15/2018 4.4  3.5 - 5.1 mmol/L Final  . Chloride 02/15/2018 99  98 - 111 mmol/L Final  . CO2 02/15/2018 27  22 - 32 mmol/L Final  . Glucose, Bld 02/15/2018 132* 70 - 99 mg/dL Final  . BUN 02/15/2018 19  6 - 20 mg/dL Final  . Creatinine, Ser 02/15/2018 0.87  0.61 - 1.24 mg/dL Final  . Calcium 02/15/2018 8.4* 8.9 - 10.3 mg/dL Final  . Total Protein 02/15/2018 6.5   6.5 - 8.1 g/dL Final  . Albumin 02/15/2018 3.7  3.5 - 5.0 g/dL Final  . AST 02/15/2018 23  15 - 41 U/L Final  . ALT 02/15/2018 28  0 - 44 U/L Final  . Alkaline Phosphatase 02/15/2018 108  38 - 126 U/L Final  . Total Bilirubin 02/15/2018 0.6  0.3 -  1.2 mg/dL Final  . GFR calc non Af Amer 02/15/2018 >60  >60 mL/min Final  . GFR calc Af Amer 02/15/2018 >60  >60 mL/min Final  . Anion gap 02/15/2018 8  5 - 15 Final   Performed at [facility], [address] [place], [place] [address]  . Prothrombin Time 02/15/2018 42.7* 11.4 - 15.2 seconds Final  . INR 02/15/2018 4.59*  Final   Comment: REPEATED TO VERIFY CRITICAL RESULT CALLED TO, READ BACK BY AND VERIFIED WITH: MHOWERTON AT 0714 BY HFLYNT 02/15/18 Performed at [facility], [address], [place], [place] [address]   . Glucose-Capillary 02/14/2018 63* 70 - 99 mg/dL Final  . Comment 1 02/14/2018 Notify RN   Final  . Comment 2 02/14/2018 Document in Chart   Final  . Comment 3 02/14/2018 Repeat Test   Final  . Glucose-Capillary 02/14/2018 66* 70 - 99 mg/dL Final  . Comment 1 02/14/2018 Notify RN   Final  . Comment 2 02/14/2018 Document in Chart   Final  . Comment 3 02/14/2018 Repeat Test   Final  . Glucose-Capillary 02/14/2018 93  70 - 99 mg/dL Final  . Comment 1 02/14/2018 Notify RN   Final  . Comment 2 02/14/2018 Document in Chart   Final  . Glucose-Capillary 02/14/2018 123* 70 - 99 mg/dL Final  . Glucose-Capillary 02/15/2018 109* 70 - 99 mg/dL Final  . Glucose-Capillary 02/15/2018 198* 70 - 99 mg/dL Final  . Glucose-Capillary 02/15/2018 94  70 - 99 mg/dL Final     Pathology No orders of the defined types were placed in this encounter.      Zoila Shutter MD

## 2018-03-29 ENCOUNTER — Other Ambulatory Visit (HOSPITAL_COMMUNITY): Payer: Self-pay | Admitting: Internal Medicine

## 2018-03-29 DIAGNOSIS — C61 Malignant neoplasm of prostate: Secondary | ICD-10-CM

## 2018-03-29 DIAGNOSIS — C7951 Secondary malignant neoplasm of bone: Secondary | ICD-10-CM

## 2018-03-30 ENCOUNTER — Encounter (HOSPITAL_COMMUNITY): Payer: Self-pay | Admitting: *Deleted

## 2018-03-30 ENCOUNTER — Encounter (HOSPITAL_COMMUNITY): Payer: Self-pay | Admitting: Internal Medicine

## 2018-03-30 ENCOUNTER — Ambulatory Visit (HOSPITAL_COMMUNITY)
Admission: RE | Admit: 2018-03-30 | Discharge: 2018-03-30 | Disposition: A | Discharge status: Home / Self Care | Payer: Medicare Other | Source: Ambulatory Visit | Attending: Internal Medicine | Admitting: Internal Medicine

## 2018-03-30 DIAGNOSIS — C61 Malignant neoplasm of prostate: Secondary | ICD-10-CM | POA: Diagnosis not present

## 2018-03-30 DIAGNOSIS — C7951 Secondary malignant neoplasm of bone: Secondary | ICD-10-CM | POA: Insufficient documentation

## 2018-03-30 LAB — POCT I-STAT CREATININE: Creatinine, Ser: 1.1 mg/dL (ref 0.61–1.24)

## 2018-03-30 MED ORDER — GADOBUTROL 1 MMOL/ML IV SOLN
10.0000 mL | Freq: Once | INTRAVENOUS | Status: AC | PRN
  Administered 2018-03-30: 10 mL via INTRAVENOUS

## 2018-03-30 NOTE — Progress Notes (Signed)
Patient referred to [facility].  Patient scheduled to see Dr. Zada Finders on 03/31/2018 @ 12PM (11:30 arrival).  Patient is aware of appointment.

## 2018-03-30 NOTE — Progress Notes (Signed)
I spoke with patient and gave him the results of his MRI spine. I advised him of the urgent need to get in with a neurosurgeon for evaluation.  His appointment is with Dr. Zada Finders at [facility] on 2/14 @ 1130 am.  Patient verbalizes understanding.

## 2018-03-31 DIAGNOSIS — C7951 Secondary malignant neoplasm of bone: Secondary | ICD-10-CM | POA: Diagnosis not present

## 2018-04-03 ENCOUNTER — Telehealth (HOSPITAL_COMMUNITY): Payer: Self-pay | Admitting: Internal Medicine

## 2018-04-03 NOTE — Telephone Encounter (Signed)
Call from Dr. Harlen Labs regarding pt refusal to come for RT evaluation unless he is prescribed pain medications.  I discussed with Dr. Harlen Labs that when he was seen he was prescribed Norco to determine if symptoms improved but due to MRI findings symptoms unlikely to improve unless RT started.  Dr. Harlen Labs will contact pain center or neurology regarding pain contract.  He will also contact PT to begin pt on another agent but will notify the patient findings on MRI will need to be treated with RT and likely symptoms may improve.  He has been seen by neurosurgery with no intervention reportedly recommended.  Pt will be seen at [facility] to assess improvement in symptoms once RT completed.

## 2018-04-04 ENCOUNTER — Encounter (HOSPITAL_COMMUNITY): Payer: Self-pay | Admitting: Internal Medicine

## 2018-04-04 ENCOUNTER — Encounter (HOSPITAL_COMMUNITY): Payer: Self-pay | Admitting: *Deleted

## 2018-04-04 NOTE — Progress Notes (Signed)
I spoke with [place], RN at [facility] and she states that patient is coming for appt there tomorrow at 8 am. They are going to get the patient to sign a pain contract that our office and the office of Dr. Merlene Laughter will no longer be prescribing him pain medication and they will be giving him medication for only 1 week at a time.  After his treatments at [facility] are complete they will no longer be providing him with any pain medication.  [place] , RN will update me with patient's completion of treatment date once that time is closer.

## 2018-04-04 NOTE — Progress Notes (Signed)
Spoke with Dr. Harlen Labs of [facility].  Pt continues to refuse RT evaluation unless pain meds provided.  Dr. Harlen Labs is aware pt was getting pain meds from Dr. Georgiann Mohs of Neurology.  Pt was also prescribed Norco the day of my visit.  Dr. Harlen Labs will attempt to contact pt again about risks if he continues to refuse treatment.  I will speak with manager here regarding information To determine next steps.

## 2018-04-05 DIAGNOSIS — Z510 Encounter for antineoplastic radiation therapy: Secondary | ICD-10-CM | POA: Diagnosis not present

## 2018-04-05 DIAGNOSIS — G893 Neoplasm related pain (acute) (chronic): Secondary | ICD-10-CM | POA: Diagnosis not present

## 2018-04-05 DIAGNOSIS — C7951 Secondary malignant neoplasm of bone: Secondary | ICD-10-CM | POA: Diagnosis not present

## 2018-04-06 ENCOUNTER — Encounter (HOSPITAL_COMMUNITY): Payer: Self-pay | Admitting: *Deleted

## 2018-04-06 DIAGNOSIS — Z510 Encounter for antineoplastic radiation therapy: Secondary | ICD-10-CM | POA: Diagnosis not present

## 2018-04-06 DIAGNOSIS — G893 Neoplasm related pain (acute) (chronic): Secondary | ICD-10-CM | POA: Diagnosis not present

## 2018-04-06 DIAGNOSIS — C7951 Secondary malignant neoplasm of bone: Secondary | ICD-10-CM | POA: Diagnosis not present

## 2018-04-07 DIAGNOSIS — G893 Neoplasm related pain (acute) (chronic): Secondary | ICD-10-CM | POA: Diagnosis not present

## 2018-04-07 DIAGNOSIS — C7951 Secondary malignant neoplasm of bone: Secondary | ICD-10-CM | POA: Diagnosis not present

## 2018-04-07 DIAGNOSIS — Z510 Encounter for antineoplastic radiation therapy: Secondary | ICD-10-CM | POA: Diagnosis not present

## 2018-04-07 NOTE — Progress Notes (Signed)
Oncology navigator note: Dr. Freddie Apley office called clinic asking about patient's pain medication refills. I informed them that patient was now under pain contract with Dr. Harlen Labs in [place] at [facility] and that they will be managing his pain until he is done with radiation.    I called [facility] and spoke with [place], RN that states patient did sign his contract and has had simulation completed. Patient will begin treatment on Monday 2/24.  She will keep me updated on patient's status and compliance.

## 2018-04-10 DIAGNOSIS — G893 Neoplasm related pain (acute) (chronic): Secondary | ICD-10-CM | POA: Diagnosis not present

## 2018-04-10 DIAGNOSIS — C7951 Secondary malignant neoplasm of bone: Secondary | ICD-10-CM | POA: Diagnosis not present

## 2018-04-10 DIAGNOSIS — Z510 Encounter for antineoplastic radiation therapy: Secondary | ICD-10-CM | POA: Diagnosis not present

## 2018-04-11 DIAGNOSIS — C7951 Secondary malignant neoplasm of bone: Secondary | ICD-10-CM | POA: Diagnosis not present

## 2018-04-11 DIAGNOSIS — Z510 Encounter for antineoplastic radiation therapy: Secondary | ICD-10-CM | POA: Diagnosis not present

## 2018-04-11 DIAGNOSIS — G893 Neoplasm related pain (acute) (chronic): Secondary | ICD-10-CM | POA: Diagnosis not present

## 2018-04-12 DIAGNOSIS — Z510 Encounter for antineoplastic radiation therapy: Secondary | ICD-10-CM | POA: Diagnosis not present

## 2018-04-12 DIAGNOSIS — G893 Neoplasm related pain (acute) (chronic): Secondary | ICD-10-CM | POA: Diagnosis not present

## 2018-04-12 DIAGNOSIS — C7951 Secondary malignant neoplasm of bone: Secondary | ICD-10-CM | POA: Diagnosis not present

## 2018-04-13 DIAGNOSIS — Z510 Encounter for antineoplastic radiation therapy: Secondary | ICD-10-CM | POA: Diagnosis not present

## 2018-04-13 DIAGNOSIS — C7951 Secondary malignant neoplasm of bone: Secondary | ICD-10-CM | POA: Diagnosis not present

## 2018-04-13 DIAGNOSIS — G893 Neoplasm related pain (acute) (chronic): Secondary | ICD-10-CM | POA: Diagnosis not present

## 2018-04-17 DIAGNOSIS — G893 Neoplasm related pain (acute) (chronic): Secondary | ICD-10-CM | POA: Diagnosis not present

## 2018-04-17 DIAGNOSIS — C7951 Secondary malignant neoplasm of bone: Secondary | ICD-10-CM | POA: Diagnosis not present

## 2018-04-17 DIAGNOSIS — Z510 Encounter for antineoplastic radiation therapy: Secondary | ICD-10-CM | POA: Diagnosis not present

## 2018-04-18 DIAGNOSIS — G893 Neoplasm related pain (acute) (chronic): Secondary | ICD-10-CM | POA: Diagnosis not present

## 2018-04-18 DIAGNOSIS — C7951 Secondary malignant neoplasm of bone: Secondary | ICD-10-CM | POA: Diagnosis not present

## 2018-04-18 DIAGNOSIS — Z510 Encounter for antineoplastic radiation therapy: Secondary | ICD-10-CM | POA: Diagnosis not present

## 2018-04-24 DIAGNOSIS — C7951 Secondary malignant neoplasm of bone: Secondary | ICD-10-CM | POA: Diagnosis not present

## 2018-04-24 DIAGNOSIS — Z510 Encounter for antineoplastic radiation therapy: Secondary | ICD-10-CM | POA: Diagnosis not present

## 2018-04-24 DIAGNOSIS — G893 Neoplasm related pain (acute) (chronic): Secondary | ICD-10-CM | POA: Diagnosis not present

## 2018-04-25 ENCOUNTER — Other Ambulatory Visit (HOSPITAL_COMMUNITY): Payer: Self-pay

## 2018-04-25 DIAGNOSIS — C7951 Secondary malignant neoplasm of bone: Secondary | ICD-10-CM | POA: Diagnosis not present

## 2018-04-25 DIAGNOSIS — Z510 Encounter for antineoplastic radiation therapy: Secondary | ICD-10-CM | POA: Diagnosis not present

## 2018-04-25 DIAGNOSIS — G893 Neoplasm related pain (acute) (chronic): Secondary | ICD-10-CM | POA: Diagnosis not present

## 2018-04-25 DIAGNOSIS — C61 Malignant neoplasm of prostate: Secondary | ICD-10-CM

## 2018-04-26 DIAGNOSIS — G893 Neoplasm related pain (acute) (chronic): Secondary | ICD-10-CM | POA: Diagnosis not present

## 2018-04-26 DIAGNOSIS — C7951 Secondary malignant neoplasm of bone: Secondary | ICD-10-CM | POA: Diagnosis not present

## 2018-04-26 DIAGNOSIS — Z510 Encounter for antineoplastic radiation therapy: Secondary | ICD-10-CM | POA: Diagnosis not present

## 2018-04-27 ENCOUNTER — Other Ambulatory Visit (HOSPITAL_COMMUNITY): Payer: Medicare Other

## 2018-04-27 ENCOUNTER — Ambulatory Visit (HOSPITAL_COMMUNITY): Payer: Medicare Other | Admitting: Hematology

## 2018-04-27 DIAGNOSIS — Z510 Encounter for antineoplastic radiation therapy: Secondary | ICD-10-CM | POA: Diagnosis not present

## 2018-04-27 DIAGNOSIS — G893 Neoplasm related pain (acute) (chronic): Secondary | ICD-10-CM | POA: Diagnosis not present

## 2018-04-27 DIAGNOSIS — C7951 Secondary malignant neoplasm of bone: Secondary | ICD-10-CM | POA: Diagnosis not present

## 2018-05-01 DIAGNOSIS — M47812 Spondylosis without myelopathy or radiculopathy, cervical region: Secondary | ICD-10-CM | POA: Diagnosis not present

## 2018-05-01 DIAGNOSIS — G893 Neoplasm related pain (acute) (chronic): Secondary | ICD-10-CM | POA: Diagnosis not present

## 2018-05-01 DIAGNOSIS — M5021 Other cervical disc displacement,  high cervical region: Secondary | ICD-10-CM | POA: Diagnosis not present

## 2018-05-01 DIAGNOSIS — M4802 Spinal stenosis, cervical region: Secondary | ICD-10-CM | POA: Diagnosis not present

## 2018-05-02 ENCOUNTER — Inpatient Hospital Stay (HOSPITAL_COMMUNITY): Payer: Medicare Other | Attending: Internal Medicine

## 2018-05-02 ENCOUNTER — Encounter (HOSPITAL_COMMUNITY): Payer: Self-pay | Admitting: *Deleted

## 2018-05-02 ENCOUNTER — Other Ambulatory Visit: Payer: Self-pay

## 2018-05-02 ENCOUNTER — Inpatient Hospital Stay (HOSPITAL_COMMUNITY): Payer: Medicare Other | Admitting: Hematology

## 2018-05-02 DIAGNOSIS — C7951 Secondary malignant neoplasm of bone: Secondary | ICD-10-CM | POA: Diagnosis not present

## 2018-05-02 DIAGNOSIS — C61 Malignant neoplasm of prostate: Secondary | ICD-10-CM | POA: Diagnosis not present

## 2018-05-02 DIAGNOSIS — F17210 Nicotine dependence, cigarettes, uncomplicated: Secondary | ICD-10-CM | POA: Insufficient documentation

## 2018-05-02 DIAGNOSIS — Z79899 Other long term (current) drug therapy: Secondary | ICD-10-CM | POA: Diagnosis not present

## 2018-05-02 LAB — CBC WITH DIFFERENTIAL/PLATELET
Abs Immature Granulocytes: 0.11 10*3/uL — ABNORMAL HIGH (ref 0.00–0.07)
BASOS ABS: 0.1 10*3/uL (ref 0.0–0.1)
Basophils Relative: 1 %
Eosinophils Absolute: 0.3 10*3/uL (ref 0.0–0.5)
Eosinophils Relative: 2 %
HCT: 50.1 % (ref 39.0–52.0)
Hemoglobin: 15.5 g/dL (ref 13.0–17.0)
Immature Granulocytes: 1 %
LYMPHS ABS: 1.2 10*3/uL (ref 0.7–4.0)
Lymphocytes Relative: 11 %
MCH: 25.2 pg — ABNORMAL LOW (ref 26.0–34.0)
MCHC: 30.9 g/dL (ref 30.0–36.0)
MCV: 81.5 fL (ref 80.0–100.0)
Monocytes Absolute: 0.7 10*3/uL (ref 0.1–1.0)
Monocytes Relative: 6 %
NRBC: 0 % (ref 0.0–0.2)
Neutro Abs: 8.7 10*3/uL — ABNORMAL HIGH (ref 1.7–7.7)
Neutrophils Relative %: 79 %
Platelets: 193 10*3/uL (ref 150–400)
RBC: 6.15 MIL/uL — ABNORMAL HIGH (ref 4.22–5.81)
RDW: 20.9 % — ABNORMAL HIGH (ref 11.5–15.5)
WBC: 11 10*3/uL — AB (ref 4.0–10.5)

## 2018-05-02 LAB — COMPREHENSIVE METABOLIC PANEL
ALT: 27 U/L (ref 0–44)
AST: 21 U/L (ref 15–41)
Albumin: 3.7 g/dL (ref 3.5–5.0)
Alkaline Phosphatase: 126 U/L (ref 38–126)
Anion gap: 10 (ref 5–15)
BILIRUBIN TOTAL: 0.5 mg/dL (ref 0.3–1.2)
BUN: 29 mg/dL — ABNORMAL HIGH (ref 6–20)
CO2: 27 mmol/L (ref 22–32)
Calcium: 9.1 mg/dL (ref 8.9–10.3)
Chloride: 95 mmol/L — ABNORMAL LOW (ref 98–111)
Creatinine, Ser: 1.1 mg/dL (ref 0.61–1.24)
GFR calc Af Amer: >60 mL/min (ref >60)
GFR calc non Af Amer: >60 mL/min (ref >60)
GLUCOSE: 253 mg/dL — AB (ref 70–99)
Potassium: 4.6 mmol/L (ref 3.5–5.1)
Sodium: 132 mmol/L — ABNORMAL LOW (ref 135–145)
Total Protein: 7.1 g/dL (ref 6.5–8.1)

## 2018-05-02 NOTE — Progress Notes (Signed)
1100- I saw patient coming down the hallway throwing his hands in the air, visibly agitated. I approached him and he stated, "I am leaving if I have to wait any longer. This is ridiculous." I asked patient to return to room 412 while I went to speak to Dr. Delton Coombes. Dr. Delton Coombes states that he agreed to see the patient even after he was 45 minutes late for his appointment. He also states that he will see the patient after he is done seeing the patients who came for their appointments on time. Dr. Delton Coombes states that the patient can continue to wait or that he can reschedule if he wishes. I returned to patients room and explained to him that he was 45 minutes late for his appointment and Dr. Delton Coombes still agreed to see him. I told him that he would be seen after the patients who arrived on time for their appointments, so he could continue to wait to be seen or he could reschedule his appointment. Dale Franklin became very angry, proceeded to exit the room, and started yelling profanity about doctors while walking down the hall. At this time he left the clinic. Dr. Delton Coombes made aware.

## 2018-05-04 ENCOUNTER — Other Ambulatory Visit: Payer: Self-pay

## 2018-05-04 NOTE — Patient Outreach (Signed)
[facility]) Care Management  05/04/2018  Edouard Gikas [birth year] [id number]   Medication Adherence call to [name] patient did not anwser he is due on all his medication. [place] said they fill all his medication and pill pack every 28 days . Mr. Hyams is showing past due under [place].   [place] Management Direct Dial [phone]  Fax [phone] [email or web address]

## 2018-05-05 DIAGNOSIS — R0602 Shortness of breath: Secondary | ICD-10-CM | POA: Diagnosis not present

## 2018-05-05 DIAGNOSIS — Z7984 Long term (current) use of oral hypoglycemic drugs: Secondary | ICD-10-CM | POA: Diagnosis not present

## 2018-05-05 DIAGNOSIS — Z79899 Other long term (current) drug therapy: Secondary | ICD-10-CM | POA: Diagnosis not present

## 2018-05-05 DIAGNOSIS — G8929 Other chronic pain: Secondary | ICD-10-CM | POA: Diagnosis not present

## 2018-05-05 DIAGNOSIS — I10 Essential (primary) hypertension: Secondary | ICD-10-CM | POA: Diagnosis not present

## 2018-05-05 DIAGNOSIS — M5489 Other dorsalgia: Secondary | ICD-10-CM | POA: Diagnosis not present

## 2018-05-05 DIAGNOSIS — Z7901 Long term (current) use of anticoagulants: Secondary | ICD-10-CM | POA: Diagnosis not present

## 2018-05-05 DIAGNOSIS — G893 Neoplasm related pain (acute) (chronic): Secondary | ICD-10-CM | POA: Diagnosis not present

## 2018-05-05 DIAGNOSIS — R52 Pain, unspecified: Secondary | ICD-10-CM | POA: Diagnosis not present

## 2018-05-05 DIAGNOSIS — R42 Dizziness and giddiness: Secondary | ICD-10-CM | POA: Diagnosis not present

## 2018-05-05 DIAGNOSIS — E7800 Pure hypercholesterolemia, unspecified: Secondary | ICD-10-CM | POA: Diagnosis not present

## 2018-05-05 DIAGNOSIS — I4891 Unspecified atrial fibrillation: Secondary | ICD-10-CM | POA: Diagnosis not present

## 2018-05-05 DIAGNOSIS — E119 Type 2 diabetes mellitus without complications: Secondary | ICD-10-CM | POA: Diagnosis not present

## 2018-05-05 DIAGNOSIS — M545 Low back pain: Secondary | ICD-10-CM | POA: Diagnosis not present

## 2018-05-09 DIAGNOSIS — R0902 Hypoxemia: Secondary | ICD-10-CM | POA: Diagnosis not present

## 2018-05-09 DIAGNOSIS — R52 Pain, unspecified: Secondary | ICD-10-CM | POA: Diagnosis not present

## 2018-05-09 DIAGNOSIS — R531 Weakness: Secondary | ICD-10-CM | POA: Diagnosis not present

## 2018-05-11 DIAGNOSIS — Z5181 Encounter for therapeutic drug level monitoring: Secondary | ICD-10-CM | POA: Diagnosis not present

## 2018-05-23 ENCOUNTER — Ambulatory Visit (INDEPENDENT_AMBULATORY_CARE_PROVIDER_SITE_OTHER): Payer: Medicare Other | Admitting: Urology

## 2018-05-23 DIAGNOSIS — C7951 Secondary malignant neoplasm of bone: Secondary | ICD-10-CM

## 2018-05-23 DIAGNOSIS — C61 Malignant neoplasm of prostate: Secondary | ICD-10-CM | POA: Diagnosis not present

## 2018-05-23 DIAGNOSIS — Z192 Hormone resistant malignancy status: Secondary | ICD-10-CM

## 2018-05-23 DIAGNOSIS — R9721 Rising PSA following treatment for malignant neoplasm of prostate: Secondary | ICD-10-CM | POA: Diagnosis not present

## 2018-05-23 DIAGNOSIS — Z79899 Other long term (current) drug therapy: Secondary | ICD-10-CM | POA: Diagnosis not present

## 2018-05-24 DIAGNOSIS — G893 Neoplasm related pain (acute) (chronic): Secondary | ICD-10-CM | POA: Diagnosis not present

## 2018-05-24 DIAGNOSIS — Z5181 Encounter for therapeutic drug level monitoring: Secondary | ICD-10-CM | POA: Diagnosis not present

## 2018-05-31 DIAGNOSIS — Z5112 Encounter for antineoplastic immunotherapy: Secondary | ICD-10-CM | POA: Diagnosis not present

## 2018-06-05 DIAGNOSIS — M542 Cervicalgia: Secondary | ICD-10-CM | POA: Diagnosis not present

## 2018-06-05 DIAGNOSIS — M130 Polyarthritis, unspecified: Secondary | ICD-10-CM | POA: Diagnosis not present

## 2018-06-05 DIAGNOSIS — M79629 Pain in unspecified upper arm: Secondary | ICD-10-CM | POA: Diagnosis not present

## 2018-06-05 DIAGNOSIS — M25512 Pain in left shoulder: Secondary | ICD-10-CM | POA: Diagnosis not present

## 2018-06-05 DIAGNOSIS — G4733 Obstructive sleep apnea (adult) (pediatric): Secondary | ICD-10-CM | POA: Diagnosis not present

## 2018-06-05 DIAGNOSIS — M545 Low back pain: Secondary | ICD-10-CM | POA: Diagnosis not present

## 2018-06-06 DIAGNOSIS — I482 Chronic atrial fibrillation, unspecified: Secondary | ICD-10-CM | POA: Diagnosis not present

## 2018-06-22 DIAGNOSIS — D649 Anemia, unspecified: Secondary | ICD-10-CM | POA: Diagnosis not present

## 2018-06-22 DIAGNOSIS — G893 Neoplasm related pain (acute) (chronic): Secondary | ICD-10-CM | POA: Diagnosis not present

## 2018-06-22 DIAGNOSIS — R569 Unspecified convulsions: Secondary | ICD-10-CM | POA: Diagnosis not present

## 2018-06-29 DIAGNOSIS — M545 Low back pain: Secondary | ICD-10-CM | POA: Diagnosis not present

## 2018-06-29 DIAGNOSIS — G4733 Obstructive sleep apnea (adult) (pediatric): Secondary | ICD-10-CM | POA: Diagnosis not present

## 2018-06-29 DIAGNOSIS — M79629 Pain in unspecified upper arm: Secondary | ICD-10-CM | POA: Diagnosis not present

## 2018-06-29 DIAGNOSIS — M25512 Pain in left shoulder: Secondary | ICD-10-CM | POA: Diagnosis not present

## 2018-06-29 DIAGNOSIS — M542 Cervicalgia: Secondary | ICD-10-CM | POA: Diagnosis not present

## 2018-07-18 ENCOUNTER — Ambulatory Visit (INDEPENDENT_AMBULATORY_CARE_PROVIDER_SITE_OTHER): Payer: Medicare Other | Admitting: Urology

## 2018-07-18 DIAGNOSIS — Z79899 Other long term (current) drug therapy: Secondary | ICD-10-CM | POA: Diagnosis not present

## 2018-07-18 DIAGNOSIS — C7951 Secondary malignant neoplasm of bone: Secondary | ICD-10-CM | POA: Diagnosis not present

## 2018-07-18 DIAGNOSIS — C61 Malignant neoplasm of prostate: Secondary | ICD-10-CM | POA: Diagnosis not present

## 2018-07-21 ENCOUNTER — Ambulatory Visit (INDEPENDENT_AMBULATORY_CARE_PROVIDER_SITE_OTHER): Payer: Medicare Other | Admitting: Urology

## 2018-07-21 DIAGNOSIS — C61 Malignant neoplasm of prostate: Secondary | ICD-10-CM | POA: Diagnosis not present

## 2018-07-27 DIAGNOSIS — K439 Ventral hernia without obstruction or gangrene: Secondary | ICD-10-CM | POA: Diagnosis not present

## 2018-07-27 DIAGNOSIS — R609 Edema, unspecified: Secondary | ICD-10-CM | POA: Diagnosis not present

## 2018-07-27 DIAGNOSIS — I10 Essential (primary) hypertension: Secondary | ICD-10-CM | POA: Diagnosis not present

## 2018-07-27 DIAGNOSIS — Z7901 Long term (current) use of anticoagulants: Secondary | ICD-10-CM | POA: Diagnosis not present

## 2018-07-27 DIAGNOSIS — M4802 Spinal stenosis, cervical region: Secondary | ICD-10-CM | POA: Diagnosis not present

## 2018-07-27 DIAGNOSIS — M47812 Spondylosis without myelopathy or radiculopathy, cervical region: Secondary | ICD-10-CM | POA: Diagnosis not present

## 2018-07-27 DIAGNOSIS — Z923 Personal history of irradiation: Secondary | ICD-10-CM | POA: Diagnosis not present

## 2018-07-27 DIAGNOSIS — Z192 Hormone resistant malignancy status: Secondary | ICD-10-CM | POA: Diagnosis not present

## 2018-07-27 DIAGNOSIS — I4891 Unspecified atrial fibrillation: Secondary | ICD-10-CM | POA: Diagnosis not present

## 2018-07-27 DIAGNOSIS — C7951 Secondary malignant neoplasm of bone: Secondary | ICD-10-CM | POA: Diagnosis not present

## 2018-07-27 DIAGNOSIS — G893 Neoplasm related pain (acute) (chronic): Secondary | ICD-10-CM | POA: Diagnosis not present

## 2018-07-27 DIAGNOSIS — J449 Chronic obstructive pulmonary disease, unspecified: Secondary | ICD-10-CM | POA: Diagnosis not present

## 2018-07-27 DIAGNOSIS — I4892 Unspecified atrial flutter: Secondary | ICD-10-CM | POA: Diagnosis not present

## 2018-07-27 DIAGNOSIS — E119 Type 2 diabetes mellitus without complications: Secondary | ICD-10-CM | POA: Diagnosis not present

## 2018-07-27 DIAGNOSIS — Z7984 Long term (current) use of oral hypoglycemic drugs: Secondary | ICD-10-CM | POA: Diagnosis not present

## 2018-07-27 DIAGNOSIS — Z79899 Other long term (current) drug therapy: Secondary | ICD-10-CM | POA: Diagnosis not present

## 2018-08-10 ENCOUNTER — Telehealth: Payer: Self-pay | Admitting: Oncology

## 2018-08-10 ENCOUNTER — Other Ambulatory Visit: Payer: Self-pay

## 2018-08-10 ENCOUNTER — Other Ambulatory Visit: Payer: Medicare Other

## 2018-08-10 DIAGNOSIS — Z20822 Contact with and (suspected) exposure to covid-19: Secondary | ICD-10-CM

## 2018-08-10 DIAGNOSIS — R6889 Other general symptoms and signs: Secondary | ICD-10-CM | POA: Diagnosis not present

## 2018-08-10 NOTE — Telephone Encounter (Signed)
Received a new patient referral from Dr. Diona Fanti for 2nd opinion for prostate cancer. Pt has been cld and scheduled to see Dr. Alen Blew on 7/2 at 11am. He's been made aware to arrive 20 minutes early. Locationto our facility has been provided to the pt.

## 2018-08-13 LAB — NOVEL CORONAVIRUS, NAA: SARS-CoV-2, NAA: NOT DETECTED

## 2018-08-17 ENCOUNTER — Inpatient Hospital Stay: Payer: Medicare Other | Attending: Oncology | Admitting: Oncology

## 2018-08-17 ENCOUNTER — Other Ambulatory Visit: Payer: Self-pay

## 2018-08-17 VITALS — BP 121/87 | HR 98 | Temp 97.7°F | Resp 18 | Ht 75.0 in | Wt 293.7 lb

## 2018-08-17 DIAGNOSIS — C7951 Secondary malignant neoplasm of bone: Secondary | ICD-10-CM | POA: Insufficient documentation

## 2018-08-17 DIAGNOSIS — C61 Malignant neoplasm of prostate: Secondary | ICD-10-CM | POA: Insufficient documentation

## 2018-08-17 DIAGNOSIS — Z79899 Other long term (current) drug therapy: Secondary | ICD-10-CM | POA: Diagnosis not present

## 2018-08-17 DIAGNOSIS — Z923 Personal history of irradiation: Secondary | ICD-10-CM

## 2018-08-17 NOTE — Progress Notes (Signed)
Hematology and Oncology Follow Up Visit  Dale Franklin [id number] 07-Mar-1960 58 y.o. 08/17/2018 10:46 AM Dale Mustard Aldean Jewett, DO   Principle Diagnosis: 58 year old man with castration-resistant prostate cancer with disease to the bone.  He was initially diagnosed in February 2018.  He presented with a Gleason score 4+5 = 9.  PSA was 20.4.   Prior Therapy:  He started androgen deprivation therapy in April 2018.  He is status post external beam radiation completing 7920 cGy in  44 fractions in September 2018.  He subsequently developed metastatic bone disease in February 2020 while receiving androgen deprivation.   He is status post palliative radiation therapy to the spine in February 2020.  Current therapy:  Androgen deprivation therapy in addition to Zytiga that was added in February 2020.  His PSA currently undetectable and follows up with Dr. Diona Fanti from urology standpoint and Dr. Federico Flake from an oncology standpoint In [place].   Interim History: Dale Franklin is seen today for a repeat evaluation.  He is a pleasant gentleman I saw in consultation when he was first diagnosed for localized prostate cancer.  He developed advanced disease and is currently castration hyper resistant with bony metastasis.  He is currently receiving Zytiga with PSA is undetectable as of June 2020.  He asked for a second opinion regarding treatment for his prostate cancer because of the side effects associated with androgen deprivation.  He feels of fatigue with overall poor quality of life because of lack of testosterone.  He feels predominantly the symptoms are related to androgen deprivation although Zytiga added some to his complaints.  He is questioning the role of systemic chemotherapy regarding his treatment.  His pain management appears to be adequate at this time  He does not report any headaches, blurry vision, syncope or seizures. Does not report any fevers, chills or sweats.  Does not  report any cough, wheezing or hemoptysis.  Does not report any chest pain, palpitation, orthopnea or leg edema.  Does not report any nausea, vomiting or abdominal pain.  Does not report any constipation or diarrhea.  Does not report any skeletal complaints.    Does not report frequency, urgency or hematuria.  Does not report any skin rashes or lesions. Does not report any heat or cold intolerance.  Does not report any lymphadenopathy or petechiae.  He does report anxiety and depression associated with diagnosis and symptoms.  Remaining review of systems is negative.    Medications: I have reviewed the patient's current medications.  Current Outpatient Medications  Medication Sig Dispense Refill  . albuterol (PROVENTIL HFA;VENTOLIN HFA) 108 (90 Base) MCG/ACT inhaler Inhale 1-2 puffs into the lungs every 6 (six) hours as needed for wheezing or shortness of breath.    . docusate sodium (COLACE) 100 MG capsule Take 2 capsules (200 mg total) by mouth daily. 30 capsule 1  . DULoxetine (CYMBALTA) 30 MG capsule Take 90 mg by mouth daily.   5  . DULoxetine (CYMBALTA) 60 MG capsule TAKE ONE CAPSULE BY MOUTH DAILY. (MORNING)  3  . furosemide (LASIX) 20 MG tablet Take 20 mg by mouth daily.  0  . glipiZIDE (GLUCOTROL) 10 MG tablet Take 10 mg by mouth 2 (two) times daily.    Marland Kitchen HYDROcodone-acetaminophen (NORCO) 5-325 MG tablet Take 1 tablet by mouth every 4 (four) hours as needed for moderate pain. 90 tablet 0  . leuprolide (LUPRON) 11.25 MG injection Inject 11.25 mg into the muscle every 4 (four) months.     Marland Kitchen  lisinopril-hydrochlorothiazide (PRINZIDE,ZESTORETIC) 10-12.5 MG tablet Take 1 tablet by mouth daily.   12  . metFORMIN (GLUCOPHAGE-XR) 500 MG 24 hr tablet Take 500 mg by mouth 2 (two) times daily.   3  . methylPREDNISolone (MEDROL DOSEPAK) 4 MG TBPK tablet FOLLOW DIRECTIONS ON PACKAGE.    . metoprolol succinate (TOPROL-XL) 25 MG 24 hr tablet Take 25 mg by mouth 2 (two) times daily.   3  . morphine (MSIR)  15 MG tablet TAKE EVERY 6 HOURS FOR PAIN - TAKE TWO (2) TABLETS BY MOUTH EVERY MORNING, 3 TABLETS FOR THE NEXT TWO DOSES THEN FOUR TABLETS AT BEDTIME.    Marland Kitchen oxybutynin (DITROPAN) 5 MG tablet Take 5 mg by mouth 3 (three) times daily.     . psyllium (METAMUCIL MULTIHEALTH FIBER) 58.6 % powder Take 2 packets by mouth daily. (Patient not taking: Reported on 03/28/2018) 283 g 3  . simvastatin (ZOCOR) 10 MG tablet Take 10 mg by mouth at bedtime.  12  . traZODone (DESYREL) 50 MG tablet Take 50 mg by mouth at bedtime.    Marland Kitchen warfarin (COUMADIN) 10 MG tablet Take 1 tablet (10 mg total) by mouth every evening. Hold until 01/04/2018    . warfarin (COUMADIN) 2.5 MG tablet Take 1 tablet (2.5 mg total) by mouth at bedtime. Hold until 01/04/2018 (Patient taking differently: Take 2.5 mg by mouth 2 (two) times daily. Take 1 tab in the morning and 1 tab at bedtime)     No current facility-administered medications for this visit.      Allergies: No Known Allergies  Past Medical History, Surgical history, Social history, and Family History were reviewed and updated.     Lab Results: Lab Results  Component Value Date   WBC 11.0 (H) 05/02/2018   HGB 15.5 05/02/2018   HCT 50.1 05/02/2018   MCV 81.5 05/02/2018   PLT 193 05/02/2018     Chemistry      Component Value Date/Time   NA 132 (L) 05/02/2018 0942   K 4.6 05/02/2018 0942   CL 95 (L) 05/02/2018 0942   CO2 27 05/02/2018 0942   BUN 29 (H) 05/02/2018 0942   CREATININE 1.10 05/02/2018 0942      Component Value Date/Time   CALCIUM 9.1 05/02/2018 0942   ALKPHOS 126 05/02/2018 0942   AST 21 05/02/2018 0942   ALT 27 05/02/2018 0942   BILITOT 0.5 05/02/2018 0942       Impression and Plan:   58 year old man with:  1.  Advanced prostate cancer that is currently castration-resistant with metastatic disease to the bone.  He is currently receiving androgen deprivation therapy with Zytiga with his last PSA undetectable on July 24, 2018.  The natural  course of this disease and treatment options were reviewed today in detail with the patient.  He understands he has disease that is incurable and any treatment is palliative at this time.  The backbone to treating his cancer continues to be androgen deprivation.  I do not recommend stopping androgen deprivation at this time and I do not believe replacing it with systemic chemotherapy is a good option.  The role of Zytiga was discussed feel that this is an excellent option given his excellent response at this time.  The role of systemic chemotherapy was discussed today but I explained to him would be an alternative to [facility] but not to androgen deprivation.  Systemic chemotherapy would not necessarily offer better quality of life with multitude of symptoms that were reiterated at this  time.  His include nausea, vomiting, neuropathy, edema, fatigue and potential serious complications such as sepsis and hospitalization.  He is frustrated with the symptoms associated with androgen deprivation and contemplating stopping cancer treatment.  I urged him not to do so given the fact that without cancer treatment his quality of life will suffer for a different reason.  His disease will likely metastasize more and cause more pain in his bones.  As a final conclusion of the discussion I urged him to continue to follow with his current oncologist as he is receiving the best potential care for his cancer.  2.  Prognosis and goals of care: This was reiterated again today.  His disease is incurable and any treatment is palliative to improve his overall quality of life.  All his questions are answered today to his satisfaction.  No follow-up is necessary at this time.  25  minutes was spent with the patient face-to-face today.  More than 50% of time was spent on reviewing his disease status, reviewing laboratory data, treatment options and complications related therapy.    Zola Button, MD 7/2/202010:46 AM

## 2018-08-21 ENCOUNTER — Telehealth: Payer: Self-pay | Admitting: Oncology

## 2018-08-21 DIAGNOSIS — E559 Vitamin D deficiency, unspecified: Secondary | ICD-10-CM | POA: Diagnosis not present

## 2018-08-21 NOTE — Telephone Encounter (Signed)
No 7/2 los

## 2018-08-24 DIAGNOSIS — M545 Low back pain: Secondary | ICD-10-CM | POA: Diagnosis not present

## 2018-08-24 DIAGNOSIS — M25512 Pain in left shoulder: Secondary | ICD-10-CM | POA: Diagnosis not present

## 2018-08-24 DIAGNOSIS — G4733 Obstructive sleep apnea (adult) (pediatric): Secondary | ICD-10-CM | POA: Diagnosis not present

## 2018-08-24 DIAGNOSIS — M79629 Pain in unspecified upper arm: Secondary | ICD-10-CM | POA: Diagnosis not present

## 2018-08-24 DIAGNOSIS — M542 Cervicalgia: Secondary | ICD-10-CM | POA: Diagnosis not present

## 2018-08-25 DIAGNOSIS — K029 Dental caries, unspecified: Secondary | ICD-10-CM | POA: Diagnosis not present

## 2018-08-25 DIAGNOSIS — R7989 Other specified abnormal findings of blood chemistry: Secondary | ICD-10-CM | POA: Diagnosis not present

## 2018-09-14 ENCOUNTER — Other Ambulatory Visit: Payer: Self-pay

## 2018-09-14 NOTE — Patient Outreach (Signed)
[facility]) Care Management  09/14/2018  Dale Franklin 12-May-1960 [id number]   Medication Adherence call to Dale Franklin Compliant Voice message left with a call back number. Dale Franklin is showing past due on Simvastatin 10 mg  under [place].   [facility] Management Direct Dial [phone]  Fax [phone] [email or web address]

## 2018-09-19 ENCOUNTER — Other Ambulatory Visit: Payer: Self-pay

## 2018-09-19 NOTE — Patient Outreach (Signed)
[facility]) Care Management  09/19/2018  Dale Franklin 03-01-1960 [id number]   Medication Adherence call to Dale Franklin Hippa Identifiers Verify spoke with patient he is past due on Simvastatin 10 mg,Glipizide 10 mg Lisinopril/Hctz 10/12.5 mg patient explain he is taking all three medication and receives every month a pill pack from the pharmacy.Dale Franklin os showing past due under Dale Franklin.   Dale Franklin Management Direct Dial [phone]  Fax [phone] [name]

## 2018-10-03 DIAGNOSIS — I482 Chronic atrial fibrillation, unspecified: Secondary | ICD-10-CM | POA: Diagnosis not present

## 2018-10-19 DIAGNOSIS — G4733 Obstructive sleep apnea (adult) (pediatric): Secondary | ICD-10-CM | POA: Diagnosis not present

## 2018-10-19 DIAGNOSIS — M25512 Pain in left shoulder: Secondary | ICD-10-CM | POA: Diagnosis not present

## 2018-10-19 DIAGNOSIS — M79641 Pain in right hand: Secondary | ICD-10-CM | POA: Diagnosis not present

## 2018-10-19 DIAGNOSIS — Z79891 Long term (current) use of opiate analgesic: Secondary | ICD-10-CM | POA: Diagnosis not present

## 2018-10-19 DIAGNOSIS — M79629 Pain in unspecified upper arm: Secondary | ICD-10-CM | POA: Diagnosis not present

## 2018-10-19 DIAGNOSIS — G894 Chronic pain syndrome: Secondary | ICD-10-CM | POA: Diagnosis not present

## 2018-10-27 DIAGNOSIS — Z09 Encounter for follow-up examination after completed treatment for conditions other than malignant neoplasm: Secondary | ICD-10-CM | POA: Diagnosis not present

## 2018-11-21 ENCOUNTER — Ambulatory Visit: Payer: Medicare Other | Admitting: Urology

## 2018-11-23 ENCOUNTER — Encounter (HOSPITAL_COMMUNITY): Payer: Self-pay

## 2018-11-23 ENCOUNTER — Other Ambulatory Visit: Payer: Self-pay

## 2018-11-23 ENCOUNTER — Emergency Department (HOSPITAL_COMMUNITY)
Admission: EM | Admit: 2018-11-23 | Discharge: 2018-11-23 | Disposition: A | Discharge status: Home / Self Care | Payer: Medicare Other | Attending: Emergency Medicine | Admitting: Emergency Medicine

## 2018-11-23 ENCOUNTER — Emergency Department (HOSPITAL_COMMUNITY): Payer: Medicare Other

## 2018-11-23 DIAGNOSIS — R072 Precordial pain: Secondary | ICD-10-CM | POA: Diagnosis not present

## 2018-11-23 DIAGNOSIS — Z79899 Other long term (current) drug therapy: Secondary | ICD-10-CM | POA: Diagnosis not present

## 2018-11-23 DIAGNOSIS — E119 Type 2 diabetes mellitus without complications: Secondary | ICD-10-CM | POA: Insufficient documentation

## 2018-11-23 DIAGNOSIS — C7951 Secondary malignant neoplasm of bone: Secondary | ICD-10-CM | POA: Diagnosis not present

## 2018-11-23 DIAGNOSIS — Z794 Long term (current) use of insulin: Secondary | ICD-10-CM | POA: Diagnosis not present

## 2018-11-23 DIAGNOSIS — I4891 Unspecified atrial fibrillation: Secondary | ICD-10-CM | POA: Diagnosis not present

## 2018-11-23 DIAGNOSIS — R079 Chest pain, unspecified: Secondary | ICD-10-CM

## 2018-11-23 DIAGNOSIS — C61 Malignant neoplasm of prostate: Secondary | ICD-10-CM | POA: Diagnosis not present

## 2018-11-23 DIAGNOSIS — J449 Chronic obstructive pulmonary disease, unspecified: Secondary | ICD-10-CM | POA: Diagnosis not present

## 2018-11-23 DIAGNOSIS — Z7901 Long term (current) use of anticoagulants: Secondary | ICD-10-CM | POA: Insufficient documentation

## 2018-11-23 DIAGNOSIS — F17210 Nicotine dependence, cigarettes, uncomplicated: Secondary | ICD-10-CM | POA: Diagnosis not present

## 2018-11-23 DIAGNOSIS — R0789 Other chest pain: Secondary | ICD-10-CM | POA: Insufficient documentation

## 2018-11-23 DIAGNOSIS — I10 Essential (primary) hypertension: Secondary | ICD-10-CM | POA: Insufficient documentation

## 2018-11-23 HISTORY — DX: Unspecified atrial fibrillation: I48.91

## 2018-11-23 LAB — BASIC METABOLIC PANEL
Anion gap: 10 (ref 5–15)
BUN: 25 mg/dL — ABNORMAL HIGH (ref 6–20)
CO2: 26 mmol/L (ref 22–32)
Calcium: 9.5 mg/dL (ref 8.9–10.3)
Chloride: 101 mmol/L (ref 98–111)
Creatinine, Ser: 0.94 mg/dL (ref 0.61–1.24)
GFR calc Af Amer: >60 mL/min (ref >60)
GFR calc non Af Amer: >60 mL/min (ref >60)
Glucose, Bld: 165 mg/dL — ABNORMAL HIGH (ref 70–99)
Potassium: 4.5 mmol/L (ref 3.5–5.1)
Sodium: 137 mmol/L (ref 135–145)

## 2018-11-23 LAB — CBC
HCT: 44.6 % (ref 39.0–52.0)
Hemoglobin: 13.8 g/dL (ref 13.0–17.0)
MCH: 27.2 pg (ref 26.0–34.0)
MCHC: 30.9 g/dL (ref 30.0–36.0)
MCV: 88 fL (ref 80.0–100.0)
Platelets: 193 10*3/uL (ref 150–400)
RBC: 5.07 MIL/uL (ref 4.22–5.81)
RDW: 15.7 % — ABNORMAL HIGH (ref 11.5–15.5)
WBC: 9.8 10*3/uL (ref 4.0–10.5)
nRBC: 0 % (ref 0.0–0.2)

## 2018-11-23 LAB — PROTIME-INR
INR: 2.4 — ABNORMAL HIGH (ref 0.8–1.2)
Prothrombin Time: 26.1 seconds — ABNORMAL HIGH (ref 11.4–15.2)

## 2018-11-23 LAB — TROPONIN I (HIGH SENSITIVITY)
Troponin I (High Sensitivity): 10 ng/L (ref <18)
Troponin I (High Sensitivity): 11 ng/L (ref <18)

## 2018-11-23 LAB — D-DIMER, QUANTITATIVE: D-Dimer, Quant: <0.27 ug/mL-FEU (ref 0.00–0.50)

## 2018-11-23 MED ORDER — SODIUM CHLORIDE 0.9% FLUSH: 3.0000 mL | Freq: Once | INTRAVENOUS | Status: DC

## 2018-11-23 MED ORDER — ALUM & MAG HYDROXIDE-SIMETH 200-200-20 MG/5ML PO SUSP
30.0000 mL | Freq: Once | ORAL | Status: AC
  Administered 2018-11-23: 30 mL via ORAL
  Filled 2018-11-23: qty 30

## 2018-11-23 NOTE — ED Triage Notes (Signed)
Pt reports that he has had pressure to center of chest for several nights which resolved. Approx one hr ago chest pain. Pt reports sitting in chair when pain started.

## 2018-11-23 NOTE — ED Notes (Signed)
Pt ambulated to the restroom at this time to the restroom to obtain a urine sample. Dale Franklin

## 2018-11-23 NOTE — Discharge Instructions (Addendum)
Please follow-up with your primary care provider to help arrange a visit with cardiology for stress testing.

## 2018-11-23 NOTE — ED Provider Notes (Signed)
[facility] EMERGENCY DEPARTMENT Provider Note   CSN: [id number] Arrival date & time: 11/23/18  1528     History   Chief Complaint Chief Complaint  Patient presents with  . Chest Pain    HPI Dale Franklin is a 58 y.o. male.     Patient reports having central chest pressure/burning for the last two nights. Last night, as soon as he laid down, he again developed the chest pressure/burning, and he felt like he was going to pass out. Pressure resolved without intervention within several minutes. Today, while sitting in his chair, he again developed the discomfort that has been more persistent. No shortness of breath. No cough. Did not become diaphoretic or nauseated. History of atrial fib, is on coumadin.  The history is provided by the patient.  Chest Pain Pain location:  Substernal area and epigastric Pain quality: burning and pressure   Pain radiates to:  Does not radiate Pain severity:  Moderate Onset quality:  Sudden Duration:  3 days Timing:  Intermittent Chronicity:  New Context: at rest   Relieved by:  None tried Ineffective treatments:  None tried Associated symptoms: near-syncope   Associated symptoms: no abdominal pain, no back pain, no cough, no fever and no shortness of breath     Past Medical History:  Diagnosis Date  . Arthritis   . Atrial fibrillation ([place])   . Bowel obstruction ([place])   . COPD (chronic obstructive pulmonary disease) ([place])   . Depression   . Diabetes mellitus without complication ([place])   . Dysrhythmia    A flutter  . Hypertension   . Prostate cancer [facility])    metastasized to bone    Patient Active Problem List   Diagnosis Date Noted  . Hematochezia 02/15/2018  . Incarcerated hernia of abdominal cavity 02/14/2018  . Anemia 02/14/2018  . Incisional hernia, without obstruction or gangrene   . Proctocolitis with rectal bleeding 12/30/2017  . Chronic left shoulder pain 12/30/2017  . Acute GI bleeding 11/19/2017  . Diabetes mellitus  type II, non insulin dependent ([place]) 11/19/2017  . Rectal bleeding 10/18/2017  . Obesity (BMI 30-39.9)   . Atrial fibrillation, chronic   . Tobacco abuse 07/21/2017  . COPD (chronic obstructive pulmonary disease) ([place])   . Depression   . Diabetes mellitus without complication ([place])   . Hypertension   . Bladder outlet obstruction 06/01/2016  . Malignant neoplasm of prostate ([place]) 05/31/2016  . Prostate cancer ([place]) 05/11/2016    Past Surgical History:  Procedure Laterality Date  . COLONOSCOPY WITH PROPOFOL N/A 11/04/2017   Procedure: COLONOSCOPY WITH PROPOFOL;  Surgeon: Rogene Houston, MD;  Location: [facility] ENDO SUITE;  Service: Endoscopy;  Laterality: N/A;  2:25  . EXPLORATORY LAPAROTOMY     gastric bleed with subsequent dehiscence, reoperation and temporary mesh (in [place])   . FLEXIBLE SIGMOIDOSCOPY N/A 12/30/2017   Procedure: FLEXIBLE SIGMOIDOSCOPY;  Surgeon: Rogene Houston, MD;  Location: [facility] ENDO SUITE;  Service: Endoscopy;  Laterality: N/A;  . GOLD SEED IMPLANT N/A 05/31/2016   Procedure: GOLD SEED IMPLANT;  Surgeon: Franchot Gallo, MD;  Location: [facility] ORS;  Service: Urology;  Laterality: N/A;  . HAND SURGERY Right   . HERNIA REPAIR     permenant mesh hernia repair after initial Ex lap   . POLYPECTOMY  11/04/2017   Procedure: POLYPECTOMY;  Surgeon: Rogene Houston, MD;  Location: [facility] ENDO SUITE;  Service: Endoscopy;;  cecal polyp (CSx1), transverse colon (CSx2),recto-sigmoid(CSx1)  . TRANSURETHRAL RESECTION OF PROSTATE N/A 05/31/2016  Procedure: TRANSURETHRAL RESECTION OF THE PROSTATE (TURP);  Surgeon: Franchot Gallo, MD;  Location: [facility] ORS;  Service: Urology;  Laterality: N/A;  . WRIST SURGERY Right         Home Medications    Prior to Admission medications   Medication Sig Start Date End Date Taking? Authorizing Provider  albuterol (PROVENTIL HFA;VENTOLIN HFA) 108 (90 Base) MCG/ACT inhaler Inhale 1-2 puffs into the lungs every 6 (six) hours as needed for wheezing or  shortness of breath.    [provider]  DULoxetine (CYMBALTA) 30 MG capsule Take 90 mg by mouth daily.     [provider]  DULoxetine (CYMBALTA) 60 MG capsule TAKE ONE CAPSULE BY MOUTH DAILY. (MORNING) 12/21/17   [provider]  furosemide (LASIX) 20 MG tablet Take 20 mg by mouth daily. 11/25/17   [provider]  glipiZIDE (GLUCOTROL) 10 MG tablet Take 10 mg by mouth 2 (two) times daily.    [provider]  HYDROcodone-acetaminophen (NORCO) 5-325 MG tablet Take 1 tablet by mouth every 4 (four) hours as needed for moderate pain. 03/28/18   Higgs, Mathis Dad, MD  leuprolide (LUPRON) 11.25 MG injection Inject 11.25 mg into the muscle every 4 (four) months.     [provider]  lisinopril-hydrochlorothiazide (PRINZIDE,ZESTORETIC) 10-12.5 MG tablet Take 1 tablet by mouth daily.     [provider]  metFORMIN (GLUCOPHAGE-XR) 500 MG 24 hr tablet Take 500 mg by mouth 2 (two) times daily.     [provider]  methylPREDNISolone (MEDROL DOSEPAK) 4 MG TBPK tablet FOLLOW DIRECTIONS ON PACKAGE. 03/07/18   [provider]  metoprolol succinate (TOPROL-XL) 25 MG 24 hr tablet Take 25 mg by mouth 2 (two) times daily.     [provider]  morphine (MSIR) 15 MG tablet TAKE EVERY 6 HOURS FOR PAIN - TAKE TWO (2) TABLETS BY MOUTH EVERY MORNING, 3 TABLETS FOR THE NEXT TWO DOSES THEN FOUR TABLETS AT BEDTIME. 04/24/18   [provider]  oxybutynin (DITROPAN) 5 MG tablet Take 5 mg by mouth 3 (three) times daily.     [provider]  psyllium (METAMUCIL MULTIHEALTH FIBER) 58.6 % powder Take 2 packets by mouth daily. Patient not taking: Reported on 03/28/2018 11/20/17   Barton Dubois, MD  simvastatin (ZOCOR) 10 MG tablet Take 10 mg by mouth at bedtime. 06/30/17   [provider]  traZODone (DESYREL) 50 MG tablet Take 50 mg by mouth at bedtime.    [provider]  warfarin (COUMADIN) 10 MG tablet Take 1 tablet  (10 mg total) by mouth every evening. Hold until 01/04/2018 01/01/18   Patrecia Pour, MD  warfarin (COUMADIN) 2.5 MG tablet Take 1 tablet (2.5 mg total) by mouth at bedtime. Hold until 01/04/2018 Patient taking differently: Take 2.5 mg by mouth 2 (two) times daily. Take 1 tab in the morning and 1 tab at bedtime 01/01/18   Patrecia Pour, MD    Family History Family History  Problem Relation Age of Onset  . Diabetes Mother   . Cancer Father   . Cancer Paternal Uncle        prostate    Social History Social History   Tobacco Use  . Smoking status: Current Every Day Smoker    Packs/day: 1.00    Years: 41.00    Pack years: 41.00    Types: Cigarettes  . Smokeless tobacco: Never Used  Substance Use Topics  . Alcohol use: Not Currently    Frequency: Never  Comment: occasionally  . Drug use: No     Allergies   Patient has no known allergies.   Review of Systems Review of Systems  Constitutional: Negative for fever.  Respiratory: Negative for cough and shortness of breath.   Cardiovascular: Positive for chest pain and near-syncope.  Gastrointestinal: Negative for abdominal pain.  Musculoskeletal: Negative for back pain.  All other systems reviewed and are negative.    Physical Exam Updated Vital Signs BP 131/85 (BP Location: Right Arm)   Pulse 95   Temp 97.9 F (36.6 C) (Oral)   Resp 18   Ht 6\' 2"  (1.88 m)   Wt 133.8 kg   SpO2 99%   BMI 37.88 kg/m   Physical Exam Vitals signs and nursing note reviewed.  Constitutional:      Appearance: He is well-developed.  HENT:     Head: Normocephalic.  Eyes:     Extraocular Movements: Extraocular movements intact.     Pupils: Pupils are equal, round, and reactive to light.  Cardiovascular:     Rate and Rhythm: Normal rate. Rhythm irregular.  Pulmonary:     Effort: Pulmonary effort is normal.     Breath sounds: Normal breath sounds.  Abdominal:     Palpations: Abdomen is soft.  Musculoskeletal: Normal range of  motion.  Skin:    General: Skin is warm and dry.  Neurological:     Mental Status: He is alert and oriented to person, place, and time.  Psychiatric:        Mood and Affect: Mood normal.      ED Treatments / Results  Labs (all labs ordered are listed, but only abnormal results are displayed) Labs Reviewed  BASIC METABOLIC PANEL  CBC  TROPONIN I (HIGH SENSITIVITY)    EKG EKG Interpretation  Date/Time:  Thursday November 23 2018 15:54:17 EDT Ventricular Rate:  120 PR Interval:    QRS Duration: 138 QT Interval:  360 QTC Calculation: 508 R Axis:   -22 Text Interpretation:  indeterminate rhythym Non-specific intra-ventricular conduction block T wave abnormality, consider anterior ischemia Abnormal ECG Since last tracing rate faster Confirmed by Daleen Bo [phone]) on 11/23/2018 4:56:01 PM   Radiology Dg Chest 2 View  Result Date: 11/23/2018 CLINICAL DATA:  Chest pain EXAM: CHEST - 2 VIEW COMPARISON:  05/05/2018 FINDINGS: The heart size and mediastinal contours are stable. No focal airspace consolidation. No pleural effusion. No pneumothorax. IMPRESSION: No active cardiopulmonary disease. Electronically Signed   By: Davina Poke M.D.   On: 11/23/2018 17:30    Procedures Procedures (including critical care time)  Medications Ordered in ED Medications  sodium chloride flush (NS) 0.9 % injection 3 mL (has no administration in time range)     Initial Impression / Assessment and Plan / ED Course  I have reviewed the triage vital signs and the nursing notes.  Pertinent labs & imaging results that were available during my care of the patient were reviewed by me and considered in my medical decision making (see chart for details).        Patient is to be discharged with recommendation to follow up with PCP in regards to today's hospital visit. Chest pain is not likely of cardiac or pulmonary etiology d/t presentation, normal d-dimer, VSS, no tracheal deviation, no JVD  or new murmur, RRR, breath sounds equal bilaterally, EKG without acute abnormalities, negative troponin, and negative CXR. Pt has been advised start a PPI and return to the ED is CP becomes exertional, associated with  diaphoresis or nausea, radiates to left jaw/arm, worsens or becomes concerning in any way. Recommend patient follow-up with his PCP/cardiologist for stress testing.  Pt appears reliable for follow up and is agreeable to discharge.   Case has been discussed with Dr. Eulis Foster who agrees with the above plan to discharge.   Final Clinical Impressions(s) / ED Diagnoses   Final diagnoses:  Nonspecific chest pain    ED Discharge Orders    None       Etta Quill, NP 11/23/18 2009    Daleen Bo, MD 11/24/18 1014

## 2018-12-12 ENCOUNTER — Ambulatory Visit (INDEPENDENT_AMBULATORY_CARE_PROVIDER_SITE_OTHER): Payer: Medicare Other | Admitting: Urology

## 2018-12-12 DIAGNOSIS — R972 Elevated prostate specific antigen [PSA]: Secondary | ICD-10-CM | POA: Diagnosis not present

## 2018-12-12 DIAGNOSIS — C61 Malignant neoplasm of prostate: Secondary | ICD-10-CM

## 2018-12-12 DIAGNOSIS — Z192 Hormone resistant malignancy status: Secondary | ICD-10-CM

## 2018-12-14 DIAGNOSIS — M79629 Pain in unspecified upper arm: Secondary | ICD-10-CM | POA: Diagnosis not present

## 2018-12-14 DIAGNOSIS — M545 Low back pain: Secondary | ICD-10-CM | POA: Diagnosis not present

## 2018-12-14 DIAGNOSIS — M79641 Pain in right hand: Secondary | ICD-10-CM | POA: Diagnosis not present

## 2018-12-14 DIAGNOSIS — G4733 Obstructive sleep apnea (adult) (pediatric): Secondary | ICD-10-CM | POA: Diagnosis not present

## 2018-12-14 DIAGNOSIS — M25512 Pain in left shoulder: Secondary | ICD-10-CM | POA: Diagnosis not present

## 2018-12-26 DIAGNOSIS — R7989 Other specified abnormal findings of blood chemistry: Secondary | ICD-10-CM | POA: Diagnosis not present

## 2018-12-26 DIAGNOSIS — G893 Neoplasm related pain (acute) (chronic): Secondary | ICD-10-CM | POA: Diagnosis not present

## 2018-12-26 DIAGNOSIS — Z09 Encounter for follow-up examination after completed treatment for conditions other than malignant neoplasm: Secondary | ICD-10-CM | POA: Diagnosis not present

## 2019-01-01 DIAGNOSIS — Z23 Encounter for immunization: Secondary | ICD-10-CM | POA: Diagnosis not present

## 2019-01-01 DIAGNOSIS — C7951 Secondary malignant neoplasm of bone: Secondary | ICD-10-CM | POA: Diagnosis not present

## 2019-01-01 DIAGNOSIS — R7989 Other specified abnormal findings of blood chemistry: Secondary | ICD-10-CM | POA: Diagnosis not present

## 2019-01-01 DIAGNOSIS — K029 Dental caries, unspecified: Secondary | ICD-10-CM | POA: Diagnosis not present

## 2019-01-02 ENCOUNTER — Other Ambulatory Visit: Payer: Self-pay

## 2019-01-02 NOTE — Patient Outreach (Signed)
[facility]) Care Management  01/02/2019  Dale Franklin 1961-01-11 [id number]   Medication Adherence call to Dale Franklin Compliant Voice message left with a call back number.Dale Franklin is showing past due on Simvastatin 10 mg and Glipizide 10 mg under [place].  [place] Management Direct Dial [phone]  Fax [phone] [email or web address]

## 2019-01-16 ENCOUNTER — Other Ambulatory Visit: Payer: Self-pay

## 2019-01-16 NOTE — Patient Outreach (Signed)
[facility]) Care Management  01/16/2019  Lamarious Creager December 28, 1960 [id number]   Medication Adherence call to [name] Hippa Identifiers Verify spoke with patient he is past due on Simvastatin 10 mg,Glipizide 10 mg and Lisinopril/Hctz 10/12.5 mg under,patient explain he is taking all three medication and receives a pill pack every month from [employer]. Mr. Barrone is showing past due under [place].   [place] Management Direct Dial [phone]  Fax [phone] [email or web address]

## 2019-02-20 DIAGNOSIS — M79641 Pain in right hand: Secondary | ICD-10-CM | POA: Diagnosis not present

## 2019-02-20 DIAGNOSIS — G4733 Obstructive sleep apnea (adult) (pediatric): Secondary | ICD-10-CM | POA: Diagnosis not present

## 2019-02-22 DIAGNOSIS — D649 Anemia, unspecified: Secondary | ICD-10-CM | POA: Diagnosis not present

## 2019-02-28 DIAGNOSIS — J209 Acute bronchitis, unspecified: Secondary | ICD-10-CM | POA: Diagnosis not present

## 2019-02-28 DIAGNOSIS — R05 Cough: Secondary | ICD-10-CM | POA: Diagnosis not present

## 2019-03-02 ENCOUNTER — Telehealth: Payer: Self-pay | Admitting: Urology

## 2019-03-02 DIAGNOSIS — C7951 Secondary malignant neoplasm of bone: Secondary | ICD-10-CM | POA: Diagnosis not present

## 2019-03-02 DIAGNOSIS — E119 Type 2 diabetes mellitus without complications: Secondary | ICD-10-CM | POA: Diagnosis not present

## 2019-03-02 DIAGNOSIS — R39198 Other difficulties with micturition: Secondary | ICD-10-CM | POA: Diagnosis not present

## 2019-03-02 DIAGNOSIS — N320 Bladder-neck obstruction: Secondary | ICD-10-CM | POA: Diagnosis not present

## 2019-03-02 DIAGNOSIS — Z09 Encounter for follow-up examination after completed treatment for conditions other than malignant neoplasm: Secondary | ICD-10-CM | POA: Diagnosis not present

## 2019-03-02 NOTE — Telephone Encounter (Signed)
Pt called and requests a return call. He stated he went to his cancer doctor and they suggest he call her regarding questions about a prescription.

## 2019-03-05 ENCOUNTER — Encounter: Payer: Self-pay | Admitting: Urology

## 2019-03-05 NOTE — Telephone Encounter (Signed)
Pt called and has two problems.  Reports increase in back pain. Wondering if you need a CT- pt states he thought his cancer dr was going to reach out and make sure pt does not need another CT to look for mets. ( I have not seen anything from that Dr) Oxybutynin is not helping at this time. You have no warning when he has to urinate.

## 2019-03-06 ENCOUNTER — Other Ambulatory Visit: Payer: Self-pay | Admitting: Urology

## 2019-03-06 DIAGNOSIS — C61 Malignant neoplasm of prostate: Secondary | ICD-10-CM

## 2019-03-06 NOTE — Telephone Encounter (Signed)
Ask what dose of oxybutynin he is on. MAy need to increase or change. Last CT was about a year ago--prob needs bone scan/CT scan now. I can order or have him go thru his oncologist in [place]

## 2019-03-06 NOTE — Telephone Encounter (Signed)
He takes 5mg  TID. He asked if we would order the Ct/bone scan

## 2019-03-14 ENCOUNTER — Telehealth: Payer: Self-pay | Admitting: Urology

## 2019-03-14 DIAGNOSIS — M549 Dorsalgia, unspecified: Secondary | ICD-10-CM | POA: Diagnosis not present

## 2019-03-14 DIAGNOSIS — M5126 Other intervertebral disc displacement, lumbar region: Secondary | ICD-10-CM | POA: Diagnosis not present

## 2019-03-14 DIAGNOSIS — M5136 Other intervertebral disc degeneration, lumbar region: Secondary | ICD-10-CM | POA: Diagnosis not present

## 2019-03-14 DIAGNOSIS — M546 Pain in thoracic spine: Secondary | ICD-10-CM | POA: Diagnosis not present

## 2019-03-14 DIAGNOSIS — R2689 Other abnormalities of gait and mobility: Secondary | ICD-10-CM | POA: Diagnosis not present

## 2019-03-14 NOTE — Telephone Encounter (Addendum)
A referral was sent over on this patient from [place] at [facility], He is already a pt of Dr Diona Fanti. When I called to schedule him He did not think he needs to wait until March with the issues he is having. I will get his records scanned in so you can see them.  He is scheduled for a bone scan and CT abd on 03/20/19 and 03/21/19 fyi. Please advise,

## 2019-03-14 NOTE — Telephone Encounter (Signed)
I scheduled pt for 2/16 appt.

## 2019-03-14 NOTE — Telephone Encounter (Signed)
1pm on 2/16

## 2019-03-20 ENCOUNTER — Encounter (HOSPITAL_COMMUNITY)
Admission: RE | Admit: 2019-03-20 | Discharge: 2019-03-20 | Disposition: A | Discharge status: Home / Self Care | Payer: Medicare Other | Source: Ambulatory Visit | Attending: Urology | Admitting: Urology

## 2019-03-20 ENCOUNTER — Other Ambulatory Visit: Payer: Self-pay

## 2019-03-20 ENCOUNTER — Encounter (HOSPITAL_COMMUNITY): Payer: Self-pay

## 2019-03-20 ENCOUNTER — Other Ambulatory Visit: Payer: Self-pay | Admitting: Urology

## 2019-03-20 DIAGNOSIS — C61 Malignant neoplasm of prostate: Secondary | ICD-10-CM | POA: Insufficient documentation

## 2019-03-20 MED ORDER — TECHNETIUM TC 99M MEDRONATE IV KIT
20.0000 | PACK | Freq: Once | INTRAVENOUS | Status: AC | PRN
  Administered 2019-03-20: 20 via INTRAVENOUS

## 2019-03-21 ENCOUNTER — Ambulatory Visit (HOSPITAL_COMMUNITY): Admission: RE | Admit: 2019-03-21 | Payer: Medicare Other | Source: Ambulatory Visit

## 2019-03-26 NOTE — Telephone Encounter (Signed)
Patient called again today and requests a nurse return his call to discuss a issue he is having.

## 2019-03-27 DIAGNOSIS — M791 Myalgia, unspecified site: Secondary | ICD-10-CM | POA: Diagnosis not present

## 2019-03-27 DIAGNOSIS — M2550 Pain in unspecified joint: Secondary | ICD-10-CM | POA: Diagnosis not present

## 2019-03-27 DIAGNOSIS — E559 Vitamin D deficiency, unspecified: Secondary | ICD-10-CM | POA: Diagnosis not present

## 2019-03-27 DIAGNOSIS — E1165 Type 2 diabetes mellitus with hyperglycemia: Secondary | ICD-10-CM | POA: Diagnosis not present

## 2019-03-27 DIAGNOSIS — I10 Essential (primary) hypertension: Secondary | ICD-10-CM | POA: Diagnosis not present

## 2019-03-27 NOTE — Telephone Encounter (Signed)
Good idea. 

## 2019-03-27 NOTE — Telephone Encounter (Signed)
C/o joint pain, feet pain and abd pain. Pt going to see PCP today at 10:45. Pt concerned of symptoms

## 2019-04-03 ENCOUNTER — Other Ambulatory Visit: Payer: Self-pay

## 2019-04-03 ENCOUNTER — Encounter: Payer: Self-pay | Admitting: Urology

## 2019-04-03 ENCOUNTER — Ambulatory Visit (HOSPITAL_COMMUNITY)
Admission: RE | Admit: 2019-04-03 | Discharge: 2019-04-03 | Disposition: A | Discharge status: Home / Self Care | Payer: Medicare Other | Source: Ambulatory Visit | Attending: Urology | Admitting: Urology

## 2019-04-03 ENCOUNTER — Ambulatory Visit (INDEPENDENT_AMBULATORY_CARE_PROVIDER_SITE_OTHER): Payer: Medicare Other | Admitting: Urology

## 2019-04-03 VITALS — BP 95/55 | HR 57 | Temp 97.5°F | Wt 291.0 lb

## 2019-04-03 DIAGNOSIS — C61 Malignant neoplasm of prostate: Secondary | ICD-10-CM

## 2019-04-03 DIAGNOSIS — M79652 Pain in left thigh: Secondary | ICD-10-CM | POA: Diagnosis not present

## 2019-04-03 DIAGNOSIS — R3915 Urgency of urination: Secondary | ICD-10-CM | POA: Diagnosis not present

## 2019-04-03 DIAGNOSIS — M79651 Pain in right thigh: Secondary | ICD-10-CM | POA: Diagnosis not present

## 2019-04-03 LAB — BLADDER SCAN AMB NON-IMAGING: Scan Result: 147.2

## 2019-04-03 NOTE — Progress Notes (Signed)
Urological Symptom Review  Patient is experiencing the following symptoms: Frequent urination Hard to postpone urination Get up at night to urinate Leakage of urine Stream starts and stops Trouble starting stream Have to strain to urinate Weak stream Erection problems (male only)   Review of Systems  Gastrointestinal (upper)  : Negative for upper GI symptoms  Gastrointestinal (lower) : Negative for lower GI symptoms  Constitutional : Fatigue  Skin: Skin rash/lesion  Eyes: Negative for eye symptoms  Ear/Nose/Throat : Negative for Ear/Nose/Throat symptoms  Hematologic/Lymphatic: Negative for Hematologic/Lymphatic symptoms  Cardiovascular : Negative for cardiovascular symptoms  Respiratory : Shortness of breath  Endocrine: Negative for endocrine symptoms  Musculoskeletal: Back pain Joint pain  Neurological: Dizziness  Psychologic: Anxiety

## 2019-04-03 NOTE — Progress Notes (Signed)
H&P  Chief Complaint: Prostate Cancer  History of Present Illness:   2.16.2021: He returns today for follow-up having had a recent CT and bone-scan. His CT was normal aside from abnormal presentation of lungs (possible bilat pneumonitis) while bone scan largely stable with some areas having improved and others worsened (compression of L1 vertebral body, which could explain recent back pain). In the interval, he has had episodic joint pain -- at one point he felt as though every joint in his body was simultaneously sore. His legs have been largely fine outside of this episode of joint pain. Recent MRI showed improvement to all previously noted bone metastases but also possible new areas of metastatic disease (left 7th rib, left scapula, and bilateral femurs). Of note, he has developed new soreness/pain in his hips.  He continues on 3x oxybutynin daily -- he is tolerating this well and only having issues with constipation when taking morphine. He was recently having issues with frequent urination but this has since improved.   IPSS Questionnaire (AUA-7): Over the past month.   1)  How often have you had a sensation of not emptying your bladder completely after you finish urinating?  3 - About half the time  2)  How often have you had to urinate again less than two hours after you finished urinating? 2 - Less than half the time  3)  How often have you found you stopped and started again several times when you urinated?  3 - About half the time  4) How difficult have you found it to postpone urination?  3 - About half the time  5) How often have you had a weak urinary stream?  3 - About half the time  6) How often have you had to push or strain to begin urination?  3 - About half the time  7) How many times did you most typically get up to urinate from the time you went to bed until the time you got up in the morning?  2 - 2 times  Total score:  0-7 mildly symptomatic   8-19 moderately symptomatic   20-35 severely symptomatic   Total: 19 QoL: 4  (below copied from [facility] records):  Prostate Cancer:  Dale Franklin is a 59 year-old male established patient who is here evaluation for treatment of prostate cancer.  His most recent PSA is <.1.   He has undergone External Beam Radiation Therapy for treatment. He has undergone Hormonal Therapy for treatment.   TRUS/Bx on 2.20.18-- found to have GS 4+5 adenocarcinoma of the prostate with 8/12 biopsy cores positive for malignancy. He did develop urinary retention following his biopsy and required an indwelling catheter temporarily.   He has multiple comorbid conditions including COPD, atrial fibrillation (managed with warfarin), asthma, depression, diabetes, GERD, hypertension, sleep apnea, and morbid obesity (332 lbs).   He initiated ADT on 4.10.2018 (Firmagon 240 mg). He completed EBRT @ [facility] on 9.11.2018.   4.23.2019: He has urinary leakage without sensory awareness. He has no significant urgency or frequency. He is on tamsulosin. He takes as needed oxybutynin. He does have urinary urgency and UUI.   5.29.2019: .4 month Lupron administered. PSA undetectable.   10.1.2019: Most recent PSA 5.1 on 8.5.2019 (T level castrate). No bony pain. Hematochezia evaluated by Dr Laural Golden. 4 polyps found, no rectal abnormalities.   2.4.2020: Most recent PSA 16. He has had increased pain and his back. He also has several other new areas of bony pain.  He does not have significant change in lower urinary tract symptomatology. Bone scan on 2.3.2020 revealed several new metastatic foci-sacrum, iliac, right glenoid, 3rd rib posteriorly, T7, T10, L1 vertebral bodies. CT scan revealed no evidence of pelvic or retroperitoneal adenopathy. Airspace opacities in the lung bases, left greater than right was noted, potentially from pneumonia or aspiration pneumonitis. Progression of compression fracture of L1 vertebral body, umbilical hernia, cardiomegaly,  cholelithiasis.   6.2.2020: On Zytiga/prednisone. Wt up and down. Bowels working well no blood noted. No blood in urine. Daytime urinary frequency. Good stream. his most recent PSA about a month ago was 0.4, testosterone level was castrate.   10.27.2020: Most recent PSA <0.1, T level 3. Here today for Lupron. He reports that his PSA was also recently measured as 0 per his oncologist. He reports that he is overall feeling well but does occasionally wake up with achy pain in his hands ("in the bones"). He was on Xgeva but was taken off this due to some problems with his teeth -- he has not been able to have teeth pulled as recommended by an oral surgeon. This was helped slightly by penicillin but he still needs some follow-up. He denies any blood per urine or stool but he does report that he has been having intermittent hot flashes.    06/22/18 04/30/18 02/28/18 09/19/17 03/08/17 11/02/16 03/16/16  PSA  Total PSA 0.4 ng/dl 49 ng/dl 16.1 ng/dl 5.1 ng/dl < 0.1 ng/dl < 0.1 ng/dl 20.4      05/05/18 09/19/17 03/08/17 11/02/16 04/09/16  Hormones  Testosterone, Total 13 pg/dL 11 pg/dL 15 pg/dL 14 pg/dL 34 pg/dL    Past Medical History:  Diagnosis Date  . Arthritis   . Atrial fibrillation ([place])   . Bowel obstruction ([place])   . COPD (chronic obstructive pulmonary disease) ([place])   . Depression   . Diabetes mellitus without complication ([place])   . Dysrhythmia    A flutter  . Hypertension   . Prostate cancer ([place])    metastasized to bone    Past Surgical History:  Procedure Laterality Date  . COLONOSCOPY WITH PROPOFOL N/A 11/04/2017   Procedure: COLONOSCOPY WITH PROPOFOL;  Surgeon: Rogene Houston, MD;  Location: [facility];  Service: Endoscopy;  Laterality: N/A;  2:25  . EXPLORATORY LAPAROTOMY     gastric bleed with subsequent dehiscence, reoperation and temporary mesh (in [place])   . FLEXIBLE SIGMOIDOSCOPY N/A 12/30/2017   Procedure: FLEXIBLE SIGMOIDOSCOPY;  Surgeon: Rogene Houston,  MD;  Location: [facility];  Service: Endoscopy;  Laterality: N/A;  . GOLD SEED IMPLANT N/A 05/31/2016   Procedure: GOLD SEED IMPLANT;  Surgeon: Franchot Gallo, MD;  Location: [facility];  Service: Urology;  Laterality: N/A;  . HAND SURGERY Right   . HERNIA REPAIR     permenant mesh hernia repair after initial Ex lap   . POLYPECTOMY  11/04/2017   Procedure: POLYPECTOMY;  Surgeon: Rogene Houston, MD;  Location: [facility];  Service: Endoscopy;;  cecal polyp (CSx1), transverse colon (CSx2),recto-sigmoid(CSx1)  . TRANSURETHRAL RESECTION OF PROSTATE N/A 05/31/2016   Procedure: TRANSURETHRAL RESECTION OF THE PROSTATE (TURP);  Surgeon: Franchot Gallo, MD;  Location: [facility];  Service: Urology;  Laterality: N/A;  . WRIST SURGERY Right     Home Medications:  Allergies as of 04/03/2019   No Known Allergies     Medication List       Accurate as of April 03, 2019  9:14 AM. If you have any questions, ask  your nurse or doctor.        albuterol 108 (90 Base) MCG/ACT inhaler Commonly known as: VENTOLIN HFA Inhale 1-2 puffs into the lungs every 6 (six) hours as needed for wheezing or shortness of breath.   DULoxetine 30 MG capsule Commonly known as: CYMBALTA Take 90 mg by mouth daily.   DULoxetine 60 MG capsule Commonly known as: CYMBALTA TAKE ONE CAPSULE BY MOUTH DAILY. (MORNING)   furosemide 20 MG tablet Commonly known as: LASIX Take 20 mg by mouth daily.   glipiZIDE 10 MG tablet Commonly known as: GLUCOTROL Take 10 mg by mouth 2 (two) times daily.   HYDROcodone-acetaminophen 5-325 MG tablet Commonly known as: Norco Take 1 tablet by mouth every 4 (four) hours as needed for moderate pain.   leuprolide 11.25 MG injection Commonly known as: LUPRON Inject 11.25 mg into the muscle every 4 (four) months.   lisinopril-hydrochlorothiazide 10-12.5 MG tablet Commonly known as: ZESTORETIC Take 1 tablet by mouth daily.   metFORMIN 500 MG 24 hr tablet Commonly known as:  GLUCOPHAGE-XR Take 500 mg by mouth 2 (two) times daily.   methylPREDNISolone 4 MG Tbpk tablet Commonly known as: MEDROL DOSEPAK FOLLOW DIRECTIONS ON PACKAGE.   metoprolol succinate 25 MG 24 hr tablet Commonly known as: TOPROL-XL Take 25 mg by mouth 2 (two) times daily.   morphine 15 MG tablet Commonly known as: MSIR TAKE EVERY 6 HOURS FOR PAIN - TAKE TWO (2) TABLETS BY MOUTH EVERY MORNING, 3 TABLETS FOR THE NEXT TWO DOSES THEN FOUR TABLETS AT BEDTIME.   oxybutynin 5 MG tablet Commonly known as: DITROPAN Take 5 mg by mouth 3 (three) times daily.   psyllium 58.6 % powder Commonly known as: Metamucil [employer] Fiber Take 2 packets by mouth daily.   simvastatin 10 MG tablet Commonly known as: ZOCOR Take 10 mg by mouth at bedtime.   traZODone 50 MG tablet Commonly known as: DESYREL Take 50 mg by mouth at bedtime.   warfarin 10 MG tablet Commonly known as: COUMADIN Take 1 tablet (10 mg total) by mouth every evening. Hold until 01/04/2018 What changed: Another medication with the same name was changed. Make sure you understand how and when to take each.   warfarin 2.5 MG tablet Commonly known as: COUMADIN Take 1 tablet (2.5 mg total) by mouth at bedtime. Hold until 01/04/2018 What changed:   when to take this  additional instructions       Allergies: No Known Allergies  Family History  Problem Relation Age of Onset  . Diabetes Mother   . Cancer Father   . Cancer Paternal Uncle        prostate    Social History:  reports that he has been smoking cigarettes. He has a 41.00 pack-year smoking history. He has never used smokeless tobacco. He reports previous alcohol use. He reports that he does not use drugs.  ROS: A complete review of systems was performed.  All systems are negative except for pertinent findings as noted.  Physical Exam:  Vital signs in last 24 hours: There were no vitals taken for this visit. Constitutional:  Alert and oriented, No acute  distress Cardiovascular: Regular rate  Respiratory: Normal respiratory effort GI: Abdomen is soft, nontender, nondistended, no abdominal masses. No CVAT. No hernias.  Genitourinary: Normal male phallus, testes are descended bilaterally and non-tender and without masses, scrotum is normal in appearance without lesions or masses, perineum is normal on inspection. Prostate non-palpable.  Lymphatic: No lymphadenopathy Neurologic: Grossly intact, no  focal deficits Psychiatric: Normal mood and affect  Laboratory Data:  No results for input(s): WBC, HGB, HCT, PLT in the last 72 hours.  No results for input(s): NA, K, CL, GLUCOSE, BUN, CALCIUM, CREATININE in the last 72 hours.  Invalid input(s): CO3   No results found for this or any previous visit (from the past 24 hour(s)). No results found for this or any previous visit (from the past 240 hour(s)).  I have reviewed prior pt notes  I have reviewed notes from referring/previous physicians  I have reviewed urinalysis results  I have independently reviewed prior imaging  I have reviewed prior PSA results   Radiologic Imaging: 2.2.2021: Multiple sites of uptake within osseous structures consistent with osseous metastatic disease.  When compared to the previous study, findings represent an mixed response to therapy, with resolution of 2 previously seen lesions and identification of 3 new lesions as above.  Recommend radiographic correlation of the femora bilaterally to exclude lesions at risk of pathologic fracture.          Impression/Assessment:  We went over results of recent CT, bone scan, and MRI -- these results were largely good news but there were possibly new areas of metastatic disease. Additionally, I think it would be a good idea to have an x-ray of his hips done given he has developed new hip pain. DRE today is normal.   Plan:  1. I will have bilateraL FEMORAL XRs scheduled -- if possible, we will have this done  today. Will call to follow-up on results.   2. Continue on current medical regimen for his urinary sx's.   3. Return in 2 wks for 4 mo lupron only.   4. Return in 5 mo's for OV.  5. Would rec that his Med Onc MD consider [place] See monthly

## 2019-04-05 ENCOUNTER — Encounter: Payer: Self-pay | Admitting: Urology

## 2019-04-06 DIAGNOSIS — R7989 Other specified abnormal findings of blood chemistry: Secondary | ICD-10-CM | POA: Diagnosis not present

## 2019-04-06 DIAGNOSIS — C7951 Secondary malignant neoplasm of bone: Secondary | ICD-10-CM | POA: Diagnosis not present

## 2019-04-06 DIAGNOSIS — Z09 Encounter for follow-up examination after completed treatment for conditions other than malignant neoplasm: Secondary | ICD-10-CM | POA: Diagnosis not present

## 2019-04-06 DIAGNOSIS — K029 Dental caries, unspecified: Secondary | ICD-10-CM | POA: Diagnosis not present

## 2019-04-06 DIAGNOSIS — E559 Vitamin D deficiency, unspecified: Secondary | ICD-10-CM | POA: Diagnosis not present

## 2019-04-09 ENCOUNTER — Telehealth: Payer: Self-pay

## 2019-04-09 NOTE — Telephone Encounter (Signed)
-----   Message from Franchot Gallo, MD sent at 04/09/2019  9:48 AM EST ----- Regarding: femur x-ray  The please let patient know that I have looked at his femur x-ray.  He has a small spot in his thigh bone, about 3 in above his knee is probably a cancer.  I am going to get up with Dr. Harlen Labs to see if this should be treated. ----- Message ----- From: Dorisann Frames, RN Sent: 04/04/2019   8:22 AM EST To: Franchot Gallo, MD

## 2019-04-09 NOTE — Telephone Encounter (Signed)
Pt notified of results

## 2019-04-17 ENCOUNTER — Other Ambulatory Visit: Payer: Self-pay

## 2019-04-17 ENCOUNTER — Ambulatory Visit (INDEPENDENT_AMBULATORY_CARE_PROVIDER_SITE_OTHER): Payer: Medicare Other

## 2019-04-17 VITALS — Temp 97.2°F

## 2019-04-17 DIAGNOSIS — C61 Malignant neoplasm of prostate: Secondary | ICD-10-CM

## 2019-04-17 MED ORDER — LEUPROLIDE ACETATE (4 MONTH) 30 MG IM KIT: 30.0000 mg | PACK | Freq: Once | INTRAMUSCULAR | Status: DC

## 2019-04-17 MED ORDER — LEUPROLIDE ACETATE (4 MONTH) 30 MG IM KIT
30.0000 mg | PACK | Freq: Once | INTRAMUSCULAR | Status: AC
  Administered 2019-04-17: 30 mg via INTRAMUSCULAR

## 2019-04-18 DIAGNOSIS — Z923 Personal history of irradiation: Secondary | ICD-10-CM | POA: Diagnosis not present

## 2019-04-18 DIAGNOSIS — C7951 Secondary malignant neoplasm of bone: Secondary | ICD-10-CM | POA: Diagnosis not present

## 2019-04-18 DIAGNOSIS — G893 Neoplasm related pain (acute) (chronic): Secondary | ICD-10-CM | POA: Diagnosis not present

## 2019-05-01 DIAGNOSIS — D649 Anemia, unspecified: Secondary | ICD-10-CM | POA: Diagnosis not present

## 2019-05-04 DIAGNOSIS — K029 Dental caries, unspecified: Secondary | ICD-10-CM | POA: Diagnosis not present

## 2019-05-04 DIAGNOSIS — C7951 Secondary malignant neoplasm of bone: Secondary | ICD-10-CM | POA: Diagnosis not present

## 2019-05-04 DIAGNOSIS — Z09 Encounter for follow-up examination after completed treatment for conditions other than malignant neoplasm: Secondary | ICD-10-CM | POA: Diagnosis not present

## 2019-05-08 ENCOUNTER — Ambulatory Visit: Payer: Medicare Other | Admitting: Urology

## 2019-05-08 DIAGNOSIS — C7951 Secondary malignant neoplasm of bone: Secondary | ICD-10-CM | POA: Diagnosis not present

## 2019-05-08 DIAGNOSIS — S32010G Wedge compression fracture of first lumbar vertebra, subsequent encounter for fracture with delayed healing: Secondary | ICD-10-CM | POA: Diagnosis not present

## 2019-05-16 DIAGNOSIS — M25512 Pain in left shoulder: Secondary | ICD-10-CM | POA: Diagnosis not present

## 2019-05-16 DIAGNOSIS — M79641 Pain in right hand: Secondary | ICD-10-CM | POA: Diagnosis not present

## 2019-05-16 DIAGNOSIS — E1140 Type 2 diabetes mellitus with diabetic neuropathy, unspecified: Secondary | ICD-10-CM | POA: Diagnosis not present

## 2019-05-16 DIAGNOSIS — M542 Cervicalgia: Secondary | ICD-10-CM | POA: Diagnosis not present

## 2019-05-16 DIAGNOSIS — G894 Chronic pain syndrome: Secondary | ICD-10-CM | POA: Diagnosis not present

## 2019-05-16 DIAGNOSIS — M545 Low back pain: Secondary | ICD-10-CM | POA: Diagnosis not present

## 2019-05-22 DIAGNOSIS — E119 Type 2 diabetes mellitus without complications: Secondary | ICD-10-CM | POA: Diagnosis not present

## 2019-05-22 DIAGNOSIS — G4730 Sleep apnea, unspecified: Secondary | ICD-10-CM | POA: Diagnosis not present

## 2019-05-22 DIAGNOSIS — J449 Chronic obstructive pulmonary disease, unspecified: Secondary | ICD-10-CM | POA: Diagnosis not present

## 2019-05-22 DIAGNOSIS — I10 Essential (primary) hypertension: Secondary | ICD-10-CM | POA: Diagnosis not present

## 2019-05-22 DIAGNOSIS — I4891 Unspecified atrial fibrillation: Secondary | ICD-10-CM | POA: Diagnosis not present

## 2019-06-05 DIAGNOSIS — G893 Neoplasm related pain (acute) (chronic): Secondary | ICD-10-CM | POA: Diagnosis not present

## 2019-06-05 DIAGNOSIS — R7989 Other specified abnormal findings of blood chemistry: Secondary | ICD-10-CM | POA: Diagnosis not present

## 2019-06-05 DIAGNOSIS — I10 Essential (primary) hypertension: Secondary | ICD-10-CM | POA: Diagnosis not present

## 2019-06-08 DIAGNOSIS — Z09 Encounter for follow-up examination after completed treatment for conditions other than malignant neoplasm: Secondary | ICD-10-CM | POA: Diagnosis not present

## 2019-06-08 DIAGNOSIS — K029 Dental caries, unspecified: Secondary | ICD-10-CM | POA: Diagnosis not present

## 2019-06-08 DIAGNOSIS — Z01818 Encounter for other preprocedural examination: Secondary | ICD-10-CM | POA: Diagnosis not present

## 2019-06-11 DIAGNOSIS — S32010G Wedge compression fracture of first lumbar vertebra, subsequent encounter for fracture with delayed healing: Secondary | ICD-10-CM | POA: Diagnosis not present

## 2019-06-11 DIAGNOSIS — I10 Essential (primary) hypertension: Secondary | ICD-10-CM | POA: Diagnosis not present

## 2019-06-11 DIAGNOSIS — Z95810 Presence of automatic (implantable) cardiac defibrillator: Secondary | ICD-10-CM | POA: Diagnosis not present

## 2019-06-11 DIAGNOSIS — J449 Chronic obstructive pulmonary disease, unspecified: Secondary | ICD-10-CM | POA: Diagnosis not present

## 2019-06-11 DIAGNOSIS — Z79899 Other long term (current) drug therapy: Secondary | ICD-10-CM | POA: Diagnosis not present

## 2019-06-11 DIAGNOSIS — G4733 Obstructive sleep apnea (adult) (pediatric): Secondary | ICD-10-CM | POA: Diagnosis not present

## 2019-06-11 DIAGNOSIS — Z7984 Long term (current) use of oral hypoglycemic drugs: Secondary | ICD-10-CM | POA: Diagnosis not present

## 2019-06-11 DIAGNOSIS — E119 Type 2 diabetes mellitus without complications: Secondary | ICD-10-CM | POA: Diagnosis not present

## 2019-06-11 DIAGNOSIS — M4856XA Collapsed vertebra, not elsewhere classified, lumbar region, initial encounter for fracture: Secondary | ICD-10-CM | POA: Diagnosis not present

## 2019-06-11 DIAGNOSIS — S32019A Unspecified fracture of first lumbar vertebra, initial encounter for closed fracture: Secondary | ICD-10-CM | POA: Diagnosis not present

## 2019-06-11 DIAGNOSIS — Z7901 Long term (current) use of anticoagulants: Secondary | ICD-10-CM | POA: Diagnosis not present

## 2019-06-11 DIAGNOSIS — Z9641 Presence of insulin pump (external) (internal): Secondary | ICD-10-CM | POA: Diagnosis not present

## 2019-06-11 DIAGNOSIS — C7951 Secondary malignant neoplasm of bone: Secondary | ICD-10-CM | POA: Diagnosis not present

## 2019-06-18 DIAGNOSIS — M542 Cervicalgia: Secondary | ICD-10-CM | POA: Diagnosis not present

## 2019-06-18 DIAGNOSIS — R61 Generalized hyperhidrosis: Secondary | ICD-10-CM | POA: Diagnosis not present

## 2019-06-18 DIAGNOSIS — M549 Dorsalgia, unspecified: Secondary | ICD-10-CM | POA: Diagnosis not present

## 2019-07-02 DIAGNOSIS — R7989 Other specified abnormal findings of blood chemistry: Secondary | ICD-10-CM | POA: Diagnosis not present

## 2019-07-02 DIAGNOSIS — C7951 Secondary malignant neoplasm of bone: Secondary | ICD-10-CM | POA: Diagnosis not present

## 2019-07-02 DIAGNOSIS — E559 Vitamin D deficiency, unspecified: Secondary | ICD-10-CM | POA: Diagnosis not present

## 2019-07-06 DIAGNOSIS — R937 Abnormal findings on diagnostic imaging of other parts of musculoskeletal system: Secondary | ICD-10-CM | POA: Diagnosis not present

## 2019-07-06 DIAGNOSIS — C7951 Secondary malignant neoplasm of bone: Secondary | ICD-10-CM | POA: Diagnosis not present

## 2019-07-25 DIAGNOSIS — Z09 Encounter for follow-up examination after completed treatment for conditions other than malignant neoplasm: Secondary | ICD-10-CM | POA: Diagnosis not present

## 2019-07-25 DIAGNOSIS — G893 Neoplasm related pain (acute) (chronic): Secondary | ICD-10-CM | POA: Diagnosis not present

## 2019-07-25 DIAGNOSIS — C7951 Secondary malignant neoplasm of bone: Secondary | ICD-10-CM | POA: Diagnosis not present

## 2019-07-26 DIAGNOSIS — M25512 Pain in left shoulder: Secondary | ICD-10-CM | POA: Diagnosis not present

## 2019-07-26 DIAGNOSIS — M79602 Pain in left arm: Secondary | ICD-10-CM | POA: Diagnosis not present

## 2019-07-27 DIAGNOSIS — Z01818 Encounter for other preprocedural examination: Secondary | ICD-10-CM | POA: Diagnosis not present

## 2019-07-30 DIAGNOSIS — Z7984 Long term (current) use of oral hypoglycemic drugs: Secondary | ICD-10-CM | POA: Diagnosis not present

## 2019-07-30 DIAGNOSIS — M1990 Unspecified osteoarthritis, unspecified site: Secondary | ICD-10-CM | POA: Diagnosis not present

## 2019-07-30 DIAGNOSIS — I517 Cardiomegaly: Secondary | ICD-10-CM | POA: Diagnosis not present

## 2019-07-30 DIAGNOSIS — Z79899 Other long term (current) drug therapy: Secondary | ICD-10-CM | POA: Diagnosis not present

## 2019-07-30 DIAGNOSIS — E1142 Type 2 diabetes mellitus with diabetic polyneuropathy: Secondary | ICD-10-CM | POA: Diagnosis not present

## 2019-07-30 DIAGNOSIS — Z452 Encounter for adjustment and management of vascular access device: Secondary | ICD-10-CM | POA: Diagnosis not present

## 2019-07-30 DIAGNOSIS — Z7901 Long term (current) use of anticoagulants: Secondary | ICD-10-CM | POA: Diagnosis not present

## 2019-07-30 DIAGNOSIS — E119 Type 2 diabetes mellitus without complications: Secondary | ICD-10-CM | POA: Diagnosis not present

## 2019-07-30 DIAGNOSIS — I10 Essential (primary) hypertension: Secondary | ICD-10-CM | POA: Diagnosis not present

## 2019-07-30 DIAGNOSIS — J449 Chronic obstructive pulmonary disease, unspecified: Secondary | ICD-10-CM | POA: Diagnosis not present

## 2019-07-30 DIAGNOSIS — I4891 Unspecified atrial fibrillation: Secondary | ICD-10-CM | POA: Diagnosis not present

## 2019-07-31 DIAGNOSIS — C7951 Secondary malignant neoplasm of bone: Secondary | ICD-10-CM | POA: Diagnosis not present

## 2019-07-31 DIAGNOSIS — Z5111 Encounter for antineoplastic chemotherapy: Secondary | ICD-10-CM | POA: Diagnosis not present

## 2019-08-01 DIAGNOSIS — C7951 Secondary malignant neoplasm of bone: Secondary | ICD-10-CM | POA: Diagnosis not present

## 2019-08-01 DIAGNOSIS — Z09 Encounter for follow-up examination after completed treatment for conditions other than malignant neoplasm: Secondary | ICD-10-CM | POA: Diagnosis not present

## 2019-08-01 DIAGNOSIS — G893 Neoplasm related pain (acute) (chronic): Secondary | ICD-10-CM | POA: Diagnosis not present

## 2019-08-07 ENCOUNTER — Telehealth: Payer: Self-pay

## 2019-08-07 DIAGNOSIS — L03119 Cellulitis of unspecified part of limb: Secondary | ICD-10-CM | POA: Diagnosis not present

## 2019-08-07 NOTE — Telephone Encounter (Signed)
Thanks

## 2019-08-17 ENCOUNTER — Other Ambulatory Visit: Payer: Self-pay

## 2019-08-17 DIAGNOSIS — J449 Chronic obstructive pulmonary disease, unspecified: Secondary | ICD-10-CM | POA: Diagnosis not present

## 2019-08-17 DIAGNOSIS — R3915 Urgency of urination: Secondary | ICD-10-CM

## 2019-08-17 DIAGNOSIS — Z7901 Long term (current) use of anticoagulants: Secondary | ICD-10-CM | POA: Diagnosis not present

## 2019-08-17 DIAGNOSIS — C7951 Secondary malignant neoplasm of bone: Secondary | ICD-10-CM | POA: Diagnosis not present

## 2019-08-17 DIAGNOSIS — I10 Essential (primary) hypertension: Secondary | ICD-10-CM | POA: Diagnosis not present

## 2019-08-17 DIAGNOSIS — Z7984 Long term (current) use of oral hypoglycemic drugs: Secondary | ICD-10-CM | POA: Diagnosis not present

## 2019-08-17 DIAGNOSIS — Z791 Long term (current) use of non-steroidal anti-inflammatories (NSAID): Secondary | ICD-10-CM | POA: Diagnosis not present

## 2019-08-17 DIAGNOSIS — G893 Neoplasm related pain (acute) (chronic): Secondary | ICD-10-CM | POA: Diagnosis not present

## 2019-08-17 DIAGNOSIS — R590 Localized enlarged lymph nodes: Secondary | ICD-10-CM | POA: Diagnosis not present

## 2019-08-17 DIAGNOSIS — Z5329 Procedure and treatment not carried out because of patient's decision for other reasons: Secondary | ICD-10-CM | POA: Diagnosis not present

## 2019-08-17 DIAGNOSIS — Z87891 Personal history of nicotine dependence: Secondary | ICD-10-CM | POA: Diagnosis not present

## 2019-08-17 DIAGNOSIS — Z09 Encounter for follow-up examination after completed treatment for conditions other than malignant neoplasm: Secondary | ICD-10-CM | POA: Diagnosis not present

## 2019-08-17 DIAGNOSIS — Z923 Personal history of irradiation: Secondary | ICD-10-CM | POA: Diagnosis not present

## 2019-08-17 DIAGNOSIS — Z86010 Personal history of colonic polyps: Secondary | ICD-10-CM | POA: Diagnosis not present

## 2019-08-17 MED ORDER — OXYBUTYNIN CHLORIDE 5 MG PO TABS: 5.0000 mg | ORAL_TABLET | Freq: Three times a day (TID) | ORAL | 11 refills | Status: AC

## 2019-08-21 DIAGNOSIS — Z5111 Encounter for antineoplastic chemotherapy: Secondary | ICD-10-CM | POA: Diagnosis not present

## 2019-08-21 DIAGNOSIS — C7951 Secondary malignant neoplasm of bone: Secondary | ICD-10-CM | POA: Diagnosis not present

## 2019-08-27 DIAGNOSIS — Z79899 Other long term (current) drug therapy: Secondary | ICD-10-CM | POA: Diagnosis not present

## 2019-08-27 DIAGNOSIS — E860 Dehydration: Secondary | ICD-10-CM | POA: Diagnosis not present

## 2019-08-27 DIAGNOSIS — K219 Gastro-esophageal reflux disease without esophagitis: Secondary | ICD-10-CM | POA: Diagnosis not present

## 2019-08-27 DIAGNOSIS — Z7984 Long term (current) use of oral hypoglycemic drugs: Secondary | ICD-10-CM | POA: Diagnosis not present

## 2019-08-27 DIAGNOSIS — M549 Dorsalgia, unspecified: Secondary | ICD-10-CM | POA: Diagnosis not present

## 2019-08-27 DIAGNOSIS — G4730 Sleep apnea, unspecified: Secondary | ICD-10-CM | POA: Diagnosis not present

## 2019-08-27 DIAGNOSIS — R109 Unspecified abdominal pain: Secondary | ICD-10-CM | POA: Diagnosis not present

## 2019-08-27 DIAGNOSIS — K529 Noninfective gastroenteritis and colitis, unspecified: Secondary | ICD-10-CM | POA: Diagnosis not present

## 2019-08-27 DIAGNOSIS — I10 Essential (primary) hypertension: Secondary | ICD-10-CM | POA: Diagnosis not present

## 2019-08-27 DIAGNOSIS — E119 Type 2 diabetes mellitus without complications: Secondary | ICD-10-CM | POA: Diagnosis not present

## 2019-08-27 DIAGNOSIS — Z87820 Personal history of traumatic brain injury: Secondary | ICD-10-CM | POA: Diagnosis not present

## 2019-09-04 ENCOUNTER — Ambulatory Visit: Payer: Medicare Other | Admitting: Urology

## 2019-09-07 DIAGNOSIS — C7951 Secondary malignant neoplasm of bone: Secondary | ICD-10-CM | POA: Diagnosis not present

## 2019-09-07 DIAGNOSIS — Z09 Encounter for follow-up examination after completed treatment for conditions other than malignant neoplasm: Secondary | ICD-10-CM | POA: Diagnosis not present

## 2019-09-07 DIAGNOSIS — D649 Anemia, unspecified: Secondary | ICD-10-CM | POA: Diagnosis not present

## 2019-09-07 DIAGNOSIS — R7989 Other specified abnormal findings of blood chemistry: Secondary | ICD-10-CM | POA: Diagnosis not present

## 2019-09-11 ENCOUNTER — Ambulatory Visit: Payer: Medicare Other | Admitting: Urology

## 2019-09-11 DIAGNOSIS — C7951 Secondary malignant neoplasm of bone: Secondary | ICD-10-CM | POA: Diagnosis not present

## 2019-09-11 DIAGNOSIS — Z5111 Encounter for antineoplastic chemotherapy: Secondary | ICD-10-CM | POA: Diagnosis not present

## 2019-09-28 DIAGNOSIS — Z09 Encounter for follow-up examination after completed treatment for conditions other than malignant neoplasm: Secondary | ICD-10-CM | POA: Diagnosis not present

## 2019-09-28 DIAGNOSIS — C7951 Secondary malignant neoplasm of bone: Secondary | ICD-10-CM | POA: Diagnosis not present

## 2019-09-28 DIAGNOSIS — K625 Hemorrhage of anus and rectum: Secondary | ICD-10-CM | POA: Diagnosis not present

## 2019-09-28 DIAGNOSIS — N320 Bladder-neck obstruction: Secondary | ICD-10-CM | POA: Diagnosis not present

## 2019-10-02 DIAGNOSIS — C7951 Secondary malignant neoplasm of bone: Secondary | ICD-10-CM | POA: Diagnosis not present

## 2019-10-02 DIAGNOSIS — Z5111 Encounter for antineoplastic chemotherapy: Secondary | ICD-10-CM | POA: Diagnosis not present

## 2019-10-09 ENCOUNTER — Other Ambulatory Visit: Payer: Self-pay

## 2019-10-09 ENCOUNTER — Ambulatory Visit (INDEPENDENT_AMBULATORY_CARE_PROVIDER_SITE_OTHER): Payer: Medicare Other | Admitting: Urology

## 2019-10-09 ENCOUNTER — Encounter: Payer: Self-pay | Admitting: Urology

## 2019-10-09 VITALS — BP 101/69 | HR 77 | Temp 98.6°F | Ht 75.0 in | Wt 280.0 lb

## 2019-10-09 DIAGNOSIS — R9721 Rising PSA following treatment for malignant neoplasm of prostate: Secondary | ICD-10-CM | POA: Diagnosis not present

## 2019-10-09 DIAGNOSIS — C7951 Secondary malignant neoplasm of bone: Secondary | ICD-10-CM

## 2019-10-09 DIAGNOSIS — R3915 Urgency of urination: Secondary | ICD-10-CM

## 2019-10-09 DIAGNOSIS — Z515 Encounter for palliative care: Secondary | ICD-10-CM | POA: Diagnosis not present

## 2019-10-09 DIAGNOSIS — C61 Malignant neoplasm of prostate: Secondary | ICD-10-CM

## 2019-10-09 NOTE — Progress Notes (Signed)
Urological Symptom Review  Patient is experiencing the following symptoms: Frequent urination Hard to postpone urination Get up at night to urinate Leakage of urine Stream starts and stops Trouble starting stream Have to strain to urinate Erection problems (male only)   Review of Systems  Gastrointestinal (upper)  : Nausea Indigestion/heartburn  Gastrointestinal (lower) : Negative for lower GI symptoms  Constitutional : Negative for symptoms  Skin: Negative for skin symptoms  Eyes: Negative for eye symptoms  Ear/Nose/Throat : Negative for Ear/Nose/Throat symptoms  Hematologic/Lymphatic: Negative for Hematologic/Lymphatic symptoms  Cardiovascular : Negative for cardiovascular symptoms  Respiratory : Negative for respiratory symptoms  Endocrine: Negative for endocrine symptoms  Musculoskeletal: Back pain Joint pain  Neurological: Dizziness  Psychologic: Depression Anxiety

## 2019-10-09 NOTE — Progress Notes (Signed)
H&P  Chief Complaint: Prostate Cancer  History of Present Illness:   8.24.2021: Pt started Chemo approx. 3 months prior and receives treatment every 21 days. Pt has apparently discontinued Zytiga. Pt reports that he is not taking Xgeva due to dental issues. Pt is non-compliant with his Vitamin D regimen.  Pt reports that his most recent PSA was 1.4. 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  Pt received lupron on 7.2.2021 during his oncologist visit. (Last 4 mo injection here 03/2019).  Pt notes that his urinary symptoms are stable, submits that he is able to void his bladder and denies any recent hematuria.  Prior Hx:    TRUS/Bx on 2.20.18-- found to have GS 4+5 adenocarcinoma of the prostate with 8/12 biopsy cores positive for malignancy. He did develop urinary retention following his biopsy and required an indwelling catheter temporarily.   He has multiple comorbid conditions including COPD, atrial fibrillation (managed with warfarin), asthma, depression, diabetes, GERD, hypertension, sleep apnea, and morbid obesity (332 lbs).   He initiated ADT on 4.10.2018 (Firmagon 240 mg). He completed EBRT @ [facility] on 9.11.2018.   4.23.2019: He has urinary leakage without sensory awareness. He has no significant urgency or frequency. He is on tamsulosin. He takes as needed oxybutynin. He does have urinary urgency and UUI.   5.29.2019: 4 month Lupron administered. PSA undetectable.   10.1.2019: Most recent PSA 5.1 on 8.5.2019 (T level castrate). No bony pain. Hematochezia evaluated by Dr Laural Golden. 4 polyps found, no rectal abnormalities.   2.4.2020: Most recent PSA 16. He has had increased pain and his back. He also has several other new areas of bony pain. He does not have significant change in lower urinary tract symptomatology. Bone scan on 2.3.2020 revealed several new metastatic foci-sacrum, iliac, right glenoid, 3rd rib posteriorly,  T7, T10, L1 vertebral bodies. CT scan revealed no evidence of pelvic or retroperitoneal adenopathy. Airspace opacities in the lung bases, left greater than right was noted, potentially from pneumonia or aspiration pneumonitis. Progression of compression fracture of L1 vertebral body, umbilical hernia, cardiomegaly, cholelithiasis.   6.2.2020: On Zytiga/prednisone. Wt up and down. Bowels working well no blood noted. No blood in urine. Daytime urinary frequency. Good stream. his most recent PSA about a month ago was 0.4, testosterone level was castrate.   10.27.2020: Most recent PSA <0.1, T level 3. Here today for Lupron. He reports that his PSA was also recently measured as 0 per his oncologist. He reports that he is overall feeling well but does occasionally wake up with achy pain in his hands ("in the bones"). He was on Xgeva but was taken off this due to some problems with his teeth -- he has not been able to have teeth pulled as recommended by an oral surgeon. This was helped slightly by penicillin but he still needs some follow-up. He denies any blood per urine or stool but he does report that he has been having intermittent hot flashes.  2.16.2021: He returns today for follow-up having had a recent CT and bone-scan. His CT was normal aside from abnormal presentation of lungs (possible bilat pneumonitis) while bone scan largely stable with some areas having improved and others worsened (compression of L1 vertebral body, which could explain recent back pain). In the interval, he has had episodic joint pain -- at one point he felt as though every joint in his body was simultaneously sore. His legs have been largely fine outside of this episode of joint  pain. Recent MRI showed improvement to all previously noted bone metastases but also possible new areas of metastatic disease (left 7th rib, left scapula, and bilateral femurs). Of note, he has developed new soreness/pain in his hips.  He continues on 3x  oxybutynin daily -- he is tolerating this well and only having issues with constipation when taking morphine. He was recently having issues with frequent urination but this has since improved.   Past Medical History:  Diagnosis Date  . Arthritis   . Atrial fibrillation ([place])   . Bowel obstruction ([place])   . COPD (chronic obstructive pulmonary disease) ([place])   . Depression   . Diabetes mellitus without complication ([place])   . Dysrhythmia    A flutter  . Hypertension   . Prostate cancer ([place])    metastasized to bone    Past Surgical History:  Procedure Laterality Date  . COLONOSCOPY WITH PROPOFOL N/A 11/04/2017   Procedure: COLONOSCOPY WITH PROPOFOL;  Surgeon: Rogene Houston, MD;  Location: [facility];  Service: Endoscopy;  Laterality: N/A;  2:25  . EXPLORATORY LAPAROTOMY     gastric bleed with subsequent dehiscence, reoperation and temporary mesh (in [place])   . FLEXIBLE SIGMOIDOSCOPY N/A 12/30/2017   Procedure: FLEXIBLE SIGMOIDOSCOPY;  Surgeon: Rogene Houston, MD;  Location: [facility];  Service: Endoscopy;  Laterality: N/A;  . GOLD SEED IMPLANT N/A 05/31/2016   Procedure: GOLD SEED IMPLANT;  Surgeon: Franchot Gallo, MD;  Location: [facility];  Service: Urology;  Laterality: N/A;  . HAND SURGERY Right   . HERNIA REPAIR     permenant mesh hernia repair after initial Ex lap   . POLYPECTOMY  11/04/2017   Procedure: POLYPECTOMY;  Surgeon: Rogene Houston, MD;  Location: [facility];  Service: Endoscopy;;  cecal polyp (CSx1), transverse colon (CSx2),recto-sigmoid(CSx1)  . TRANSURETHRAL RESECTION OF PROSTATE N/A 05/31/2016   Procedure: TRANSURETHRAL RESECTION OF THE PROSTATE (TURP);  Surgeon: Franchot Gallo, MD;  Location: [facility];  Service: Urology;  Laterality: N/A;  . WRIST SURGERY Right     Home Medications:  Allergies as of 10/09/2019   No Known Allergies     Medication List       Accurate as of October 09, 2019  9:25 AM. If you have any questions, ask your nurse  or doctor.        albuterol 108 (90 Base) MCG/ACT inhaler Commonly known as: VENTOLIN HFA Inhale 1-2 puffs into the lungs every 6 (six) hours as needed for wheezing or shortness of breath.   Cholecalciferol 50 MCG (2000 UT) Caps Take by mouth.   DULoxetine 30 MG capsule Commonly known as: CYMBALTA Take 90 mg by mouth daily.   DULoxetine 60 MG capsule Commonly known as: CYMBALTA TAKE ONE CAPSULE BY MOUTH DAILY. (MORNING)   furosemide 20 MG tablet Commonly known as: LASIX Take 20 mg by mouth daily.   glipiZIDE 10 MG tablet Commonly known as: GLUCOTROL Take 10 mg by mouth 2 (two) times daily.   HYDROcodone-acetaminophen 5-325 MG tablet Commonly known as: Norco Take 1 tablet by mouth every 4 (four) hours as needed for moderate pain.   leuprolide 11.25 MG injection Commonly known as: LUPRON Inject 11.25 mg into the muscle every 4 (four) months.   lisinopril-hydrochlorothiazide 10-12.5 MG tablet Commonly known as: ZESTORETIC Take 1 tablet by mouth daily.   metFORMIN 500 MG 24 hr tablet Commonly known as: GLUCOPHAGE-XR Take 500 mg by mouth 2 (two) times daily.   methocarbamol 500 MG tablet Commonly  known as: ROBAXIN SMARTSIG:1-1.5 Tablet(s) By Mouth Every 8 Hours PRN   methylPREDNISolone 4 MG Tbpk tablet Commonly known as: MEDROL DOSEPAK FOLLOW DIRECTIONS ON PACKAGE.   metoprolol succinate 25 MG 24 hr tablet Commonly known as: TOPROL-XL Take 25 mg by mouth 2 (two) times daily.   morphine 15 MG tablet Commonly known as: MSIR TAKE EVERY 6 HOURS FOR PAIN - TAKE TWO (2) TABLETS BY MOUTH EVERY MORNING, 3 TABLETS FOR THE NEXT TWO DOSES THEN FOUR TABLETS AT BEDTIME.   morphine 30 MG tablet Commonly known as: MSIR SMARTSIG:0.5 Tablet(s) By Mouth Every 4 Hours PRN   Narcan 4 MG/0.1ML Liqd nasal spray kit Generic drug: naloxone 1 spray.   oxybutynin 5 MG tablet Commonly known as: DITROPAN Take 1 tablet (5 mg total) by mouth 3 (three) times daily.   penicillin v  potassium 500 MG tablet Commonly known as: VEETID TAKE TWO (2) TABLETS BY MOUTH NOW THEN ONE TABLET FOUR TIMES DAILY UNTIL ALL TAKEN.   predniSONE 20 MG tablet Commonly known as: DELTASONE Take 40 mg by mouth daily.   psyllium 58.6 % powder Commonly known as: Metamucil [employer] Fiber Take 2 packets by mouth daily.   simvastatin 10 MG tablet Commonly known as: ZOCOR Take 10 mg by mouth at bedtime.   traZODone 50 MG tablet Commonly known as: DESYREL Take 50 mg by mouth at bedtime.   warfarin 10 MG tablet Commonly known as: COUMADIN Take 1 tablet (10 mg total) by mouth every evening. Hold until 01/04/2018 What changed: Another medication with the same name was changed. Make sure you understand how and when to take each.   warfarin 2.5 MG tablet Commonly known as: COUMADIN Take 1 tablet (2.5 mg total) by mouth at bedtime. Hold until 01/04/2018 What changed:   when to take this  additional instructions   Zytiga 250 MG tablet Generic drug: abiraterone acetate TAKE 4 TABLETS BY MOUTH ONCE DAILY ON AN EMPTY STOMACH WITH 8 OZ WATER AS DIRECTED.  TAKE 1 HOUR BEFORE OR 2 HOURS AFTER A MEAL.DO NOT CRUSH OR CHEW       Allergies: No Known Allergies  Family History  Problem Relation Age of Onset  . Diabetes Mother   . Cancer Father   . Cancer Paternal Uncle        prostate    Social History:  reports that he has been smoking cigarettes. He has a 41.00 pack-year smoking history. He has never used smokeless tobacco. He reports previous alcohol use. He reports that he does not use drugs.  ROS: A complete review of systems was performed.  All systems are negative except for pertinent findings as noted.  Physical Exam:  Vital signs in last 24 hours: There were no vitals taken for this visit. Constitutional:  Alert and oriented, No acute distress Cardiovascular: Regular rate  Respiratory: Normal respiratory effort Neurologic: Grossly intact, no focal deficits Psychiatric:  Normal mood and affect  I have reviewed prior pt notes  I have reviewed notes from referring/previous physicians  I have independently reviewed prior imaging  I have reviewed prior PSA results Impression/Assessment:  Metastatic castrate resistant prostate cancer, now on docetaxel which she seems to be tolerating very well. He is now receiving androgen deprivation therapy at the [facility] office.  Plan:  1. Pt advised to take Vitamin D. No need for further Lupron at this office.  2. F/U in 4 months for symptom recheck.

## 2019-10-19 DIAGNOSIS — G893 Neoplasm related pain (acute) (chronic): Secondary | ICD-10-CM | POA: Diagnosis not present

## 2019-10-19 DIAGNOSIS — C7951 Secondary malignant neoplasm of bone: Secondary | ICD-10-CM | POA: Diagnosis not present

## 2019-10-19 DIAGNOSIS — Z09 Encounter for follow-up examination after completed treatment for conditions other than malignant neoplasm: Secondary | ICD-10-CM | POA: Diagnosis not present

## 2019-10-23 DIAGNOSIS — Z5111 Encounter for antineoplastic chemotherapy: Secondary | ICD-10-CM | POA: Diagnosis not present

## 2019-10-23 DIAGNOSIS — C7951 Secondary malignant neoplasm of bone: Secondary | ICD-10-CM | POA: Diagnosis not present

## 2019-11-06 DIAGNOSIS — J209 Acute bronchitis, unspecified: Secondary | ICD-10-CM | POA: Diagnosis not present

## 2019-11-06 DIAGNOSIS — J019 Acute sinusitis, unspecified: Secondary | ICD-10-CM | POA: Diagnosis not present

## 2019-11-08 DIAGNOSIS — C7951 Secondary malignant neoplasm of bone: Secondary | ICD-10-CM | POA: Diagnosis not present

## 2019-11-08 DIAGNOSIS — Z515 Encounter for palliative care: Secondary | ICD-10-CM | POA: Diagnosis not present

## 2019-11-09 DIAGNOSIS — E119 Type 2 diabetes mellitus without complications: Secondary | ICD-10-CM | POA: Diagnosis not present

## 2019-11-09 DIAGNOSIS — Z09 Encounter for follow-up examination after completed treatment for conditions other than malignant neoplasm: Secondary | ICD-10-CM | POA: Diagnosis not present

## 2019-11-09 DIAGNOSIS — L98491 Non-pressure chronic ulcer of skin of other sites limited to breakdown of skin: Secondary | ICD-10-CM | POA: Diagnosis not present

## 2019-11-09 DIAGNOSIS — Z720 Tobacco use: Secondary | ICD-10-CM | POA: Diagnosis not present

## 2019-11-09 DIAGNOSIS — K625 Hemorrhage of anus and rectum: Secondary | ICD-10-CM | POA: Diagnosis not present

## 2019-11-09 DIAGNOSIS — C7951 Secondary malignant neoplasm of bone: Secondary | ICD-10-CM | POA: Diagnosis not present

## 2019-11-09 DIAGNOSIS — G893 Neoplasm related pain (acute) (chronic): Secondary | ICD-10-CM | POA: Diagnosis not present

## 2019-11-09 DIAGNOSIS — K432 Incisional hernia without obstruction or gangrene: Secondary | ICD-10-CM | POA: Diagnosis not present

## 2019-11-20 DIAGNOSIS — J441 Chronic obstructive pulmonary disease with (acute) exacerbation: Secondary | ICD-10-CM | POA: Diagnosis not present

## 2019-11-23 DIAGNOSIS — J441 Chronic obstructive pulmonary disease with (acute) exacerbation: Secondary | ICD-10-CM | POA: Diagnosis not present

## 2019-11-28 DIAGNOSIS — I4891 Unspecified atrial fibrillation: Secondary | ICD-10-CM | POA: Diagnosis not present

## 2019-11-28 DIAGNOSIS — Z7984 Long term (current) use of oral hypoglycemic drugs: Secondary | ICD-10-CM | POA: Diagnosis not present

## 2019-11-28 DIAGNOSIS — F17210 Nicotine dependence, cigarettes, uncomplicated: Secondary | ICD-10-CM | POA: Diagnosis not present

## 2019-11-28 DIAGNOSIS — Z7901 Long term (current) use of anticoagulants: Secondary | ICD-10-CM | POA: Diagnosis not present

## 2019-11-28 DIAGNOSIS — L98491 Non-pressure chronic ulcer of skin of other sites limited to breakdown of skin: Secondary | ICD-10-CM | POA: Diagnosis not present

## 2019-11-28 DIAGNOSIS — Z7952 Long term (current) use of systemic steroids: Secondary | ICD-10-CM | POA: Diagnosis not present

## 2019-11-28 DIAGNOSIS — E11622 Type 2 diabetes mellitus with other skin ulcer: Secondary | ICD-10-CM | POA: Diagnosis not present

## 2019-11-28 DIAGNOSIS — L98498 Non-pressure chronic ulcer of skin of other sites with other specified severity: Secondary | ICD-10-CM | POA: Diagnosis not present

## 2019-11-28 DIAGNOSIS — Z86010 Personal history of colonic polyps: Secondary | ICD-10-CM | POA: Diagnosis not present

## 2019-11-28 DIAGNOSIS — G893 Neoplasm related pain (acute) (chronic): Secondary | ICD-10-CM | POA: Diagnosis not present

## 2019-11-28 DIAGNOSIS — K439 Ventral hernia without obstruction or gangrene: Secondary | ICD-10-CM | POA: Diagnosis not present

## 2019-11-28 DIAGNOSIS — C7951 Secondary malignant neoplasm of bone: Secondary | ICD-10-CM | POA: Diagnosis not present

## 2019-11-28 DIAGNOSIS — Z9221 Personal history of antineoplastic chemotherapy: Secondary | ICD-10-CM | POA: Diagnosis not present

## 2019-11-28 DIAGNOSIS — I10 Essential (primary) hypertension: Secondary | ICD-10-CM | POA: Diagnosis not present

## 2019-12-05 DIAGNOSIS — J449 Chronic obstructive pulmonary disease, unspecified: Secondary | ICD-10-CM | POA: Diagnosis not present

## 2019-12-05 DIAGNOSIS — Z515 Encounter for palliative care: Secondary | ICD-10-CM | POA: Diagnosis not present

## 2019-12-05 DIAGNOSIS — E1140 Type 2 diabetes mellitus with diabetic neuropathy, unspecified: Secondary | ICD-10-CM | POA: Diagnosis not present

## 2019-12-05 IMAGING — CT CT ABD-PELV W/ CM
2 of 5 series · 15 of 46 positions shown, 17 images · IV contrast (Isovue)
Comparison: None.

CLINICAL DATA: Intermittent rectal bleeding. History of prostate
carcinoma.

EXAM:
CT ABDOMEN AND PELVIS WITH CONTRAST
TECHNIQUE: Multidetector CT imaging of the abdomen and pelvis was performed
using the standard protocol following bolus administration of
intravenous contrast.
CONTRAST:  100mL HE8IXD-5LL IOPAMIDOL (HE8IXD-5LL) INJECTION 61%

[Series 2: axial st · axial · 0.98mm/px · z∈[-589,-174]mm · 12 of 97 slices shown, 14 images]
[im 7/97  soft-tissue]
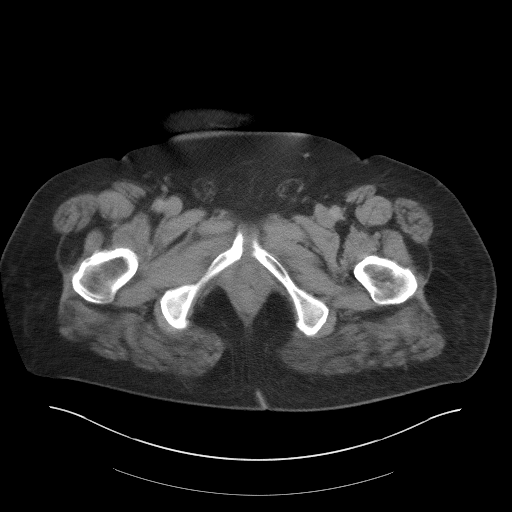
[im 7/97  bone]
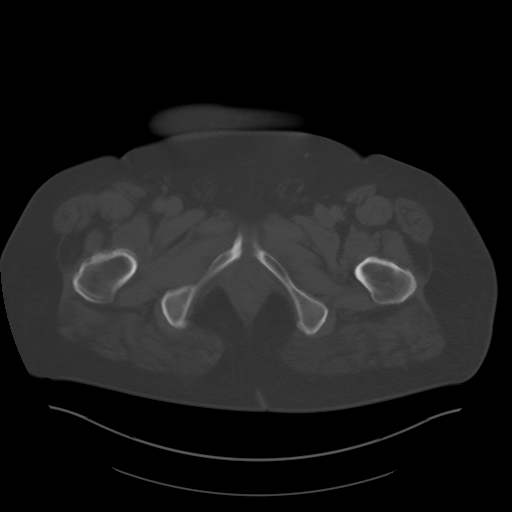
[im 13/97  soft-tissue]
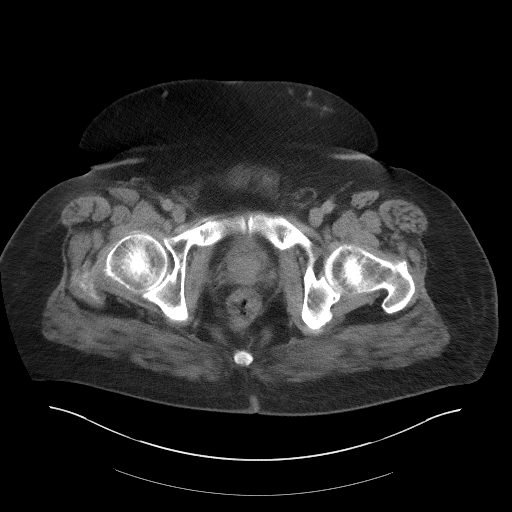
[im 20/97  soft-tissue]
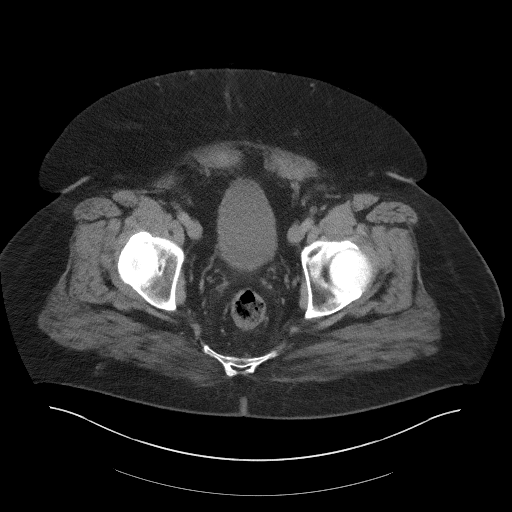
[im 33/97  soft-tissue]
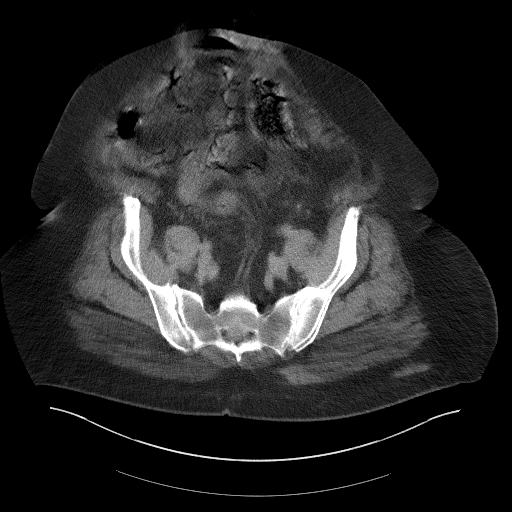
[im 39/97  soft-tissue]
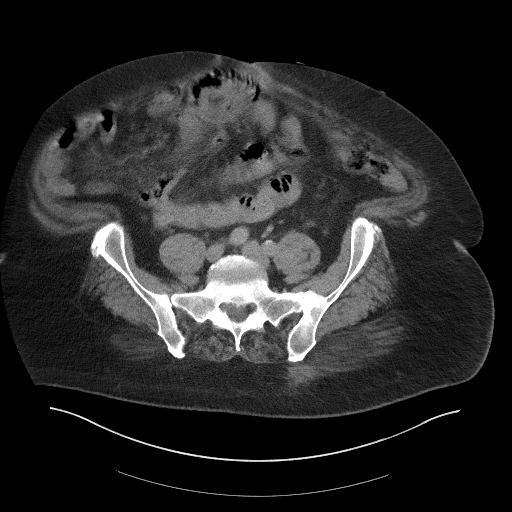
[im 45/97  soft-tissue]
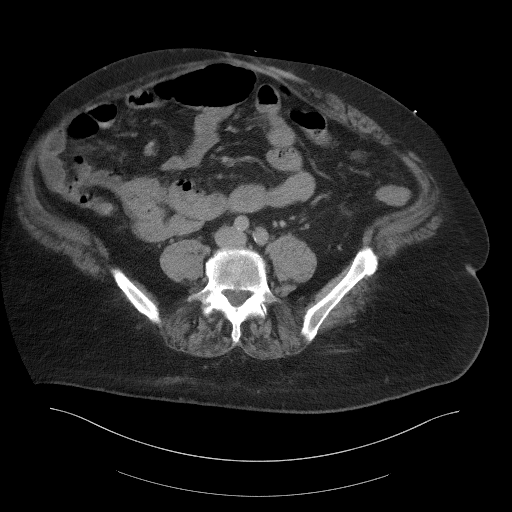
[im 52/97  soft-tissue]
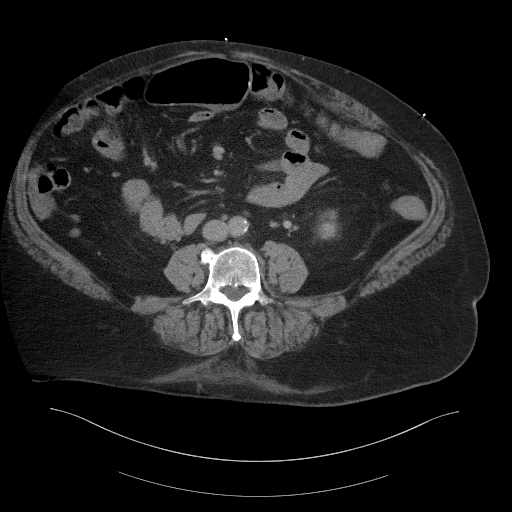
[im 58/97  soft-tissue]
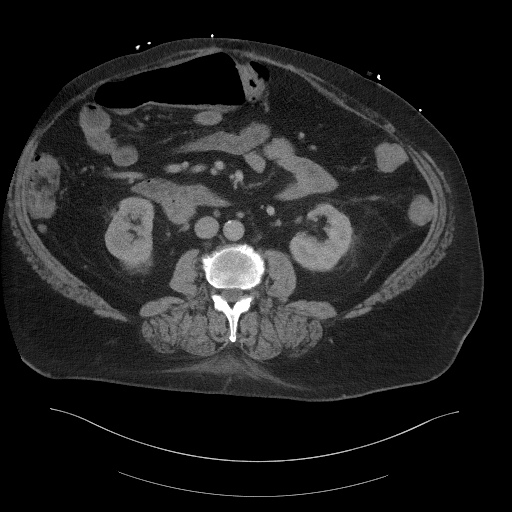
[im 65/97  soft-tissue]
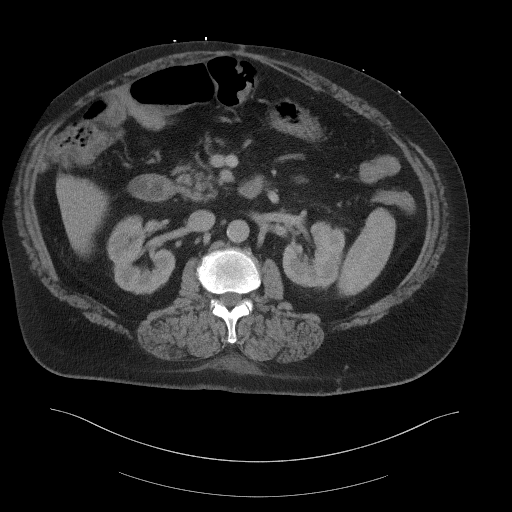
[im 65/97  bone]
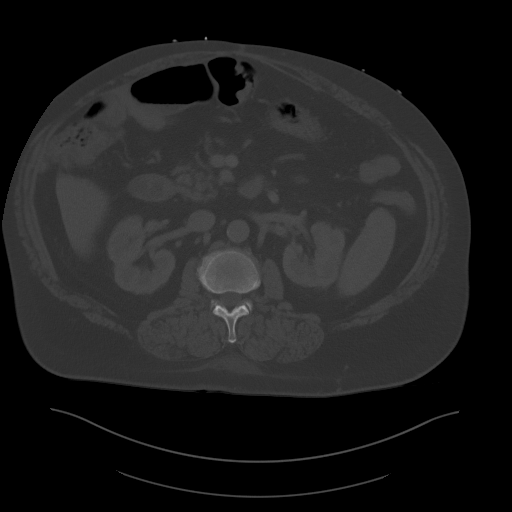
[im 77/97  soft-tissue]
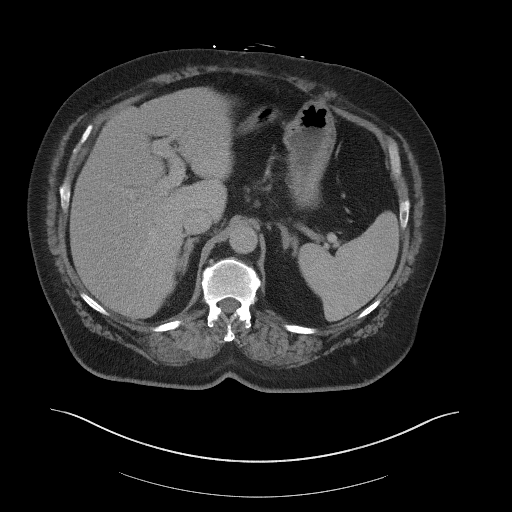
[im 84/97  soft-tissue]
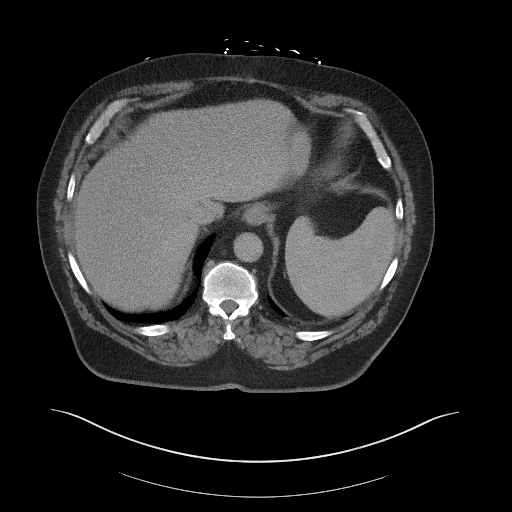
[im 90/97  soft-tissue]
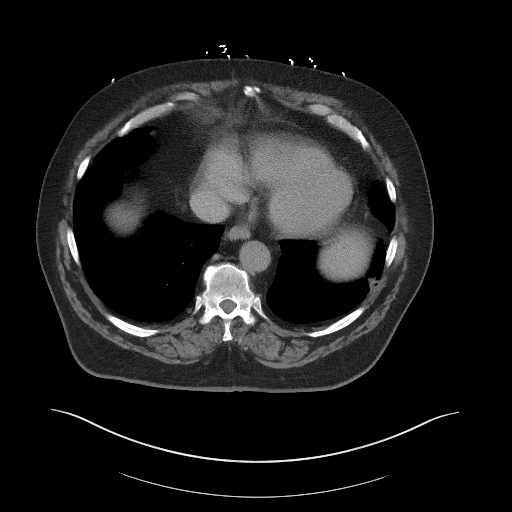

[Series 5: coronal st · coronal · 0.94mm/px · 3 of 119 slices shown]
[im 40/119  soft-tissue]
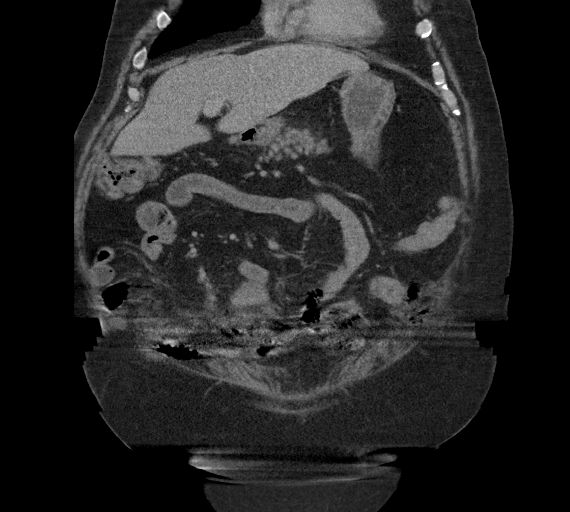
[im 53/119  soft-tissue]
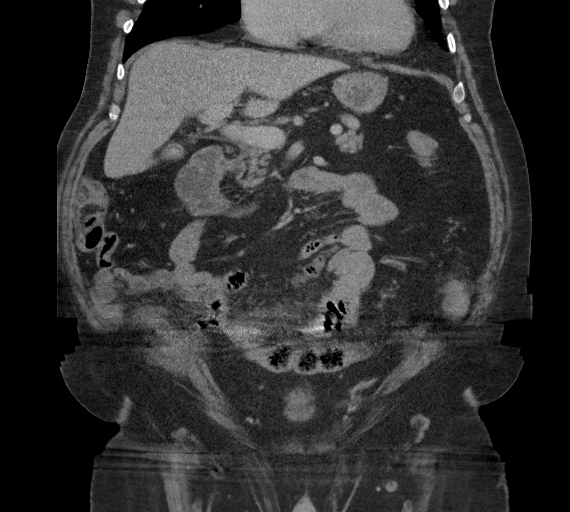
[im 66/119  soft-tissue]
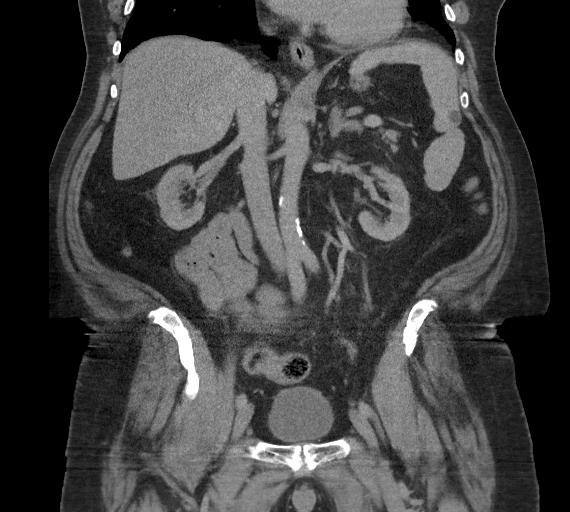

[15 of 46 positions shown; findings below may reference images not displayed]

FINDINGS: Lower chest: There is scarring in the left lung base region. No lung
base edema or consolidation.

Hepatobiliary: No focal liver lesions are appreciable. There are
small gallstones in the gallbladder. The gallbladder wall does not
appear appreciably thickened. There is no biliary duct dilatation.

Pancreas: There is no evident pancreatic mass or inflammatory focus.

Spleen: There is a cyst arising from the anterior spleen measuring
1.1 x 1.0 cm. No other splenic lesions are evident.

Adrenals/Urinary Tract: There is a stable nodular opacity in the
anterior right adrenal measuring 1.0 x 0.7 cm. Adrenals bilaterally
otherwise appear normal. Kidneys bilaterally show no evident mass or
hydronephrosis on either side. There is no renal or ureteral
calculus on either side. Urinary bladder is midline with wall
thickness within normal limits.

Stomach/Bowel: There is mild rectal wall thickening. No fistula or
perirectal stranding evident. No bowel wall thickening is noted
elsewhere. Note that there is a midline lower abdominal wall hernia
which contains loops of small and large bowel without bowel
compromise evident. There is no demonstrable bowel obstruction.
There is no free air or portal venous air.

Vascular/Lymphatic: There is aortic and iliac artery
atherosclerosis. No aneurysm evident. Major mesenteric arterial
vessels are patent. There is no appreciable adenopathy in the
abdomen or pelvis.

Reproductive: Seed implants are noted in the prostate. The prostate
and seminal vesicles are normal in size and contour. No pelvic mass
is demonstrated on this study.

Other: Appendix appears normal. No abscess or ascites is evident in
the abdomen or pelvis. There is a ventral hernia in the lower
abdomen which contains bowel. The neck of the hernia from right to
left dimension measures 7.7 cm. Superior to inferior dimension of
the hernia measures 4.5 cm.

Musculoskeletal: There are no blastic or lytic bone lesions. There
is no intramuscular lesion.
IMPRESSION: 1. Wall thickening in the rectum, felt to represent a degree of
proctitis. No perirectal stranding or abnormal fluid. Question
proctitis as cause for rectal bleeding. Given rectal bleeding,
direct visualization of the colon is felt to be warranted after
appropriate colonic cleansing.

2. Lower abdominal ventral hernia which contains loops of small and
large bowel without bowel compromise.

3.  Cholelithiasis.

4. No bowel obstruction. No abscess in the abdomen or pelvis.
Appendix appears normal.

5.  Status post seed implant therapy for prostate carcinoma.

6.  No renal or ureteral calculus.  No hydronephrosis.

7.  Stable small nodular opacity in the right adrenal.

## 2019-12-06 DIAGNOSIS — C7951 Secondary malignant neoplasm of bone: Secondary | ICD-10-CM | POA: Diagnosis not present

## 2019-12-06 DIAGNOSIS — Z515 Encounter for palliative care: Secondary | ICD-10-CM | POA: Diagnosis not present

## 2019-12-06 DIAGNOSIS — E1140 Type 2 diabetes mellitus with diabetic neuropathy, unspecified: Secondary | ICD-10-CM | POA: Diagnosis not present

## 2019-12-06 DIAGNOSIS — J949 Pleural condition, unspecified: Secondary | ICD-10-CM | POA: Diagnosis not present

## 2019-12-11 DIAGNOSIS — Z7689 Persons encountering health services in other specified circumstances: Secondary | ICD-10-CM | POA: Diagnosis not present

## 2019-12-20 DIAGNOSIS — L98499 Non-pressure chronic ulcer of skin of other sites with unspecified severity: Secondary | ICD-10-CM | POA: Diagnosis not present

## 2019-12-20 DIAGNOSIS — C7951 Secondary malignant neoplasm of bone: Secondary | ICD-10-CM | POA: Diagnosis not present

## 2019-12-20 DIAGNOSIS — Z515 Encounter for palliative care: Secondary | ICD-10-CM | POA: Diagnosis not present

## 2019-12-21 DIAGNOSIS — G893 Neoplasm related pain (acute) (chronic): Secondary | ICD-10-CM | POA: Diagnosis not present

## 2019-12-21 DIAGNOSIS — K432 Incisional hernia without obstruction or gangrene: Secondary | ICD-10-CM | POA: Diagnosis not present

## 2019-12-21 DIAGNOSIS — Z720 Tobacco use: Secondary | ICD-10-CM | POA: Diagnosis not present

## 2019-12-21 DIAGNOSIS — Z09 Encounter for follow-up examination after completed treatment for conditions other than malignant neoplasm: Secondary | ICD-10-CM | POA: Diagnosis not present

## 2019-12-26 DIAGNOSIS — K439 Ventral hernia without obstruction or gangrene: Secondary | ICD-10-CM | POA: Diagnosis not present

## 2019-12-26 DIAGNOSIS — K432 Incisional hernia without obstruction or gangrene: Secondary | ICD-10-CM | POA: Diagnosis not present

## 2019-12-26 DIAGNOSIS — C7951 Secondary malignant neoplasm of bone: Secondary | ICD-10-CM | POA: Diagnosis not present

## 2019-12-26 DIAGNOSIS — M8448XD Pathological fracture, other site, subsequent encounter for fracture with routine healing: Secondary | ICD-10-CM | POA: Diagnosis not present

## 2019-12-26 DIAGNOSIS — I700 Atherosclerosis of aorta: Secondary | ICD-10-CM | POA: Diagnosis not present

## 2019-12-26 DIAGNOSIS — K5939 Other megacolon: Secondary | ICD-10-CM | POA: Diagnosis not present

## 2020-02-12 ENCOUNTER — Ambulatory Visit: Payer: Medicare Other | Admitting: Urology

## 2020-02-12 NOTE — Progress Notes (Incomplete)
H&P  Chief Complaint: ***  History of Present Illness:  12.28.2021:   (below copied from [facility] records):  TRUS/Bx on 2.20.18-- found to have GS 4+5 adenocarcinoma of the prostate with 8/12 biopsy cores positive for malignancy. He did develop urinary retention following his biopsy and required an indwelling catheter temporarily.   He has multiple comorbid conditions including COPD, atrial fibrillation (managed with warfarin), asthma, depression, diabetes, GERD, hypertension, sleep apnea, and morbid obesity (332 lbs).   He initiated ADT on 4.10.2018 (Firmagon 240 mg). He completed EBRT @ [facility] on 9.11.2018.   4.23.2019: He has urinary leakage without sensory awareness. He has no significant urgency or frequency. He is on tamsulosin. He takes as needed oxybutynin. He does have urinary urgency and UUI.   5.29.2019: 4 month Lupron administered. PSA undetectable.   10.1.2019: Most recent PSA 5.1 on 8.5.2019 (T level castrate). No bony pain. Hematochezia evaluated by Dr Laural Golden. 4 polyps found, no rectal abnormalities.   2.4.2020: Most recent PSA 16. He has had increased pain and his back. He also has several other new areas of bony pain. He does not have significant change in lower urinary tract symptomatology. Bone scan on 2.3.2020 revealed several new metastatic foci-sacrum, iliac, right glenoid, 3rd rib posteriorly, T7, T10, L1 vertebral bodies. CT scan revealed no evidence of pelvic or retroperitoneal adenopathy. Airspace opacities in the lung bases, left greater than right was noted, potentially from pneumonia or aspiration pneumonitis. Progression of compression fracture of L1 vertebral body, umbilical hernia, cardiomegaly, cholelithiasis.   6.2.2020: On Zytiga/prednisone. Wt up and down. Bowels working well no blood noted. No blood in urine. Daytime urinary frequency. Good stream. his most recent PSA about a month ago was 0.4, testosterone level was castrate.    10.27.2020: Most recent PSA <0.1, T level 3. Here today for Lupron. He reports that his PSA was also recently measured as 0 per his oncologist. He reports that he is overall feeling well but does occasionally wake up with achy pain in his hands ("in the bones"). He was on Xgeva but was taken off this due to some problems with his teeth -- he has not been able to have teeth pulled as recommended by an oral surgeon. This was helped slightly by penicillin but he still needs some follow-up. He denies any blood per urine or stool but he does report that he has been having intermittent hot flashes.  2.16.2021:He returns today for follow-up having had a recent CT and bone-scan. His CT was normal aside from abnormal presentation of lungs (possiblebilat pneumonitis) while bone scan largely stable with some areas having improved and others worsened (compression of L1 vertebral body, which could explain recent back pain). In the interval, he has had episodic joint pain -- at one point he felt as though every joint in his body was simultaneously sore. His legs have been largely fine outside of this episode of joint pain. Recent MRI showed improvement to all previously noted bone metastasesbut alsopossible new areasof metastatic disease (left 7th rib, left scapula, and bilateral femurs). Of note, he has developed new soreness/pain in his hips.  He continues on 3x oxybutynin daily -- he is tolerating this well and only having issues with constipation when taking morphine. He was recently having issues with frequent urination but this has since improved.  8.24.2021: Pt started Chemo approx. 3 months prior and receives treatment every 21 days. Pt has apparently discontinued Zytiga. Pt reports that he is not taking Xgeva due to  dental issues. Pt is non-compliant with his Vitamin D regimen.  Pt reports that his most recent PSA was 1.4. 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  Pt received lupron on 7.2.2021 during his oncologist visit. (Last 4 mo injection here 03/2019).  Pt notes that his urinary symptoms are stable, submits that he is able to void his bladder and denies any recent hematuria.  Past Medical History:  Diagnosis Date  . Arthritis   . Atrial fibrillation ([place])   . Bowel obstruction ([place])   . COPD (chronic obstructive pulmonary disease) ([place])   . Depression   . Diabetes mellitus without complication ([place])   . Dysrhythmia    A flutter  . Hypertension   . Prostate cancer ([place])    metastasized to bone    Past Surgical History:  Procedure Laterality Date  . COLONOSCOPY WITH PROPOFOL N/A 11/04/2017   Procedure: COLONOSCOPY WITH PROPOFOL;  Surgeon: Rogene Houston, MD;  Location: [facility];  Service: Endoscopy;  Laterality: N/A;  2:25  . EXPLORATORY LAPAROTOMY     gastric bleed with subsequent dehiscence, reoperation and temporary mesh (in [place])   . FLEXIBLE SIGMOIDOSCOPY N/A 12/30/2017   Procedure: FLEXIBLE SIGMOIDOSCOPY;  Surgeon: Rogene Houston, MD;  Location: [facility];  Service: Endoscopy;  Laterality: N/A;  . GOLD SEED IMPLANT N/A 05/31/2016   Procedure: GOLD SEED IMPLANT;  Surgeon: Franchot Gallo, MD;  Location: [facility];  Service: Urology;  Laterality: N/A;  . HAND SURGERY Right   . HERNIA REPAIR     permenant mesh hernia repair after initial Ex lap   . POLYPECTOMY  11/04/2017   Procedure: POLYPECTOMY;  Surgeon: Rogene Houston, MD;  Location: [facility];  Service: Endoscopy;;  cecal polyp (CSx1), transverse colon (CSx2),recto-sigmoid(CSx1)  . TRANSURETHRAL RESECTION OF PROSTATE N/A 05/31/2016   Procedure: TRANSURETHRAL RESECTION OF THE PROSTATE (TURP);  Surgeon: Franchot Gallo, MD;  Location: [facility];  Service: Urology;  Laterality: N/A;  . WRIST SURGERY Right     Home Medications:  Allergies as of 02/12/2020   No Known Allergies     Medication List       Accurate as of February 12, 2020  9:11  AM. If you have any questions, ask your nurse or doctor.        albuterol 108 (90 Base) MCG/ACT inhaler Commonly known as: VENTOLIN HFA Inhale 1-2 puffs into the lungs every 6 (six) hours as needed for wheezing or shortness of breath.   Cholecalciferol 50 MCG (2000 UT) Caps Take by mouth.   DULoxetine 30 MG capsule Commonly known as: CYMBALTA Take 90 mg by mouth daily.   DULoxetine 60 MG capsule Commonly known as: CYMBALTA TAKE ONE CAPSULE BY MOUTH DAILY. (MORNING)   furosemide 20 MG tablet Commonly known as: LASIX Take 20 mg by mouth daily.   glipiZIDE 10 MG tablet Commonly known as: GLUCOTROL Take 10 mg by mouth 2 (two) times daily.   HYDROcodone-acetaminophen 5-325 MG tablet Commonly known as: Norco Take 1 tablet by mouth every 4 (four) hours as needed for moderate pain.   leuprolide 11.25 MG injection Commonly known as: LUPRON Inject 11.25 mg into the muscle every 4 (four) months.   lisinopril-hydrochlorothiazide 10-12.5 MG tablet Commonly known as: ZESTORETIC Take 1 tablet by mouth daily.   metFORMIN 500 MG 24 hr tablet Commonly known as: GLUCOPHAGE-XR Take 500 mg by mouth 2 (two) times daily.   methocarbamol 500 MG tablet Commonly known as: ROBAXIN SMARTSIG:1-1.5  Tablet(s) By Mouth Every 8 Hours PRN   methylPREDNISolone 4 MG Tbpk tablet Commonly known as: MEDROL DOSEPAK FOLLOW DIRECTIONS ON PACKAGE.   metoprolol succinate 25 MG 24 hr tablet Commonly known as: TOPROL-XL Take 25 mg by mouth 2 (two) times daily.   morphine 15 MG tablet Commonly known as: MSIR TAKE EVERY 6 HOURS FOR PAIN - TAKE TWO (2) TABLETS BY MOUTH EVERY MORNING, 3 TABLETS FOR THE NEXT TWO DOSES THEN FOUR TABLETS AT BEDTIME.   morphine 30 MG tablet Commonly known as: MSIR SMARTSIG:0.5 Tablet(s) By Mouth Every 4 Hours PRN   Narcan 4 MG/0.1ML Liqd nasal spray kit Generic drug: naloxone 1 spray.   oxybutynin 5 MG tablet Commonly known as: DITROPAN Take 1 tablet (5 mg total) by  mouth 3 (three) times daily.   penicillin v potassium 500 MG tablet Commonly known as: VEETID TAKE TWO (2) TABLETS BY MOUTH NOW THEN ONE TABLET FOUR TIMES DAILY UNTIL ALL TAKEN.   predniSONE 20 MG tablet Commonly known as: DELTASONE Take 40 mg by mouth daily.   psyllium 58.6 % powder Commonly known as: Metamucil [facility] Fiber Take 2 packets by mouth daily.   simvastatin 10 MG tablet Commonly known as: ZOCOR Take 10 mg by mouth at bedtime.   traZODone 50 MG tablet Commonly known as: DESYREL Take 50 mg by mouth at bedtime.   warfarin 10 MG tablet Commonly known as: COUMADIN Take 1 tablet (10 mg total) by mouth every evening. Hold until 01/04/2018 What changed: Another medication with the same name was changed. Make sure you understand how and when to take each.   warfarin 2.5 MG tablet Commonly known as: COUMADIN Take 1 tablet (2.5 mg total) by mouth at bedtime. Hold until 01/04/2018 What changed:   when to take this  additional instructions   Zytiga 250 MG tablet Generic drug: abiraterone acetate TAKE 4 TABLETS BY MOUTH ONCE DAILY ON AN EMPTY STOMACH WITH 8 OZ WATER AS DIRECTED.  TAKE 1 HOUR BEFORE OR 2 HOURS AFTER A MEAL.DO NOT CRUSH OR CHEW       Allergies: No Known Allergies  Family History  Problem Relation Age of Onset  . Diabetes Mother   . Cancer Father   . Cancer Paternal Uncle        prostate    Social History:  reports that he has been smoking cigarettes. He has a 41.00 pack-year smoking history. He has never used smokeless tobacco. He reports previous alcohol use. He reports that he does not use drugs.  ROS: A complete review of systems was performed.  All systems are negative except for pertinent findings as noted.  Physical Exam:  Vital signs in last 24 hours: There were no vitals taken for this visit. Constitutional:  Alert and oriented, No acute distress Cardiovascular: Regular rate  Respiratory: Normal respiratory effort GI: Abdomen  is soft, nontender, nondistended, no abdominal masses. No CVAT.  Genitourinary: Normal male phallus, testes are descended bilaterally and non-tender and without masses, scrotum is normal in appearance without lesions or masses, perineum is normal on inspection. Lymphatic: No lymphadenopathy Neurologic: Grossly intact, no focal deficits Psychiatric: Normal mood and affect  Laboratory Data:  No results for input(s): WBC, HGB, HCT, PLT in the last 72 hours.  No results for input(s): NA, K, CL, GLUCOSE, BUN, CALCIUM, CREATININE in the last 72 hours.  Invalid input(s): CO3   No results found for this or any previous visit (from the past 24 hour(s)). No results found for this  or any previous visit (from the past 240 hour(s)).  Renal Function: No results for input(s): CREATININE in the last 168 hours. CrCl cannot be calculated (Patient's most recent lab result is older than the maximum 21 days allowed.).  Radiologic Imaging: No results found.  Impression/Assessment:  ***  Plan:  ***

## 2020-02-16 DEATH — deceased
